# Patient Record
Sex: Female | Born: 1960 | Race: White | Hispanic: No | Marital: Married | Smoking: Former smoker
Health system: Southern US, Community
[De-identification: ages and names within clinical notes are randomized; demographics above are authoritative.]

## PROBLEM LIST (undated history)

## (undated) DIAGNOSIS — E785 Hyperlipidemia, unspecified: Secondary | ICD-10-CM

## (undated) DIAGNOSIS — D3A00 Benign carcinoid tumor of unspecified site: Secondary | ICD-10-CM

## (undated) DIAGNOSIS — Z87442 Personal history of urinary calculi: Secondary | ICD-10-CM

## (undated) DIAGNOSIS — T8859XA Other complications of anesthesia, initial encounter: Secondary | ICD-10-CM

## (undated) DIAGNOSIS — I10 Essential (primary) hypertension: Secondary | ICD-10-CM

## (undated) DIAGNOSIS — Z8719 Personal history of other diseases of the digestive system: Secondary | ICD-10-CM

## (undated) DIAGNOSIS — D509 Iron deficiency anemia, unspecified: Secondary | ICD-10-CM

## (undated) DIAGNOSIS — T4145XA Adverse effect of unspecified anesthetic, initial encounter: Secondary | ICD-10-CM

## (undated) DIAGNOSIS — K219 Gastro-esophageal reflux disease without esophagitis: Secondary | ICD-10-CM

## (undated) DIAGNOSIS — L237 Allergic contact dermatitis due to plants, except food: Secondary | ICD-10-CM

## (undated) DIAGNOSIS — K5730 Diverticulosis of large intestine without perforation or abscess without bleeding: Secondary | ICD-10-CM

## (undated) DIAGNOSIS — F419 Anxiety disorder, unspecified: Secondary | ICD-10-CM

## (undated) DIAGNOSIS — K5792 Diverticulitis of intestine, part unspecified, without perforation or abscess without bleeding: Secondary | ICD-10-CM

## (undated) HISTORY — PX: CHOLECYSTECTOMY: SHX55

## (undated) HISTORY — DX: Essential (primary) hypertension: I10

## (undated) HISTORY — PX: OTHER SURGICAL HISTORY: SHX169

## (undated) HISTORY — DX: Hyperlipidemia, unspecified: E78.5

## (undated) HISTORY — DX: Allergic contact dermatitis due to plants, except food: L23.7

## (undated) HISTORY — DX: Benign carcinoid tumor of unspecified site: D3A.00

## (undated) HISTORY — DX: Anxiety disorder, unspecified: F41.9

## (undated) HISTORY — DX: Iron deficiency anemia, unspecified: D50.9

## (undated) HISTORY — PX: ABDOMINAL HYSTERECTOMY: SHX81

## (undated) HISTORY — DX: Diverticulosis of large intestine without perforation or abscess without bleeding: K57.30

---

## 1998-05-08 ENCOUNTER — Encounter: Admission: RE | Admit: 1998-05-08 | Discharge: 1998-08-06 | Payer: Self-pay | Admitting: Gastroenterology

## 2001-08-09 ENCOUNTER — Other Ambulatory Visit: Admission: RE | Admit: 2001-08-09 | Discharge: 2001-08-09 | Payer: Self-pay | Admitting: Obstetrics and Gynecology

## 2001-08-09 ENCOUNTER — Encounter: Payer: Self-pay | Admitting: Obstetrics and Gynecology

## 2001-08-09 ENCOUNTER — Encounter: Admission: RE | Admit: 2001-08-09 | Discharge: 2001-08-09 | Payer: Self-pay | Admitting: Obstetrics and Gynecology

## 2001-11-08 ENCOUNTER — Encounter (INDEPENDENT_AMBULATORY_CARE_PROVIDER_SITE_OTHER): Payer: Self-pay

## 2001-11-08 ENCOUNTER — Observation Stay (HOSPITAL_COMMUNITY): Admission: RE | Admit: 2001-11-08 | Discharge: 2001-11-09 | Payer: Self-pay

## 2002-05-10 ENCOUNTER — Encounter: Payer: Self-pay | Admitting: Emergency Medicine

## 2002-05-10 ENCOUNTER — Emergency Department (HOSPITAL_COMMUNITY): Admission: EM | Admit: 2002-05-10 | Discharge: 2002-05-10 | Payer: Self-pay | Admitting: Emergency Medicine

## 2002-05-26 ENCOUNTER — Ambulatory Visit (HOSPITAL_COMMUNITY): Admission: RE | Admit: 2002-05-26 | Discharge: 2002-05-26 | Payer: Self-pay | Admitting: Internal Medicine

## 2002-05-26 ENCOUNTER — Encounter (INDEPENDENT_AMBULATORY_CARE_PROVIDER_SITE_OTHER): Payer: Self-pay | Admitting: Internal Medicine

## 2002-08-19 ENCOUNTER — Other Ambulatory Visit: Admission: RE | Admit: 2002-08-19 | Discharge: 2002-08-19 | Payer: Self-pay | Admitting: Obstetrics and Gynecology

## 2002-10-03 ENCOUNTER — Ambulatory Visit (HOSPITAL_COMMUNITY): Admission: RE | Admit: 2002-10-03 | Discharge: 2002-10-03 | Payer: Self-pay | Admitting: Pulmonary Disease

## 2003-01-16 ENCOUNTER — Ambulatory Visit (HOSPITAL_COMMUNITY): Admission: RE | Admit: 2003-01-16 | Discharge: 2003-01-16 | Payer: Self-pay | Admitting: Pulmonary Disease

## 2003-07-05 ENCOUNTER — Ambulatory Visit (HOSPITAL_COMMUNITY): Admission: RE | Admit: 2003-07-05 | Discharge: 2003-07-05 | Payer: Self-pay | Admitting: Pulmonary Disease

## 2003-07-05 ENCOUNTER — Encounter (INDEPENDENT_AMBULATORY_CARE_PROVIDER_SITE_OTHER): Payer: Self-pay | Admitting: Internal Medicine

## 2003-08-21 ENCOUNTER — Encounter (INDEPENDENT_AMBULATORY_CARE_PROVIDER_SITE_OTHER): Payer: Self-pay | Admitting: Internal Medicine

## 2003-09-01 ENCOUNTER — Encounter: Admission: RE | Admit: 2003-09-01 | Discharge: 2003-09-01 | Payer: Self-pay | Admitting: Cardiovascular Disease

## 2003-09-05 HISTORY — PX: CARDIAC CATHETERIZATION: SHX172

## 2003-10-30 ENCOUNTER — Encounter (INDEPENDENT_AMBULATORY_CARE_PROVIDER_SITE_OTHER): Payer: Self-pay | Admitting: Internal Medicine

## 2003-12-05 ENCOUNTER — Encounter (INDEPENDENT_AMBULATORY_CARE_PROVIDER_SITE_OTHER): Payer: Self-pay | Admitting: Internal Medicine

## 2003-12-12 ENCOUNTER — Emergency Department (HOSPITAL_COMMUNITY): Admission: EM | Admit: 2003-12-12 | Discharge: 2003-12-12 | Payer: Self-pay | Admitting: Emergency Medicine

## 2003-12-26 ENCOUNTER — Encounter (INDEPENDENT_AMBULATORY_CARE_PROVIDER_SITE_OTHER): Payer: Self-pay | Admitting: Internal Medicine

## 2004-01-24 ENCOUNTER — Encounter (INDEPENDENT_AMBULATORY_CARE_PROVIDER_SITE_OTHER): Payer: Self-pay | Admitting: Internal Medicine

## 2004-02-13 ENCOUNTER — Encounter (INDEPENDENT_AMBULATORY_CARE_PROVIDER_SITE_OTHER): Payer: Self-pay | Admitting: Internal Medicine

## 2004-02-22 ENCOUNTER — Encounter (INDEPENDENT_AMBULATORY_CARE_PROVIDER_SITE_OTHER): Payer: Self-pay | Admitting: Internal Medicine

## 2004-03-15 ENCOUNTER — Encounter (INDEPENDENT_AMBULATORY_CARE_PROVIDER_SITE_OTHER): Payer: Self-pay | Admitting: Internal Medicine

## 2004-04-15 ENCOUNTER — Ambulatory Visit (HOSPITAL_COMMUNITY): Admission: RE | Admit: 2004-04-15 | Discharge: 2004-04-15 | Payer: Self-pay | Admitting: Pulmonary Disease

## 2004-05-17 ENCOUNTER — Ambulatory Visit (HOSPITAL_COMMUNITY): Admission: RE | Admit: 2004-05-17 | Discharge: 2004-05-17 | Payer: Self-pay | Admitting: Pulmonary Disease

## 2004-05-17 ENCOUNTER — Encounter (INDEPENDENT_AMBULATORY_CARE_PROVIDER_SITE_OTHER): Payer: Self-pay | Admitting: Internal Medicine

## 2004-05-19 ENCOUNTER — Encounter (INDEPENDENT_AMBULATORY_CARE_PROVIDER_SITE_OTHER): Payer: Self-pay | Admitting: Internal Medicine

## 2004-07-02 ENCOUNTER — Encounter (INDEPENDENT_AMBULATORY_CARE_PROVIDER_SITE_OTHER): Payer: Self-pay | Admitting: Internal Medicine

## 2004-11-18 ENCOUNTER — Other Ambulatory Visit: Admission: RE | Admit: 2004-11-18 | Discharge: 2004-11-18 | Payer: Self-pay | Admitting: Obstetrics and Gynecology

## 2004-12-23 ENCOUNTER — Encounter (INDEPENDENT_AMBULATORY_CARE_PROVIDER_SITE_OTHER): Payer: Self-pay | Admitting: Internal Medicine

## 2004-12-23 ENCOUNTER — Ambulatory Visit (HOSPITAL_COMMUNITY): Admission: RE | Admit: 2004-12-23 | Discharge: 2004-12-23 | Payer: Self-pay | Admitting: Obstetrics and Gynecology

## 2005-02-11 ENCOUNTER — Ambulatory Visit (HOSPITAL_COMMUNITY): Admission: RE | Admit: 2005-02-11 | Discharge: 2005-02-11 | Payer: Self-pay | Admitting: Pulmonary Disease

## 2005-02-11 ENCOUNTER — Encounter (INDEPENDENT_AMBULATORY_CARE_PROVIDER_SITE_OTHER): Payer: Self-pay | Admitting: Internal Medicine

## 2005-02-17 ENCOUNTER — Ambulatory Visit: Payer: Self-pay | Admitting: Orthopedic Surgery

## 2005-10-09 ENCOUNTER — Ambulatory Visit: Payer: Self-pay | Admitting: Internal Medicine

## 2005-10-10 ENCOUNTER — Encounter (INDEPENDENT_AMBULATORY_CARE_PROVIDER_SITE_OTHER): Payer: Self-pay | Admitting: Internal Medicine

## 2005-10-14 ENCOUNTER — Encounter (INDEPENDENT_AMBULATORY_CARE_PROVIDER_SITE_OTHER): Payer: Self-pay | Admitting: Internal Medicine

## 2005-10-17 ENCOUNTER — Ambulatory Visit: Payer: Self-pay | Admitting: Internal Medicine

## 2005-11-21 ENCOUNTER — Ambulatory Visit: Payer: Self-pay | Admitting: Internal Medicine

## 2006-04-22 ENCOUNTER — Ambulatory Visit: Payer: Self-pay | Admitting: Internal Medicine

## 2006-04-29 ENCOUNTER — Encounter: Payer: Self-pay | Admitting: Internal Medicine

## 2006-04-29 DIAGNOSIS — K589 Irritable bowel syndrome without diarrhea: Secondary | ICD-10-CM

## 2006-04-29 DIAGNOSIS — F410 Panic disorder [episodic paroxysmal anxiety] without agoraphobia: Secondary | ICD-10-CM | POA: Insufficient documentation

## 2006-04-29 DIAGNOSIS — K219 Gastro-esophageal reflux disease without esophagitis: Secondary | ICD-10-CM | POA: Insufficient documentation

## 2006-04-29 DIAGNOSIS — E785 Hyperlipidemia, unspecified: Secondary | ICD-10-CM | POA: Insufficient documentation

## 2006-04-29 DIAGNOSIS — J309 Allergic rhinitis, unspecified: Secondary | ICD-10-CM | POA: Insufficient documentation

## 2006-04-29 DIAGNOSIS — N200 Calculus of kidney: Secondary | ICD-10-CM

## 2006-04-29 DIAGNOSIS — I10 Essential (primary) hypertension: Secondary | ICD-10-CM | POA: Insufficient documentation

## 2006-04-29 DIAGNOSIS — F411 Generalized anxiety disorder: Secondary | ICD-10-CM | POA: Insufficient documentation

## 2006-06-12 ENCOUNTER — Ambulatory Visit: Payer: Self-pay | Admitting: Internal Medicine

## 2006-07-02 ENCOUNTER — Ambulatory Visit: Payer: Self-pay | Admitting: Internal Medicine

## 2006-07-09 ENCOUNTER — Encounter (INDEPENDENT_AMBULATORY_CARE_PROVIDER_SITE_OTHER): Payer: Self-pay | Admitting: Internal Medicine

## 2007-01-07 ENCOUNTER — Telehealth (INDEPENDENT_AMBULATORY_CARE_PROVIDER_SITE_OTHER): Payer: Self-pay | Admitting: Internal Medicine

## 2007-01-07 ENCOUNTER — Telehealth (INDEPENDENT_AMBULATORY_CARE_PROVIDER_SITE_OTHER): Payer: Self-pay | Admitting: *Deleted

## 2007-01-07 ENCOUNTER — Ambulatory Visit: Payer: Self-pay | Admitting: Internal Medicine

## 2007-04-01 ENCOUNTER — Ambulatory Visit: Payer: Self-pay | Admitting: Internal Medicine

## 2007-05-19 ENCOUNTER — Ambulatory Visit: Payer: Self-pay | Admitting: Internal Medicine

## 2007-06-23 ENCOUNTER — Telehealth (INDEPENDENT_AMBULATORY_CARE_PROVIDER_SITE_OTHER): Payer: Self-pay | Admitting: *Deleted

## 2007-06-23 ENCOUNTER — Ambulatory Visit: Payer: Self-pay | Admitting: Internal Medicine

## 2007-06-23 ENCOUNTER — Ambulatory Visit (HOSPITAL_COMMUNITY): Admission: RE | Admit: 2007-06-23 | Discharge: 2007-06-23 | Payer: Self-pay | Admitting: Internal Medicine

## 2007-06-24 ENCOUNTER — Encounter (INDEPENDENT_AMBULATORY_CARE_PROVIDER_SITE_OTHER): Payer: Self-pay | Admitting: Internal Medicine

## 2007-07-16 ENCOUNTER — Telehealth (INDEPENDENT_AMBULATORY_CARE_PROVIDER_SITE_OTHER): Payer: Self-pay | Admitting: *Deleted

## 2007-07-26 ENCOUNTER — Encounter (INDEPENDENT_AMBULATORY_CARE_PROVIDER_SITE_OTHER): Payer: Self-pay | Admitting: Internal Medicine

## 2008-01-11 ENCOUNTER — Ambulatory Visit (HOSPITAL_COMMUNITY): Admission: RE | Admit: 2008-01-11 | Discharge: 2008-01-11 | Payer: Self-pay | Admitting: Internal Medicine

## 2008-01-11 ENCOUNTER — Ambulatory Visit: Payer: Self-pay | Admitting: Internal Medicine

## 2008-01-11 LAB — CONVERTED CEMR LAB
Basophils Absolute: 0 10*3/uL (ref 0.0–0.1)
Basophils Relative: 0 % (ref 0–1)
Bilirubin Urine: NEGATIVE
Chloride: 98 meq/L (ref 96–112)
Eosinophils Relative: 1 % (ref 0–5)
Glucose, Bld: 109 mg/dL — ABNORMAL HIGH (ref 70–99)
HCT: 37.9 % (ref 36.0–46.0)
Hemoglobin: 12.7 g/dL (ref 12.0–15.0)
Ketones, urine, test strip: NEGATIVE
Lymphocytes Relative: 28 % (ref 12–46)
Lymphs Abs: 2.7 10*3/uL (ref 0.7–4.0)
Monocytes Absolute: 0.9 10*3/uL (ref 0.1–1.0)
Monocytes Relative: 9 % (ref 3–12)
Neutro Abs: 5.9 10*3/uL (ref 1.7–7.7)
Neutrophils Relative %: 61 % (ref 43–77)
Potassium: 3.3 meq/L — ABNORMAL LOW (ref 3.5–5.3)
Protein, U semiquant: NEGATIVE
RDW: 14.1 % (ref 11.5–15.5)
Sodium: 137 meq/L (ref 135–145)
Total Bilirubin: 0.4 mg/dL (ref 0.3–1.2)
Total Protein: 6.9 g/dL (ref 6.0–8.3)

## 2008-01-12 ENCOUNTER — Telehealth (INDEPENDENT_AMBULATORY_CARE_PROVIDER_SITE_OTHER): Payer: Self-pay | Admitting: *Deleted

## 2008-02-08 ENCOUNTER — Telehealth (INDEPENDENT_AMBULATORY_CARE_PROVIDER_SITE_OTHER): Payer: Self-pay | Admitting: *Deleted

## 2008-02-17 ENCOUNTER — Ambulatory Visit: Payer: Self-pay | Admitting: Internal Medicine

## 2008-03-27 ENCOUNTER — Ambulatory Visit: Payer: Self-pay | Admitting: Internal Medicine

## 2008-04-20 ENCOUNTER — Ambulatory Visit: Payer: Self-pay | Admitting: Internal Medicine

## 2008-04-26 ENCOUNTER — Telehealth (INDEPENDENT_AMBULATORY_CARE_PROVIDER_SITE_OTHER): Payer: Self-pay | Admitting: *Deleted

## 2008-05-23 ENCOUNTER — Ambulatory Visit: Payer: Self-pay | Admitting: Family Medicine

## 2008-06-27 ENCOUNTER — Ambulatory Visit: Payer: Self-pay | Admitting: Family Medicine

## 2008-09-11 ENCOUNTER — Encounter (INDEPENDENT_AMBULATORY_CARE_PROVIDER_SITE_OTHER): Payer: Self-pay | Admitting: Internal Medicine

## 2008-10-10 HISTORY — PX: TRANSTHORACIC ECHOCARDIOGRAM: SHX275

## 2008-10-30 ENCOUNTER — Encounter (INDEPENDENT_AMBULATORY_CARE_PROVIDER_SITE_OTHER): Payer: Self-pay | Admitting: Internal Medicine

## 2008-12-28 ENCOUNTER — Encounter (INDEPENDENT_AMBULATORY_CARE_PROVIDER_SITE_OTHER): Payer: Self-pay | Admitting: Internal Medicine

## 2009-01-24 ENCOUNTER — Ambulatory Visit: Payer: Self-pay | Admitting: Internal Medicine

## 2009-01-24 DIAGNOSIS — M549 Dorsalgia, unspecified: Secondary | ICD-10-CM | POA: Insufficient documentation

## 2009-01-24 LAB — CONVERTED CEMR LAB
Glucose, Urine, Semiquant: NEGATIVE
Specific Gravity, Urine: 1.015
Urobilinogen, UA: 0.2
pH: 7.5

## 2009-03-30 HISTORY — PX: OTHER SURGICAL HISTORY: SHX169

## 2009-04-02 ENCOUNTER — Encounter: Payer: Self-pay | Admitting: Family Medicine

## 2009-05-08 ENCOUNTER — Ambulatory Visit: Payer: Self-pay | Admitting: Family Medicine

## 2009-05-08 DIAGNOSIS — M542 Cervicalgia: Secondary | ICD-10-CM | POA: Insufficient documentation

## 2009-05-09 ENCOUNTER — Telehealth: Payer: Self-pay | Admitting: Family Medicine

## 2009-05-15 ENCOUNTER — Ambulatory Visit: Payer: Self-pay | Admitting: Family Medicine

## 2009-05-15 ENCOUNTER — Telehealth: Payer: Self-pay | Admitting: Family Medicine

## 2009-05-15 DIAGNOSIS — N63 Unspecified lump in unspecified breast: Secondary | ICD-10-CM | POA: Insufficient documentation

## 2009-05-17 ENCOUNTER — Encounter: Payer: Self-pay | Admitting: Family Medicine

## 2009-05-17 ENCOUNTER — Telehealth: Payer: Self-pay | Admitting: Family Medicine

## 2009-05-17 ENCOUNTER — Encounter: Admission: RE | Admit: 2009-05-17 | Discharge: 2009-05-17 | Payer: Self-pay | Admitting: Family Medicine

## 2009-07-23 ENCOUNTER — Encounter: Payer: Self-pay | Admitting: Physician Assistant

## 2009-07-23 ENCOUNTER — Ambulatory Visit: Payer: Self-pay | Admitting: Family Medicine

## 2009-07-23 DIAGNOSIS — J209 Acute bronchitis, unspecified: Secondary | ICD-10-CM | POA: Insufficient documentation

## 2009-07-23 DIAGNOSIS — J0100 Acute maxillary sinusitis, unspecified: Secondary | ICD-10-CM

## 2009-09-17 ENCOUNTER — Telehealth: Payer: Self-pay | Admitting: Family Medicine

## 2009-09-27 ENCOUNTER — Ambulatory Visit: Payer: Self-pay | Admitting: Family Medicine

## 2009-09-27 ENCOUNTER — Encounter: Payer: Self-pay | Admitting: Physician Assistant

## 2009-09-27 DIAGNOSIS — K5289 Other specified noninfective gastroenteritis and colitis: Secondary | ICD-10-CM | POA: Insufficient documentation

## 2009-10-03 ENCOUNTER — Telehealth: Payer: Self-pay | Admitting: Physician Assistant

## 2009-11-27 ENCOUNTER — Encounter: Admission: RE | Admit: 2009-11-27 | Discharge: 2009-11-27 | Payer: Self-pay | Admitting: Family Medicine

## 2010-01-17 ENCOUNTER — Encounter: Payer: Self-pay | Admitting: Family Medicine

## 2010-01-17 ENCOUNTER — Telehealth: Payer: Self-pay | Admitting: Physician Assistant

## 2010-04-02 ENCOUNTER — Encounter: Payer: Self-pay | Admitting: Family Medicine

## 2010-04-08 ENCOUNTER — Telehealth: Payer: Self-pay | Admitting: Family Medicine

## 2010-04-18 ENCOUNTER — Encounter: Payer: Self-pay | Admitting: Family Medicine

## 2010-05-02 ENCOUNTER — Encounter: Payer: Self-pay | Admitting: Family Medicine

## 2010-05-02 ENCOUNTER — Ambulatory Visit: Payer: Self-pay | Admitting: Family Medicine

## 2010-05-02 DIAGNOSIS — R079 Chest pain, unspecified: Secondary | ICD-10-CM

## 2010-05-03 ENCOUNTER — Encounter: Payer: Self-pay | Admitting: Family Medicine

## 2010-05-06 ENCOUNTER — Telehealth: Payer: Self-pay | Admitting: Family Medicine

## 2010-05-06 ENCOUNTER — Encounter: Payer: Self-pay | Admitting: Family Medicine

## 2010-05-07 ENCOUNTER — Encounter: Payer: Self-pay | Admitting: Family Medicine

## 2010-05-07 LAB — CONVERTED CEMR LAB
ALT: 17 units/L (ref 0–35)
Albumin: 4.5 g/dL (ref 3.5–5.2)
Alkaline Phosphatase: 80 units/L (ref 39–117)
Basophils Absolute: 0 10*3/uL (ref 0.0–0.1)
Basophils Relative: 0 % (ref 0–1)
Cholesterol: 197 mg/dL (ref 0–200)
Eosinophils Absolute: 0.1 10*3/uL (ref 0.0–0.7)
Eosinophils Relative: 1 % (ref 0–5)
Glucose, Bld: 102 mg/dL — ABNORMAL HIGH (ref 70–99)
HCT: 38.3 % (ref 36.0–46.0)
Helicobacter Pylori Antibody-IgG: <0.4
Indirect Bilirubin: 0.4 mg/dL (ref 0.0–0.9)
MCV: 86.7 fL (ref 78.0–100.0)
Monocytes Relative: 9 % (ref 3–12)
Neutro Abs: 5 10*3/uL (ref 1.7–7.7)
Neutrophils Relative %: 53 % (ref 43–77)
Platelets: 389 10*3/uL (ref 150–400)
RDW: 14.1 % (ref 11.5–15.5)
Sodium: 140 meq/L (ref 135–145)
TSH: 2.084 microintl units/mL (ref 0.350–4.500)
Total Bilirubin: 0.5 mg/dL (ref 0.3–1.2)
Total Protein: 6.7 g/dL (ref 6.0–8.3)
VLDL: 35 mg/dL (ref 0–40)
WBC: 9.5 10*3/uL (ref 4.0–10.5)

## 2010-05-08 ENCOUNTER — Encounter: Payer: Self-pay | Admitting: Family Medicine

## 2010-05-08 LAB — CONVERTED CEMR LAB: Hgb A1c MFr Bld: 6.3 % — ABNORMAL HIGH (ref <5.7)

## 2010-05-17 ENCOUNTER — Telehealth: Payer: Self-pay | Admitting: Family Medicine

## 2010-05-19 DIAGNOSIS — K5730 Diverticulosis of large intestine without perforation or abscess without bleeding: Secondary | ICD-10-CM

## 2010-05-19 DIAGNOSIS — L237 Allergic contact dermatitis due to plants, except food: Secondary | ICD-10-CM

## 2010-05-19 HISTORY — DX: Allergic contact dermatitis due to plants, except food: L23.7

## 2010-05-19 HISTORY — DX: Diverticulosis of large intestine without perforation or abscess without bleeding: K57.30

## 2010-05-21 ENCOUNTER — Ambulatory Visit (HOSPITAL_COMMUNITY)
Admission: RE | Admit: 2010-05-21 | Discharge: 2010-05-21 | Payer: Self-pay | Source: Home / Self Care | Attending: Family Medicine | Admitting: Family Medicine

## 2010-06-09 ENCOUNTER — Encounter: Payer: Self-pay | Admitting: Family Medicine

## 2010-06-13 ENCOUNTER — Ambulatory Visit
Admission: RE | Admit: 2010-06-13 | Discharge: 2010-06-13 | Payer: Self-pay | Source: Home / Self Care | Attending: Family Medicine | Admitting: Family Medicine

## 2010-06-13 DIAGNOSIS — J40 Bronchitis, not specified as acute or chronic: Secondary | ICD-10-CM | POA: Insufficient documentation

## 2010-06-16 LAB — CONVERTED CEMR LAB: WBC, blood: 7.1 10*3/uL

## 2010-06-20 NOTE — Letter (Signed)
Summary: LAB ADD ON  LAB ADD ON   Imported By: Lind Guest 05/08/2010 08:39:55  _____________________________________________________________________  External Attachment:    Type:   Image     Comment:   External Document

## 2010-06-20 NOTE — Progress Notes (Signed)
  Phone Note Other Incoming   Caller: dr simpson Summary of Call: pls let pt know EKG shows no significant change from before, she does NOT NEED to to see the cardiologist Initial call taken by: Syliva Overman MD,  May 06, 2010 9:19 AM  Follow-up for Phone Call        left patient detailed message Follow-up by: Adella Hare LPN,  May 07, 2010 10:27 AM

## 2010-06-20 NOTE — Assessment & Plan Note (Signed)
Summary: sick- room 3   Vital Signs:  Patient profile:   50 year old female Height:      62 inches Weight:      225 pounds BMI:     41.30 O2 Sat:      96 % on Room air Pulse rate:   86 / minute Resp:     16 per minute BP sitting:   120 / 70  (left arm)  Vitals Entered By: Adella Hare LPN (Sep 27, 2009 8:46 AM) CC: body aches, headache, fever, nausea, diarrhea Is Patient Diabetic? No Pain Assessment Patient in pain? no      Comments did not bring medication to ov   Primary Provider:  Syliva Overman MD  CC:  body aches, headache, fever, nausea, and diarrhea.  History of Present Illness: Pt is here today with c/o nausea, fever, and headache.  Was a little nauseated on Mon 5/9 but went away  until Tues night then HA started and nausea returned.  Was up most of the night due to nausea.  No vomiting.  Has been running a low grade temp 99 - 100.4.  Body aches. Stools loose.  Taking Ibuprofen for HA and body aches.  Not much help with Headache on Tues night.    Allergies (verified): No Known Drug Allergies  Past History:  Past medical history reviewed for relevance to current acute and chronic problems.  Past Medical History: Reviewed history from 05/08/2009 and no changes required. Allergic rhinitis  was on immunotherapy Anxiety GERD Hyperlipidemia Hypertension nephrolithiasis depression in the pasrt Obesity Back pain with sciatica  2009  Review of Systems General:  Complains of fever; denies chills. ENT:  Denies earache, nasal congestion, and sore throat. Resp:  Denies cough and shortness of breath. GI:  Complains of diarrhea, loss of appetite, and nausea; denies abdominal pain, bloody stools, dark tarry stools, and vomiting. GU:  Denies dysuria and urinary frequency.  Physical Exam  General:  Well-developed,well-nourished,in no acute distress; alert,appropriate and cooperative throughout examination Head:  Normocephalic and atraumatic without obvious  abnormalities. No apparent alopecia or balding. Ears:  External ear exam shows no significant lesions or deformities.  Otoscopic examination reveals clear canals, tympanic membranes are intact bilaterally without bulging, retraction, inflammation or discharge. Hearing is grossly normal bilaterally. Nose:  External nasal examination shows no deformity or inflammation. Nasal mucosa are pink and moist without lesions or exudates. Mouth:  Oral mucosa and oropharynx without lesions or exudates.  Teeth in good repair. Neck:  No deformities, masses, or tenderness noted. Lungs:  Normal respiratory effort, chest expands symmetrically. Lungs are clear to auscultation, no crackles or wheezes. Heart:  Normal rate and regular rhythm. S1 and S2 normal without gallop, murmur, click, rub or other extra sounds. Abdomen:  No BS x 4 .  Abd soft, no masses, no HSM.  Pt does report TTP bilat LQ.  No guarding, rebound, or rigidity.    Impression & Recommendations:  Problem # 1:  GASTROENTERITIS, ACUTE (ICD-558.9) Assessment New  Orders: Zofran 1mg . injection (Z6109) Admin of Therapeutic Inj  intramuscular or subcutaneous (60454)  Complete Medication List: 1)  Diovan 160 Mg Tabs (Valsartan) .... Once daily 2)  Nexium 40 Mg Pack (Esomeprazole magnesium) .Marland Kitchen.. 1 by mouth every morning 3)  Aspirin 81 Mg Tbec (Aspirin) .... Once daily 4)  Lipitor 10 Mg Tabs (Atorvastatin calcium) .... Once daily 5)  Folic Acid 1 Mg Tabs (Folic acid) .... One tab by mouth qhs 6)  Ibuprofen 800 Mg  Tabs (Ibuprofen) .... Take 1 tablet by mouth three times a day 7)  Cyclobenzaprine Hcl 10 Mg Tabs (Cyclobenzaprine hcl) .... Take 1 tab by mouth at bedtime as needed 8)  Alprazolam 0.25 Mg Tabs (Alprazolam) .... Two times a day as needed 9)  Phentermine Hcl 37.5 Mg Caps (Phentermine hcl) .... Take 1 tablet by mouth once a day  (take 1/2 tab once daily ) 10)  Zithromax Z-pak 250 Mg Tabs (Azithromycin) .... As directed 11)   Promethazine-codeine 6.25-10 Mg/58ml Syrp (Promethazine-codeine) .Marland Kitchen.. 1-2 tsp q 6 hrs as needed cough 12)  Promethazine Hcl 25 Mg Tabs (Promethazine hcl) .... Take 1 every 6- 8 hrs as needed for nausea 13)  Tramadol Hcl 50 Mg Tabs (Tramadol hcl) .... Take 1 every 6 hrs as needed for headache  Patient Instructions: 1)  Please schedule a follow-up appointment as needed. 2)  Watch for increased fever, abdominal pain, or other worsening of symptoms.  If this develops please notify us right away. 3)  Increase clear fluids to avoid dehydration. 4)  You have received a shot of Zofran today for nausea. 5)  I have prescribed Phenergan to use at home as needed for nausea. 6)  I have prescribed Tramadol for headaches and body aches.  Continue Ibuprofen and use this in addition if needed. Prescriptions: TRAMADOL HCL 50 MG TABS (TRAMADOL HCL) take 1 every 6 hrs as needed for Headache  #20 x 0   Entered and Authorized by:   Esperanza Sheets PA   Signed by:   Esperanza Sheets PA on 09/27/2009   Method used:   Electronically to        [facility] [phone]* (retail)       [address]       [place], [place]  [address]       Ph: [phone] or [phone]       Fax: [phone]   RxID:   [id number] PROMETHAZINE HCL 25 MG TABS (PROMETHAZINE HCL) take 1 every 6- 8 hrs as needed for nausea  #20 x 0   Entered and Authorized by:   Esperanza Sheets PA   Signed by:   Esperanza Sheets PA on 09/27/2009   Method used:   Electronically to        [facility] [phone]* (retail)       [address]       [place], [place]  [address]       Ph: [phone] or [phone]       Fax: [phone]   RxID:   [id number]    Medication Administration  Injection # 1:    Medication: Zofran 1mg . injection    Diagnosis: GASTROENTERITIS, ACUTE (ICD-558.9)    Route: IM    Site: RUOQ gluteus    Exp Date: 10/12    Lot #: [id number]    Mfr: baxter    Comments: 4mg  given     Patient tolerated  injection without complications    Given by: Everitt Amber LPN (Sep 27, 2009 9:34 AM)  Orders Added: 1)  Zofran 1mg . injection [J2405] 2)  Admin of Therapeutic Inj  intramuscular or subcutaneous [96372] 3)  Est. Patient Level IV [31517]

## 2010-06-20 NOTE — Letter (Signed)
Summary: Out of Work  [facility]  [address]   [place], [place] [address]   Phone: [phone]  Fax: [phone]    July 23, 2009   Employee:  TIRA LAFFERTY    To Whom It May Concern:   For Medical reasons, please excuse the above named employee from work for the following dates:  Start:   07/23/09  End:   07/26/09 may return to work without restriction.  If you need additional information, please feel free to contact our office.         Sincerely,    Esperanza Sheets PA

## 2010-06-20 NOTE — Progress Notes (Signed)
Summary: MEDICINE  Phone Note Call from Patient   Summary of Call: NEEDS A RX FOR HER BP MEDICINE DIOVAN W- HCTZ 160-12.5. She takes one tab daily.  [facility] has a "half-tab" program where she can get her meds Diovan 320/25 1/2 tab daily (90 day supply) and it saves them money.  If you would be willing to write it like that just let me know and I can fax [facility] to [facility] for her. Thanks CALL [facility] BACK AT [phone]  POUND 123  Initial call taken by: Lind Guest,  January 17, 2010 8:59 AM  Follow-up for Phone Call        It is OK to make this change.  Rx #45 tabs take 1/2 tab once daily for blood pressure. No rf.  Pt needs f/u appt for BP check etc. in Sept. Follow-up by: Esperanza Sheets PA,  January 17, 2010 2:22 PM  Additional Follow-up for Phone Call Additional follow up Details #1::        Faxed in  Additional Follow-up by: Everitt Amber LPN,  January 17, 2010 2:49 PM    New/Updated Medications: DIOVAN HCT 320-25 MG TABS (VALSARTAN-HYDROCHLOROTHIAZIDE) Take one-half tab once daily Prescriptions: DIOVAN HCT 320-25 MG TABS (VALSARTAN-HYDROCHLOROTHIAZIDE) Take one-half tab once daily  #45 x 0   Entered by:   Everitt Amber LPN   Authorized by:   Esperanza Sheets PA   Signed by:   Everitt Amber LPN on 87/56/4332   Method used:   Faxed to ...       [facility] [employer] education [facility])       [address] [address]       [place], [place]  [address]       Ph: [phone]       Fax: [phone]   RxID:   [id number]

## 2010-06-20 NOTE — Letter (Signed)
Summary: MEDICAL RELEASE  MEDICAL RELEASE   Imported By: Lind Guest 01/17/2010 13:59:25  _____________________________________________________________________  External Attachment:    Type:   Image     Comment:   External Document

## 2010-06-20 NOTE — Letter (Signed)
[facility]   Imported By: Lind Guest 05/06/2010 09:19:02  _____________________________________________________________________  External Attachment:    Type:   Image     Comment:   External Document

## 2010-06-20 NOTE — Progress Notes (Signed)
  Phone Note Call from Patient   Caller: Patient Summary of Call: patient called and states she is on diovan hctz 12.5 but only plain diovan was sent in  which is what is on her med list  wants to know if we can send in the hctz Initial call taken by: Adella Hare LPN,  Oct 03, 2009 4:32 PM  Follow-up for Phone Call        Rx Called In Follow-up by: Esperanza Sheets PA,  Oct 03, 2009 5:43 PM    New/Updated Medications: HYDROCHLOROTHIAZIDE 12.5 MG CAPS (HYDROCHLOROTHIAZIDE) take 1 daily Prescriptions: HYDROCHLOROTHIAZIDE 12.5 MG CAPS (HYDROCHLOROTHIAZIDE) take 1 daily  #90 x 1   Entered and Authorized by:   Esperanza Sheets PA   Signed by:   Esperanza Sheets PA on 10/03/2009   Method used:   Electronically to        [facility] [phone]* (retail)       [address]       [place], [place]  [address]       Ph: [phone] or [phone]       Fax: [phone]   RxID:   [id number]

## 2010-06-20 NOTE — Progress Notes (Signed)
Summary: MEDICINE  Phone Note Call from Patient   Summary of Call: DR TOLD HER THAT SHE DO HER DIVAN AND NEXIUM MEDICINE SHE WANTS RX FOR THESE AND ALSO THE APPT. PILL ANOTHER 30 DAY SUPPLY THE BP AND NEXIUM 90 DAY SUPPY WANTS TO PICK UP RX SHE MAILS THEM OFF CALL BACK AT [phone] EXT. # [phone] OR CELL # [phone] AND LEAVE MESSAGE IF NO ONE ANSWERS Initial call taken by: Lind Guest,  Sep 17, 2009 10:41 AM  Follow-up for Phone Call        returned call, unavailable Follow-up by: Adella Hare LPN,  Sep 17, 1608 12:09 PM  Additional Follow-up for Phone Call Additional follow up Details #1::        available for pick up, patient aware Additional Follow-up by: Adella Hare LPN,  Sep 18, 9602 1:32 PM    Prescriptions: NEXIUM 40 MG PACK (ESOMEPRAZOLE MAGNESIUM) 1 by mouth every morning  #90 x 1   Entered by:   Adella Hare LPN   Authorized by:   Syliva Overman MD   Signed by:   Adella Hare LPN on 54/01/8118   Method used:   Printed then faxed to ...       [address] [phone]* (retail)       [address]       [place], [place]  [address]       Ph: [phone] or [phone]       Fax: [phone]   RxID:   [id number] DIOVAN 160 MG TABS (VALSARTAN) once daily  #90 x 1   Entered by:   Adella Hare LPN   Authorized by:   Syliva Overman MD   Signed by:   Adella Hare LPN on 87/56/4332   Method used:   Printed then faxed to ...       [address] [phone]* (retail)       [address]       [place], [place]  [address]       Ph: [phone] or [phone]       Fax: [phone]   RxID:   [id number]

## 2010-06-20 NOTE — Progress Notes (Signed)
Summary: medication  Phone Note Call from Patient   Summary of Call: patient request refill for Nexium 40mg  and Diovin W/hctz 320/25mg  it is a 90 day prescription pt stated that she is out please call her when it is filled call cell [phone] leave message would like to put refills on them so will not be worring [place] Initial call taken by: Eugenio Hoes,  April 08, 2010 8:51 AM  Follow-up for Phone Call        Rx Called In, left message Follow-up by: Adella Hare LPN,  April 09, 2010 2:39 PM    Prescriptions: DIOVAN HCT 320-25 MG TABS (VALSARTAN-HYDROCHLOROTHIAZIDE) Take one-half tab once daily  #45 x 0   Entered by:   Adella Hare LPN   Authorized by:   Syliva Overman MD   Signed by:   Adella Hare LPN on 81/19/1478   Method used:   Electronically to        [facility]* (mail-order)             , [place]         Ph: [phone]       Fax: [phone]   RxID:   [id number] NEXIUM 40 MG CPDR (ESOMEPRAZOLE MAGNESIUM) 1 tab q am  #90 x 0   Entered by:   Adella Hare LPN   Authorized by:   Syliva Overman MD   Signed by:   Adella Hare LPN on 66/44/0347   Method used:   Electronically to        [facility]* (mail-order)             , [place]         Ph: [phone]       Fax: [phone]   RxID:   [id number]

## 2010-06-20 NOTE — Letter (Signed)
Summary: MEDICAL RELEASE  MEDICAL RELEASE   Imported By: Lind Guest 05/03/2010 08:33:34  _____________________________________________________________________  External Attachment:    Type:   Image     Comment:   External Document

## 2010-06-20 NOTE — Assessment & Plan Note (Signed)
Summary: OV   Vital Signs:  Patient profile:   50 year old female Height:      62 inches Weight:      228.50 pounds O2 Sat:      96 % on Room air Pulse rate:   73 / minute Pulse rhythm:   regular BP sitting:   120 / 82  (right arm)  Vitals Entered By: Adella Hare LPN (May 02, 2010 1:59 PM)  O2 Flow:  Room air   Primary Care Jaylanni Eltringham:  Syliva Overman MD   History of Present Illness: Reports  that she has not been doing very well. Denies recent fever or chills. Denies sinus pressure, nasal congestion , ear pain or sore throat. Denies chest congestion, or cough productive of sputum.  Denies abdominal pain, nausea, vomitting, diarrhea or constipation. Denies change in bowel movements or bloody stool. Denies dysuria , frequency, incontinence or hesitancy. Denies  joint pain, swelling, or reduced mobility. Denies headaches, vertigo, seizures. Denies depression,  Denies  rash, lesions, or itch.     Allergies: No Known Drug Allergies  Review of Systems      See HPI General:  Complains of fatigue. Eyes:  Denies blurring and discharge. CV:  Complains of chest pain or discomfort; denies difficulty breathing while lying down, lightheadness, near fainting, shortness of breath with exertion, and swelling of feet; intermiitent left chest tightness primarily  associated with stress which is overwhelming, has had cardiology eval in recent past, less than 2 yrs ago . GI:  Complains of abdominal pain, gas, and indigestion; epiagastric pain inc in the past 2 months uncontrolled heartburn despite nexium has tried multiple otherinc aciphex, prilosec and nexium current, has had upper endo more than once. Psych:  Complains of anxiety; denies depression, suicidal thoughts/plans, thoughts of violence, and unusual visions or sounds; overwhelmed with workload, stressed. Endo:  Denies cold intolerance, excessive hunger, excessive thirst, excessive urination, and heat intolerance. Heme:   Denies abnormal bruising and bleeding. Allergy:  Denies hives or rash and itching eyes.  Physical Exam  General:  Well-developed,well-nourished,in no acute distress; alert,appropriate and cooperative throughout examination HEENT: No facial asymmetry,  EOMI, No sinus tenderness, TM's Clear, oropharynx  pink and moist.   Chest: Clear to auscultation bilaterally.  CVS: S1, S2, No murmurs, No S3.   Abd: Soft, Nontender.  MS: Adequate ROM spine, hips, shoulders and knees.  Ext: No edema.   CNS: CN 2-12 intact, power tone and sensation normal throughout.   Skin: Intact, no visible lesions or rashes.  Psych: Good eye contact, normal affect.  Memory intact, anxious not depressed appearing.    Impression & Recommendations:  Problem # 1:  CHEST PAIN UNSPECIFIED (ICD-786.50) Assessment Comment Only  Orders: EKG w/ Interpretation (93000)NSR no significasnt  ischemia , NSR, will have card to look at it  Problem # 2:  HYPERTENSION (ICD-401.9) Assessment: Unchanged  Her updated medication list for this problem includes:    Diovan Hct 320-25 Mg Tabs (Valsartan-hydrochlorothiazide) .Marland Kitchen... Take one-half tab once daily  Orders: T-Basic Metabolic Panel [phone])  BP today: 120/82 Prior BP: 120/70 (09/27/2009)  Prior 10 Yr Risk Heart Disease: Not enough information (05/23/2008)  Labs Reviewed: K+: 3.3 (01/11/2008) Creat: : 0.57 (01/11/2008)     Problem # 3:  MORBID OBESITY (ICD-278.01) Assessment: Unchanged  Ht: 62 (05/02/2010)   Wt: 228.50 (05/02/2010)   BMI: 41.30 (09/27/2009) therapeutic lifestyle change discussed and encouraged  Problem # 4:  ANXIETY (ICD-300.00) Assessment: Deteriorated  Her updated medication list for  this problem includes:    Alprazolam 0.25 Mg Tabs (Alprazolam) .Marland Kitchen..Marland Kitchen Two times a day as needed  Discussed medication use and relaxation techniques.   Complete Medication List: 1)  Aspirin 81 Mg Tbec (Aspirin) .... Once daily 2)  Lipitor 10 Mg Tabs  (Atorvastatin calcium) .... Once daily 3)  Folic Acid 1 Mg Tabs (Folic acid) .... One tab by mouth qhs 4)  Ibuprofen 800 Mg Tabs (Ibuprofen) .... Take 1 tablet by mouth three times a day 5)  Cyclobenzaprine Hcl 10 Mg Tabs (Cyclobenzaprine hcl) .... Take 1 tab by mouth at bedtime as needed 6)  Alprazolam 0.25 Mg Tabs (Alprazolam) .... Two times a day as needed 7)  Phentermine Hcl 37.5 Mg Caps (Phentermine hcl) .... Take 1 tablet by mouth once a day  (take 1/2 tab once daily ) 8)  Promethazine-codeine 6.25-10 Mg/23ml Syrp (Promethazine-codeine) .Marland Kitchen.. 1-2 tsp q 6 hrs as needed cough 9)  Promethazine Hcl 25 Mg Tabs (Promethazine hcl) .... Take 1 every 6- 8 hrs as needed for nausea 10)  Tramadol Hcl 50 Mg Tabs (Tramadol hcl) .... Take 1 every 6 hrs as needed for headache 11)  Diovan Hct 320-25 Mg Tabs (Valsartan-hydrochlorothiazide) .... Take one-half tab once daily 12)  Dexilant 60 Mg Cpdr (Dexlansoprazole) .... Take 1 capsule by mouth once a day  Other Orders: T-Lipid Profile ([address]-60454) T-Hepatic Function [phone]) T-CBC w/Diff (29562-13086) T-TSH (57846-96295) TLB-H. Pylori Abs(Helicobacter Pylori) (86677-HELICO) Radiology Referral (Radiology)  Patient Instructions: 1)  Please schedule a follow-up appointment in 4 months. 2)  It is important that you exercise regularly at least 30 minutes 5 times a week. If you develop chest pain, have severe difficulty breathing, or feel very tired , stop exercising immediately and seek medical attention. 3)  You need to lose weight. Consider a lower calorie diet and regular exercise.  4)  BMP prior to visit, ICD-9: 5)  Hepatic Panel prior to visit, ICD-9: 6)  Lipid Panel prior to visit, ICD-9:  FASTING PAST DUE 7)  TSH prior to visit, ICD-9: 8)  CBC w/ Diff prior to visit, ICD-9: 9)  H pylori 10)  mAMO  DUE WE WILL SCHED 11)  Stop nexium and try dexilant one daily for gERD Prescriptions: DEXILANT 60 MG CPDR (DEXLANSOPRAZOLE) Take 1 capsule by  mouth once a day  #30 x 0   Entered by:   Adella Hare LPN   Authorized by:   Syliva Overman MD   Signed by:   Adella Hare LPN on 28/41/3244   Method used:   Electronically to        [facility] [phone]* (retail)       [address]       [place], [place]  [address]       Ph: [phone] or [phone]       Fax: [phone]   RxID:   [id number] DEXILANT 60 MG CPDR (DEXLANSOPRAZOLE) Take 1 capsule by mouth once a day  #14 x 0   Entered and Authorized by:   Syliva Overman MD   Signed by:   Syliva Overman MD on 05/02/2010   Method used:   Print then Give to Patient   RxID:   [id number]    Orders Added: 1)  Est. Patient Level IV [[address]315] 2)  EKG w/ Interpretation [93000] 3)  T-Basic Metabolic Panel [80048-22910] 4)  T-Lipid Profile [80061-22930] 5)  T-Hepatic Function [80076-22960] 6)  T-CBC w/Diff [[id number]] 7)  T-TSH [06269-48546]  8)  TLB-H. Pylori Abs(Helicobacter Pylori) [86677-HELICO] 9)  Radiology Referral [Radiology]

## 2010-06-20 NOTE — Progress Notes (Signed)
  Phone Note Call from Patient   Caller: Patient Summary of Call: requesting refill on phentermine Initial call taken by: Adella Hare LPN,  Sep 18, 1306 1:32 PM  Follow-up for Phone Call        needs ov  Follow-up by: Syliva Overman MD,  Sep 17, 2009 3:53 PM  Additional Follow-up for Phone Call Additional follow up Details #1::        called patient, left detailed message Additional Follow-up by: Adella Hare LPN,  Sep 17, 6576 4:04 PM

## 2010-06-20 NOTE — Progress Notes (Signed)
Summary: EKG RESULTS  Phone Note Call from Patient   Summary of Call: PATIENT CALLED WANTED TO KNOW THE RESULT OF EKG AND DID YOU GET IN CONTACT WITH THE CARDIOLOGY PLEASE CALLED PT BACK AT [phone] EXT # [phone] # Initial call taken by: Eugenio Hoes,  May 06, 2010 8:49 AM  Follow-up for Phone Call        see msg already sent Follow-up by: Syliva Overman MD,  May 06, 2010 6:48 PM

## 2010-06-20 NOTE — Letter (Signed)
Summary: medical release  medical release   Imported By: Lind Guest 05/06/2010 09:19:22  _____________________________________________________________________  External Attachment:    Type:   Image     Comment:   External Document

## 2010-06-20 NOTE — Assessment & Plan Note (Signed)
Summary: sick - room 3   Vital Signs:  Patient profile:   50 year old female Height:      62 inches Weight:      226.75 pounds BMI:     41.62 O2 Sat:      98 % on Room air Pulse rate:   97 / minute Resp:     16 per minute BP sitting:   140 / 88  (left arm)  Vitals Entered By: Adella Hare LPN (July 23, 1608 9:02 AM) CC: chest congestion, body aches, fever, chills x 2 days Is Patient Diabetic? No Pain Assessment Patient in pain? no        Primary Provider:  Syliva Overman MD  CC:  chest congestion, body aches, fever, and chills x 2 days.  History of Present Illness: Pt states she developed yesterday a cough & "funny feeling" in her throat.  Today syptoms have worsened.  She is running a low grade temp, body aches, some nasal & sinus congestion & her cough is now sometimes prod with thick yellow phlegm.  She is a nonsmoker.  She has been taking OTC fever reducers, & did try some OTC cough med last night which didn't work well.    Current Medications (verified): 1)  Diovan 160 Mg Tabs (Valsartan) .... Once Daily 2)  Nexium 40 Mg Pack (Esomeprazole Magnesium) .Marland Kitchen.. 1 By Mouth Every Morning 3)  Aspirin 81 Mg Tbec (Aspirin) .... Once Daily 4)  Lipitor 10 Mg Tabs (Atorvastatin Calcium) .... Once Daily 5)  Folic Acid 1 Mg Tabs (Folic Acid) .... One Tab By Mouth Qhs 6)  Ibuprofen 800 Mg Tabs (Ibuprofen) .... Take 1 Tablet By Mouth Three Times A Day 7)  Cyclobenzaprine Hcl 10 Mg Tabs (Cyclobenzaprine Hcl) .... Take 1 Tab By Mouth At Bedtime As Needed 8)  Alprazolam 0.25 Mg Tabs (Alprazolam) .... Two Times A Day As Needed 9)  Phentermine Hcl 37.5 Mg Caps (Phentermine Hcl) .... Take 1 Tablet By Mouth Once A Day  (Take 1/2 Tab Once Daily )  Allergies (verified): No Known Drug Allergies  Past History:  Past medical history reviewed for relevance to current acute and chronic problems.  Past Medical History: Reviewed history from 05/08/2009 and no changes required. 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 ENT:  Complains of earache, nasal congestion, postnasal drainage, and sinus pressure; denies sore throat. CV:  Denies chest pain or discomfort and palpitations. Resp:  Complains of cough and sputum productive; denies shortness of breath and wheezing. GI:  Complains of loss of appetite; denies nausea and vomiting. MS:  Complains of muscle aches. Heme:  Denies enlarge lymph nodes.  Physical Exam  General:  Well-developed,well-nourished,in no acute distress; alert,appropriate and cooperative throughout examination Head:  Normocephalic and atraumatic without obvious abnormalities. No apparent alopecia or balding. Ears:  External ear exam shows no significant lesions or deformities.  Otoscopic examination reveals clear canals, tympanic membranes are intact bilaterally without bulging, retraction, inflammation or discharge. Hearing is grossly normal bilaterally. Nose:  External nasal examination shows no deformity or inflammation. Nasal mucosa are pink and moist without lesions or exudates.no sinus percussion tenderness.   Mouth:  Oral mucosa and oropharynx without lesions or exudates.  Teeth in good repair. Neck:  No deformities, masses, or tenderness noted. Lungs:  normal respiratory effort.  Coarse  BS bilat bases.  No rales or wheeze. Heart:  Normal rate and regular rhythm. S1 and S2 normal without gallop, murmur, click, rub or other extra sounds. Cervical Nodes:  No lymphadenopathy noted Psych:  Cognition and judgment appear intact. Alert and cooperative with normal attention span and concentration. No apparent delusions, illusions, hallucinations   Impression & Recommendations:  Problem # 1:  ACUTE BRONCHITIS (ICD-466.0) Assessment New  Her updated  medication list for this problem includes:    Zithromax Z-pak 250 Mg Tabs (Azithromycin) .Marland Kitchen... As directed    Promethazine-codeine 6.25-10 Mg/12ml Syrp (Promethazine-codeine) .Marland Kitchen... 1-2 tsp q 6 hrs as needed cough  Take antibiotics and other medications as directed. Encouraged to push clear liquids, get enough rest, and take acetaminophen as needed. To be seen in 5-7 days if no improvement, sooner if worse.  Problem # 2:  SINUSITIS, ACUTE (ICD-461.9) Assessment: New  Instructed on treatment. Call if symptoms persist or worsen.   Problem # 3:  HYPERTENSION (ICD-401.9) Assessment: Comment Only  Her updated medication list for this problem includes:    Diovan 160 Mg Tabs (Valsartan) ..... Once daily  Complete Medication List: 1)  Diovan 160 Mg Tabs (Valsartan) .... Once daily 2)  Nexium 40 Mg Pack (Esomeprazole magnesium) .Marland Kitchen.. 1 by mouth every morning 3)  Aspirin 81 Mg Tbec (Aspirin) .... Once daily 4)  Lipitor 10 Mg Tabs (Atorvastatin calcium) .... Once daily 5)  Folic Acid 1 Mg Tabs (Folic acid) .... One tab by mouth qhs 6)  Ibuprofen 800 Mg Tabs (Ibuprofen) .... Take 1 tablet by mouth three times a day 7)  Cyclobenzaprine Hcl 10 Mg Tabs (Cyclobenzaprine hcl) .... Take 1 tab by mouth at bedtime as needed 8)  Alprazolam 0.25 Mg Tabs (Alprazolam) .... Two times a day as needed 9)  Phentermine Hcl 37.5 Mg Caps (Phentermine hcl) .... Take 1 tablet by mouth once a day  (take 1/2 tab once daily ) 10)  Zithromax Z-pak 250 Mg Tabs (Azithromycin) .... As directed 11)  Promethazine-codeine 6.25-10 Mg/41ml Syrp (Promethazine-codeine) .Marland Kitchen.. 1-2 tsp q 6 hrs as needed cough  Patient Instructions: 1)  Please schedule a follow-up appointment as needed. 2)  You need to lose weight. Consider a lower calorie diet and regular exercise.  3)  It is not healthy  for men to drink more than 2-3 drinks per day or for women to drink more than 1-2 drinks per day. 4)  Take over the counter cough and cold medications  only as directed on the package insert. DO NOT take more than recommended . 5)  I have prescribed an antibiotica & cough medicine for you. Prescriptions: PROMETHAZINE-CODEINE 6.25-10 MG/5ML SYRP (PROMETHAZINE-CODEINE) 1-2 tsp q 6 hrs as needed cough  #6 oz x 0   Entered and Authorized by:   Esperanza Sheets PA   Signed by:   Adella Hare LPN on 62/95/2841   Method used:   Printed then faxed to ...       [address] [phone]* (retail)       [address]       [place], [place]  [address]       Ph: [phone] or [phone]       Fax: [phone]   RxID:   [id number] ZITHROMAX Z-PAK 250 MG TABS (AZITHROMYCIN) as directed  #1 pack x 0   Entered and Authorized by:   Esperanza Sheets PA   Signed by:   Adella Hare LPN on  07/23/2009   Method used:   Electronically to        [facility] [phone]* (retail)       [address]       [place], [place]  [address]       Ph: [phone] or [phone]       Fax: [phone]   RxID:   [id number]

## 2010-06-20 NOTE — Progress Notes (Signed)
Summary: sinus  Phone Note Call from Patient   Summary of Call: patient states she has had sinus issues since Monday and wanted to know if you could call her something in for it she asked for an appt. but I told her you weren't here this afternoon.  She states she also had the stomach virus and now the sinus.  Please call her at [phone]  Initial call taken by: Curtis Sites,  May 17, 2010 11:57 AM  Follow-up for Phone Call        stomaac virus advise bRAT and fluids , hand washing, may use otc immodium. for sinuses, iof otc claritin /zyrtec not working may addsudafed one daily for 3 to 5 days to reduce drainage, if fever , chills pressure and colored drainage ..urgent care Follow-up by: Syliva Overman MD,  May 17, 2010 12:17 PM  Additional Follow-up for Phone Call Additional follow up Details #1::        patient aware Additional Follow-up by: Adella Hare LPN,  May 17, 2010 1:42 PM

## 2010-06-20 NOTE — Letter (Signed)
Summary: Letter TO DOCTOR  Letter TO DOCTOR   Imported By: Lind Guest 05/03/2010 08:33:58  _____________________________________________________________________  External Attachment:    Type:   Image     Comment:   External Document

## 2010-06-20 NOTE — Letter (Signed)
Summary: Letter  Letter   Imported By: Lind Guest 05/06/2010 13:15:24  _____________________________________________________________________  External Attachment:    Type:   Image     Comment:   External Document

## 2010-06-20 NOTE — Letter (Signed)
Summary: Work Excuse  [facility]  [address]   [place], [place] [address]   Phone: [phone]  Fax: [phone]    Today's Date: Sep 27, 2009  Name of Patient: Caitlin Sampson  The above named patient had a medical visit today at:  8:45 am.  Please take this into consideration when reviewing the time away from work/school.    Special Instructions:  [  ] None  [  ] To be off the remainder of today, returning to the normal work / school schedule tomorrow.  [  ] To be off until the next scheduled appointment on ______________________.  [ x ] Other:   Off work until symptoms resolve.   Sincerely yours,   Esperanza Sheets PA

## 2010-06-20 NOTE — Miscellaneous (Signed)
Summary: med  Clinical Lists Changes  Medications: Changed medication from NEXIUM 40 MG PACK (ESOMEPRAZOLE MAGNESIUM) 1 by mouth every morning to NEXIUM 40 MG CPDR (ESOMEPRAZOLE MAGNESIUM) 1 tab q am - Signed Rx of NEXIUM 40 MG CPDR (ESOMEPRAZOLE MAGNESIUM) 1 tab q am;  #90 x 1;  Signed;  Entered by: Everitt Amber LPN;  Authorized by: Syliva Overman MD;  Method used: Historical    Prescriptions: NEXIUM 40 MG CPDR (ESOMEPRAZOLE MAGNESIUM) 1 tab q am  #90 x 1   Entered by:   Everitt Amber LPN   Authorized by:   Syliva Overman MD   Signed by:   Everitt Amber LPN on 16/02/9603   Method used:   Historical   RxID:   [id number]

## 2010-06-21 NOTE — Miscellaneous (Signed)
Summary: Immunizations / FLU  Immunizations / FLU   Imported By: Lind Guest 04/18/2010 08:52:22  _____________________________________________________________________  External Attachment:    Type:   Image     Comment:   External Document

## 2010-07-04 NOTE — Assessment & Plan Note (Signed)
Summary: OV   Vital Signs:  Patient profile:   50 year old female Height:      62 inches Weight:      231.13 pounds BMI:     42.43 O2 Sat:      96 % on Room air Pulse rate:   94 / minute Pulse rhythm:   regular Resp:     16 per minute BP sitting:   140 / 90  (left arm)  Vitals Entered By: Adella Hare LPN (June 13, 2010 2:55 PM)  Nutrition Counseling: Patient's BMI is greater than 25 and therefore counseled on weight management options.  O2 Flow:  Room air CC: head congestion, fatigues, sinus pressure Is Patient Diabetic? No   Primary Care Provider:  Syliva Overman MD  CC:  head congestion, fatigues, and sinus pressure.  History of Present Illness: Pt states that the first week of this yr she took levaquin for 5 days becuse resp illess in the chest and fever. Since yesterday bilat ear pressure, sore throat, dry scratchy throat periorbital pressure bad tasting drainagecough yellow green  Denies chest pain, palpitations, PND, orthopnea or leg swelling. Denies abdominal pain, nausea, vomitting, diarrhea or constipation. Denies change in bowel movements or bloody stool. Denies dysuria , frequency, incontinence or hesitancy. Denies  joint pain, swelling, or reduced mobility. . Denies depression, anxiety or insomnia. Denies  rash, lesions, or itch.     Current Medications (verified): 1)  Aspirin 81 Mg Tbec (Aspirin) .... Once Daily 2)  Lipitor 10 Mg Tabs (Atorvastatin Calcium) .... Once Daily 3)  Folic Acid 1 Mg Tabs (Folic Acid) .... One Tab By Mouth Qhs 4)  Alprazolam 0.25 Mg Tabs (Alprazolam) .... Two Times A Day As Needed 5)  Diovan Hct 320-25 Mg Tabs (Valsartan-Hydrochlorothiazide) .... Take One-Half Tab Once Daily 6)  Nexium 40 Mg Cpdr (Esomeprazole Magnesium) .... One Cap By Mouth Once Daily  Allergies (verified): No Known Drug Allergies  Review of Systems      See HPI General:  Complains of chills, fatigue, loss of appetite, and malaise; denies  fever. Eyes:  Denies discharge and red eye. ENT:  Complains of nasal congestion, postnasal drainage, and sinus pressure. CV:  Denies chest pain or discomfort, palpitations, and swelling of hands. Resp:  Complains of cough, shortness of breath, sputum productive, and wheezing. Neuro:  Complains of headaches; frontal headache with sinus pressure. Psych:  Complains of anxiety; denies depression; mild chronic amxiety. Endo:  Denies cold intolerance, excessive hunger, excessive thirst, excessive urination, and heat intolerance. Heme:  Denies abnormal bruising and bleeding. Allergy:  Denies hives or rash and itching eyes.  Physical Exam  General:  Pleasant female,in no acute distress; alert,appropriate and cooperative throughout examination. Ill appearing HEENT: No facial asymmetry,  EOMI,frontal and maxillary sinus tenderness, TM's erythematous, oropharynx  pink and moist.   Chest: decreased air entry bilateral crackles and few wheezes CVS: S1, S2, No murmurs, No S3.   Abd: Soft, Nontender.  MS: Adequate ROM spine, hips, shoulders and knees.  Ext: No edema.   CNS: CN 2-12 intact, power tone and sensation normal throughout.   Skin: Intact, no visible lesions or rashes.  Psych: Good eye contact, normal affect.  Memory intact, not anxious or depressed appearing.    Impression & Recommendations:  Problem # 1:  BRONCHITIS (ICD-490) Assessment Comment Only  The following medications were removed from the medication list:    Promethazine-codeine 6.25-10 Mg/71ml Syrp (Promethazine-codeine) .Marland Kitchen... 1-2 tsp q 6 hrs as needed cough Her  updated medication list for this problem includes:    Penicillin V Potassium 500 Mg Tabs (Penicillin v potassium) .Marland Kitchen... Take 1 tablet by mouth three times a day    Tessalon Perles 100 Mg Caps (Benzonatate) .Marland Kitchen... Take 1 capsule by mouth three times a day  Orders: Rocephin  250mg  (G2952) Admin of Therapeutic Inj  intramuscular or subcutaneous (84132)  Take  antibiotics and other medications as directed. Encouraged to push clear liquids, get enough rest, and take acetaminophen as needed. To be seen in 5-7 days if no improvement, sooner if worse.  Problem # 2:  SINUSITIS, ACUTE (ICD-461.9) Assessment: Comment Only  The following medications were removed from the medication list:    Promethazine-codeine 6.25-10 Mg/1ml Syrp (Promethazine-codeine) .Marland Kitchen... 1-2 tsp q 6 hrs as needed cough Her updated medication list for this problem includes:    Penicillin V Potassium 500 Mg Tabs (Penicillin v potassium) .Marland Kitchen... Take 1 tablet by mouth three times a day    Tessalon Perles 100 Mg Caps (Benzonatate) .Marland Kitchen... Take 1 capsule by mouth three times a day  Instructed on treatment. Call if symptoms persist or worsen.   Problem # 3:  MORBID OBESITY (ICD-278.01) Assessment: Comment Only  Ht: 62 (06/13/2010)   Wt: 231.13 (06/13/2010)   BMI: 42.43 (06/13/2010) therapeutic lifestyle change discussed and encouraged  Problem # 4:  HYPERTENSION (ICD-401.9) Assessment: Deteriorated  Her updated medication list for this problem includes:    Diovan Hct 320-25 Mg Tabs (Valsartan-hydrochlorothiazide) .Marland Kitchen... Take one-half tab once daily Patient advised to follow low sodium diet rich in fruit and vegetables, and to commit to at least 30 minutes 5 days per week of regular exercise , to improve blood presure control.   BP today: 140/90 Prior BP: 120/82 (05/02/2010)  Prior 10 Yr Risk Heart Disease: Not enough information (05/23/2008)  Labs Reviewed: K+: 3.6 (05/03/2010) Creat: : 0.58 (05/03/2010)   Chol: 197 (05/03/2010)   HDL: 63 (05/03/2010)   LDL: 99 (05/03/2010)   TG: 176 (05/03/2010)  Problem # 5:  HYPERLIPIDEMIA (ICD-272.4) Assessment: Comment Only  Her updated medication list for this problem includes:    Lipitor 10 Mg Tabs (Atorvastatin calcium) ..... Once daily Low fat dietdiscussed and encouraged  Labs Reviewed: SGOT: 16 (05/03/2010)   SGPT: 17  (05/03/2010)  Prior 10 Yr Risk Heart Disease: Not enough information (05/23/2008)   HDL:63 (05/03/2010)  LDL:99 (05/03/2010)  Chol:197 (05/03/2010)  Trig:176 (05/03/2010)  Complete Medication List: 1)  Aspirin 81 Mg Tbec (Aspirin) .... Once daily 2)  Lipitor 10 Mg Tabs (Atorvastatin calcium) .... Once daily 3)  Folic Acid 1 Mg Tabs (Folic acid) .... One tab by mouth qhs 4)  Alprazolam 0.25 Mg Tabs (Alprazolam) .... Two times a day as needed 5)  Diovan Hct 320-25 Mg Tabs (Valsartan-hydrochlorothiazide) .... Take one-half tab once daily 6)  Nexium 40 Mg Cpdr (Esomeprazole magnesium) .... One cap by mouth once daily 7)  Penicillin V Potassium 500 Mg Tabs (Penicillin v potassium) .... Take 1 tablet by mouth three times a day 8)  Tessalon Perles 100 Mg Caps (Benzonatate) .... Take 1 capsule by mouth three times a day  Other Orders: T- Hemoglobin A1C ([id number])  Patient Instructions: 1)  F/U as before 2)  It is important that you exercise regularly at least 30 minutes 5 times a week. If you develop chest pain, have severe difficulty breathing, or feel very tired , stop exercising immediately and seek medical attention. 3)  You need to lose weight. Consider a lower calorie  diet and regular exercise.  4)  You are being treated for bronchitisand sinusitis, you wiil get Rocephin 500mg  iM in the office and penicillin is also sent in 5)  We will provide 1 500 cal diet sheet and the [facility] carb diet 6)  HbgA1C prior to visit, ICD-9: Prescriptions: TESSALON PERLES 100 MG CAPS (BENZONATATE) Take 1 capsule by mouth three times a day  #30 x 0   Entered and Authorized by:   Syliva Overman MD   Signed by:   Syliva Overman MD on 06/13/2010   Method used:   Electronically to        [facility] [phone]* (retail)       [address]       [place], [place]  [address]       Ph: [phone] or [phone]       Fax: [phone]   RxID:   [id number] PENICILLIN V POTASSIUM 500  MG TABS (PENICILLIN V POTASSIUM) Take 1 tablet by mouth three times a day  #30 x 0   Entered and Authorized by:   Syliva Overman MD   Signed by:   Syliva Overman MD on 06/13/2010   Method used:   Electronically to        [facility] [phone]* (retail)       [address]       [place], [place]  [address]       Ph: [phone] or [phone]       Fax: [phone]   RxID:   [id number]    Medication Administration  Injection # 1:    Medication: Rocephin  250mg     Diagnosis: BRONCHITIS (ICD-490)    Route: IM    Site: RUOQ gluteus    Exp Date: 05/14    Lot #: [id number]    Mfr: [email or web address]    Comments: rocephin 500mg  given    Patient tolerated injection without complications    Given by: Adella Hare LPN (June 13, 2010 3:43 PM)  Orders Added: 1)  Est. Patient Level IV [71062] 2)  T- Hemoglobin A1C [83036-23375] 3)  Rocephin  250mg  [J0696] 4)  Admin of Therapeutic Inj  intramuscular or subcutaneous [96372]     Medication Administration  Injection # 1:    Medication: Rocephin  250mg     Diagnosis: BRONCHITIS (ICD-490)    Route: IM    Site: RUOQ gluteus    Exp Date: 05/14    Lot #: [id number]    Mfr: [email or web address]    Comments: rocephin 500mg  given    Patient tolerated injection without complications    Given by: Adella Hare LPN (June 13, 2010 3:43 PM)  Orders Added: 1)  Est. Patient Level IV [62703] 2)  T- Hemoglobin A1C [83036-23375] 3)  Rocephin  250mg  [J0696] 4)  Admin of Therapeutic Inj  intramuscular or subcutaneous [50093]

## 2010-08-21 ENCOUNTER — Ambulatory Visit: Payer: Self-pay

## 2010-08-22 ENCOUNTER — Ambulatory Visit (INDEPENDENT_AMBULATORY_CARE_PROVIDER_SITE_OTHER): Payer: BC Managed Care – PPO | Admitting: Family Medicine

## 2010-08-22 VITALS — BP 120/88 | Wt 222.1 lb

## 2010-08-22 DIAGNOSIS — L255 Unspecified contact dermatitis due to plants, except food: Secondary | ICD-10-CM

## 2010-08-22 DIAGNOSIS — L237 Allergic contact dermatitis due to plants, except food: Secondary | ICD-10-CM

## 2010-08-22 MED ORDER — METHYLPREDNISOLONE ACETATE 80 MG/ML IJ SUSP
80.0000 mg | Freq: Once | INTRAMUSCULAR | Status: AC
  Administered 2010-08-22: 80 mg via INTRAMUSCULAR

## 2010-08-22 MED ORDER — PREDNISONE (PAK) 5 MG PO TABS: ORAL_TABLET | ORAL | Status: DC

## 2010-08-22 NOTE — Progress Notes (Signed)
Depo medrol 80 mg given per Dr. Lodema Hong and prednisone dose pack sent

## 2010-08-28 ENCOUNTER — Telehealth: Payer: Self-pay | Admitting: Family Medicine

## 2010-08-28 ENCOUNTER — Telehealth: Payer: Self-pay

## 2010-08-28 DIAGNOSIS — L309 Dermatitis, unspecified: Secondary | ICD-10-CM

## 2010-08-28 NOTE — Telephone Encounter (Signed)
pls sched appt with dermatology today if at all possible , and call her with appt, I am entering in referral box also

## 2010-08-30 NOTE — Telephone Encounter (Signed)
Appointment made with Dr. Margo Aye

## 2010-09-27 ENCOUNTER — Ambulatory Visit (INDEPENDENT_AMBULATORY_CARE_PROVIDER_SITE_OTHER): Payer: BC Managed Care – PPO | Admitting: Urology

## 2010-09-27 ENCOUNTER — Other Ambulatory Visit: Payer: Self-pay | Admitting: Urology

## 2010-09-27 ENCOUNTER — Ambulatory Visit (HOSPITAL_COMMUNITY)
Admission: RE | Admit: 2010-09-27 | Discharge: 2010-09-27 | Disposition: A | Discharge status: Home / Self Care | Payer: BC Managed Care – PPO | Source: Ambulatory Visit | Attending: Urology | Admitting: Urology

## 2010-09-27 DIAGNOSIS — K7689 Other specified diseases of liver: Secondary | ICD-10-CM | POA: Insufficient documentation

## 2010-09-27 DIAGNOSIS — R1032 Left lower quadrant pain: Secondary | ICD-10-CM | POA: Insufficient documentation

## 2010-09-27 DIAGNOSIS — Z87442 Personal history of urinary calculi: Secondary | ICD-10-CM

## 2010-09-27 DIAGNOSIS — K5732 Diverticulitis of large intestine without perforation or abscess without bleeding: Secondary | ICD-10-CM | POA: Insufficient documentation

## 2010-09-30 ENCOUNTER — Ambulatory Visit (HOSPITAL_COMMUNITY): Payer: BC Managed Care – PPO

## 2010-09-30 ENCOUNTER — Other Ambulatory Visit (HOSPITAL_COMMUNITY): Payer: BC Managed Care – PPO

## 2010-10-04 NOTE — Op Note (Signed)
[facility]  Patient:    Caitlin Sampson, Caitlin Sampson Visit Number: [id number] MRN: [id number]          Service Type: DSU Location: [id number] Attending Physician:  Soledad Gerlach Dictated by:   Guy Sandifer Arleta Creek, M.D. Proc. Date: 11/08/01 Admit Date:  11/08/2001                             Operative Report  PREOPERATIVE DIAGNOSIS:       1. Menometrorrhagia.                               2. Endometriosis.  POSTOPERATIVE DIAGNOSIS:      Menometrorrhagia.  OPERATION:                    Laparoscopically assisted vaginal hysterectomy.  SURGEON:                      Guy Sandifer. Arleta Creek, M.D.  ASSISTANT:                    Trevor Iha, M.D.  ANESTHESIA:                   General endotracheal intubation.  ESTIMATED BLOOD LOSS:         150 cc.  INDICATIONS AND CONSENT:      This patient is a 50 year old married white female, G1, P1, status post tubal ligation with a history of endometriosis and increasingly heavy and irregular bleeding. Details have been dictated in the history and physical. Laparoscopic-assisted vaginal hysterectomy with removal of no more than one ovary, only if abnormal, is discussed. The potential risks and complications of the procedure have been discussed including, but not limited too, infection, bowel, bladder, ureteral damage, bleeding requiring transfusion of blood products with the possible transfusion reaction, HIV, and hepatitis acquisition, DVT, PE, pneumonia, fistula formation, laparotomy postoperative dyspareunia, and recurrent pain. All questions have been answered and consent is signed on the chart.  FINDINGS:                     Upper abdomen is grossly normal. In the pelvis, the uterus is minimally enlarged to approximately six weeks size. The left ovary contains a less than 1 cm smooth translucent-type cyst. The right ovary contains a 2-3 cm smooth translucent cyst which easily ruptures  upon manipulation of the ovary and drains a straw colored serous fluid. The anterior and posterior cul-de-sac and the pelvic side walls are normal. The tubes are status post ligation bilaterally.  PROCEDURE:                    The patient is taken to the operating room, placed in the dorsal supine position where general anesthesia is induced via endotracheal intubation. She was then placed in the dorsal lithotomy position where she was prepped with Hibiclens solution. Bladder was straight catheterized. A Hulka tenaculum was placed in the uterus as a manipulator and she was draped in a sterile fashion. A small infraumbilical incision was made and a 1011 disposable trocar sleeve was placed on the first attempt without difficulty. Placement was verified with a laparoscope and no damage to the surrounding structures is noted. A small suprapubic and later a left lower quadrant incisions were made after careful transillumination  and 5 mm nondisposable trocar sleeves were placed under direct visualization without difficulty. The above findings are noted. A small amount of bipolar cautery was used on the right ovary to obtain hemostasis at the point of rupture of the right ovarian cyst. The proximal ligaments were then taken down with the Gyros cautery cutting instrument. This was done first on the right side and then on the left, down to the point of the vesicouterine peritoneum bilaterally. The proximal portion to the fallopian tube was taken down with the specimen. Good hemostasis was maintained. A small midline incision was made in the vesicouterine peritoneum which was then hydrodissected and the vesicouterine peritoneum was then taken down cephalolaterally sharply. At this point, instruments were removed and attention was turned to the vagina. The posterior cul-de-sac was entered sharply without difficulty. The cervix was circumscribed with a scalpel and the mucosa was advanced bluntly and  sharply. Then again, using the Gyros cautery device, the uterosacral ligaments are taken bilaterally. This is followed by the bladder pillars and the cardinal ligaments and then the uterine vessels bilaterally. At least one bite above the uterine vessels was taken bilaterally. The specimen was then delivered posteriorly. The uterosacral ligaments were then plicated to the vaginal cuff bilaterally with 0 Monocryl suture. A suture was also placed at the 3 and 9 oclock positions to aid in hemostasis of the vaginal cuff. The cuff was then reapproximated with figure-of-eight sutures. This achieved good hemostasis. The Foley catheter was placed in the bladder and clear urine was noted. Attention was then returned to the abdomen. Copious irrigation was carried out. Minor bipolar cautery at some peritoneal edges was undertaken to obtain complete hemostasis. Suprapubic and left lower quadrant trocar sleeves were then removed. Pneumoperitoneum was reduced and no bleeding was noted from any site. The pneumoperitoneum was then completely removed. The skin incisions were injected with 0.5% plain Marcaine for a total of 10 cc. DURABOND was then used to close the skin incisions. All counts were correct. The patient was then awakened and taken to the recovery room in stable condition. Dictated by:   Guy Sandifer Arleta Creek, M.D. Attending Physician:  Soledad Gerlach DD:  11/08/01 TD:  11/08/01 Job: [id number]

## 2010-10-04 NOTE — Op Note (Signed)
NAME:  Caitlin Sampson, Caitlin Sampson                           ACCOUNT NO.:  [id number]   MEDICAL RECORD NO.:  [id number]                   PATIENT TYPE:  EMS   LOCATION:  ED                                   FACILITY:  [facility]   PHYSICIAN:  R. Roetta Sessions, M.D.              DATE OF BIRTH:  [birth year]   DATE OF PROCEDURE:  05/10/2002  DATE OF DISCHARGE:                                 OPERATIVE REPORT   PROCEDURE:  Emergency esophagogastroduodenoscopy.   ENDOSCOPIST:  Gerrit Friends. Rourk, M.D.   INDICATIONS FOR PROCEDURE:  This is a 50 year old, obese female with a long  history of gastroesophageal reflux disease who has been off acid suppression  therapy more or less the past year.  She has had worsening episodes of  reflux regurgitation over the past 1 week.  She was seen by Dr. Juanetta Gosling and  started on Protonix.  She has not had the same response that she did  previously with Aciphex.   Yesterday and today developed acutely worsening symptoms and developed some  rawness in her throat,  some difficulty with swallowing, but really has  not had had any dysphagia.  She has been able to swallow liquids. She made  contact with Dr. Juanetta Gosling who called our office and we suggested that she  come to the emergency room.  She was indeed seen by Dr. Rosalia Hammers in the emergency  room.  A chest x-ray and plain films of the neck were reportedly negative.  I was asked to see her and felt that EGD was indicated.  This lady tells me  that she had an EGD for similar symptoms by Dr. Jarold Motto down in  [place], some 4 years ago.  She was told that she had a hiatal hernia.  At that time she was placed on Aciphex which controlled her symptoms very  well until 1 year ago when she stopped taking the medication.   We saw this lady back in the mid and early 1990's for gas/bloating felt to  be functional in origin.  She underwent EGD by Dr. _______ in 1994 for an  episode of recurrent chest pain in the setting of stress and  apparent panic  attacks.  EGD was performed and revealed a normal upper GI tract.  She was  started on Prilosec at that time.   She has not been seen by our group since 1996.  EGD is now being done for  the reasons outlined above.  This approach has been discussed with the  patient at length.  The potential risks, benefits, and alternatives have  been reviewed; and questions answered.  She is agreeable.  ASA 1.   DESCRIPTION OF PROCEDURE:  O2 saturation, blood pressure, pulse and  respirations were monitored throughout the entire procedure.   CONSCIOUS SEDATION:  Versed 5 mg IV, Demerol 125 mg IV in divided doses,  Cetacaine spray for topical oropharyngeal  anesthesia.   INSTRUMENT:  Olympus video chip diagnostic gastroscope.   FINDINGS:  Examination of the hypopharynx revealed no abnormalities.  The  esophagus was easily intubated.  Examination of the tubular esophagus  revealed normal mucosa.  The EG junction was easily traversed.   STOMACH:  The gastric cavity was empty.  It insufflated well with air.  A  thorough examination of the gastric mucosa including a retroflex view of the  proximal stomach and esophagogastric junction demonstrated only a small  (approximately 2 cm) hiatal hernia.  The remainder of the gastric mucosa  appeared normal.  The pylorus was patent and easily traversed.   DUODENUM:  The bulb and the second portion appeared normal.   THERAPY/DIAGNOSTIC MANEUVERS:  None.   The patient tolerated the procedure well and was reacted in endoscopy.   IMPRESSION:  1. Normal appearing esophagus.  2. Small hiatal hernia otherwise normal stomach and duodenum through the     second portion.   DISCUSSION:  I suspect that the patient has been experiencing unbridled  gastroesophageal reflux disease recently.   RECOMMENDATIONS:  1. A multipronged treatment approach for gastroesophageal reflux.  I have     encouraged her to get back on Aciphex 20 mg orally b.i.d. for the  next 2     month and drop back to once daily thereafter.  I have asked her to try to     plan on loosing 25 pounds in the next 1 year.  2. Literature on antireflux measures given to the patient.  3. Appointment to see [place] in the office in 3 months.                                               Jonathon Bellows, M.D.    RMR/MEDQ  D:  05/10/2002  T:  05/10/2002  Job:  [id number]   cc:   Ramon Dredge L. Juanetta Gosling, M.D.  [address]  [place]  [place] [address]  Fax: [phone]   Hilario Quarry, M.D.  [address]  [place]  [place] [address]  Fax: [phone]

## 2010-10-04 NOTE — H&P (Signed)
[facility]  Patient:    Caitlin Sampson, Caitlin Sampson Visit Number: [id number] MRN: [id number]          Service Type: Attending:  Guy Sandifer. Arleta Creek, M.D. Dictated by:   Guy Sandifer Arleta Creek, M.D. Adm. Date:  11/08/01                           History and Physical  CHIEF COMPLAINT:              Menometrorrhagia and endometriosis with pelvic pain.  HISTORY OF PRESENT ILLNESS:   This patient is a 50 year old, married, white female, G1, P1, status post tubal ligation with known endometriosis.  She has a long history of right lower quadrant pain which has been increasing.  She also has significant premenstrual pain and dysmenorrhea.  Menses are irregular and very heavy.  An ultrasound in my office on Sep 16, 2001, revealed a uterus measuring 7.7 x 5.1 x 4.5 cm with at least one uterine leiomyomata measuring 1.2 cm noted.  The right ovary could not be visualized and the left ovary contained a 1.7 cm complex cyst possibly consistent with an endometrioma. After discussion of the options, the patient is being admitted for laparoscopically-assisted vaginal hysterectomy with possible removal of one ovary if abnormal.  PAST MEDICAL HISTORY:         1. Chronic hypertension.                               2. Endometriosis.                               3. Kidney stones.                               4. Frequent urinary tract infections.                               5. Depression.  PAST SURGICAL HISTORY:        1. Hysteroscopy, D&C, and laparoscopy with                                  bilateral tubal cautery in 1997.                               2. Urethra stretch in 1986.  OBSTETRICAL HISTORY:          Vaginal delivery x 1.  MEDICATIONS:                  1. Covera HS 180 mg daily.                               2. Trimethoprim 100 mg daily.  ALLERGIES:                    X-RAY DYES leading to itching.  FAMILY HISTORY:               Diabetes in maternal  grandmother.  SOCIAL HISTORY:  The patient denies tobacco, alcohol, or drug abuse.  REVIEW OF SYSTEMS:            The patient complains of increased forgetfulness and trouble with recall recently.  Recent evaluation with Santina Evans A. Orlin Hilding, M.D., of neurology was felt to be apparently consistent with stress and no treatment was offered at this time preoperatively.  PHYSICAL EXAMINATION:         Height 5 feet 2 inches.  Weight 220-1/2 pounds.  VITAL SIGNS:                  Blood pressure 108/72.  HEENT:                        Without thyromegaly.  LUNGS:                        Clear to auscultation.  HEART:                        Regular rate and rhythm.  BACK:                         Without CVA tenderness.  BREASTS:                      Without mass, retraction, or discharge.  ABDOMEN:                      Soft and nontender without masses.  PELVIC:                       Vulva, vagina, and cervix without lesions. Uterus normal size and nontender.  Adnexa nontender without masses.  EXTREMITIES:                  Grossly within normal limits.  NEUROLOGIC:                   Grossly within normal limits.  ASSESSMENT:                   Endometriosis with pelvic pain and menometrorrhagia.  PLAN:                         Laparoscopically-assisted vaginal hysterectomy with possible unilateral salpingo-oophorectomy.Dictated by:   Guy Sandifer Arleta Creek, M.D. Attending:  Guy Sandifer Arleta Creek, M.D. DD:  10/28/01 TD:  10/28/01 Job: [id number]

## 2010-10-04 NOTE — Discharge Summary (Signed)
[facility]  Patient:    Caitlin Sampson, Caitlin Sampson Visit Number: [id number] MRN: [id number]          Service Type: DSU Location: [id number] Attending Physician:  Soledad Gerlach Dictated by:   Guy Sandifer Arleta Creek, M.D. Admit Date:  11/08/2001 Discharge Date: 11/09/2001                             Discharge Summary  ADMISSION DIAGNOSES:          1. Menometrorrhagia.                               2. Endometriosis.  DISCHARGE DIAGNOSIS:          Menometrorrhagia.  PROCEDURE:                    On November 08, 2001, laparoscopically-assisted vaginal hysterectomy.  REASON FOR ADMISSION:         This patient is a 50 year old married white female, gravida 1, para 1, status post tubal ligation with increasing pain and menometrorrhagia.  Details are dictated in history and physical.  HOSPITAL COURSE:              The patient is admitted to the hospital and undergoes the above procedure.  Estimated blood loss is 150 cc.  On the evening of surgery, she has good pain relief and is ambulatory.  She is afebrile and urine output is clear.  Lungs are clear.  Abdomen is soft.  Bowel sounds are positive.  On the day of discharge, she is passing flatus and tolerating a regular diet.  She remains afebrile.  Hemoglobin is 10.4, white count 9.9.  Pathology is pending.  CONDITION ON DISCHARGE:       Good.  DIET:                         Regular as tolerated.  ACTIVITY:                     No lifting, no operation of automobiles, no vaginal entry.  FOLLOW-UP:                    She is to call the office for problems including, but not limited to, temperature of 101 or greater, heavy vaginal bleeding, increasing pain, or persistent nausea and vomiting.  DISCHARGE MEDICATIONS:        1. Percocet 5/325 mg #30 one to two p.o. q.6h.                                  p.r.n.                               2. Ibuprofen 600 mg p.o. q.6h. p.r.n.  3. Multivitamin daily.  FOLLOW-UP:                    In the office in two weeks. Dictated by:   Guy Sandifer Arleta Creek, M.D. Attending Physician:  Soledad Gerlach DD:  11/09/01 TD:  11/09/01 Job: [id number]

## 2010-10-16 ENCOUNTER — Telehealth: Payer: Self-pay | Admitting: Family Medicine

## 2010-10-16 DIAGNOSIS — W57XXXA Bitten or stung by nonvenomous insect and other nonvenomous arthropods, initial encounter: Secondary | ICD-10-CM

## 2010-10-16 NOTE — Telephone Encounter (Signed)
Please advise 

## 2010-10-18 NOTE — Telephone Encounter (Signed)
Add IGG and IGM antibody titer for lyme disease B.burgdorferi

## 2010-10-18 NOTE — Telephone Encounter (Signed)
Also do RMSF titer ( rocky mountain spotted fever titer ) pls

## 2010-10-18 NOTE — Telephone Encounter (Signed)
Order sent and left msg with patient

## 2010-10-21 ENCOUNTER — Other Ambulatory Visit: Payer: Self-pay | Admitting: Family Medicine

## 2010-10-22 LAB — B. BURGDORFI ANTIBODIES: B burgdorferi Ab IgG+IgM: 0.27 {ISR}

## 2010-10-23 ENCOUNTER — Encounter: Payer: Self-pay | Admitting: Family Medicine

## 2010-10-24 ENCOUNTER — Ambulatory Visit (INDEPENDENT_AMBULATORY_CARE_PROVIDER_SITE_OTHER): Payer: BC Managed Care – PPO | Admitting: Family Medicine

## 2010-10-24 ENCOUNTER — Encounter: Payer: Self-pay | Admitting: Family Medicine

## 2010-10-24 VITALS — BP 130/90 | HR 71 | Resp 16 | Ht 62.0 in | Wt 219.4 lb

## 2010-10-24 DIAGNOSIS — R7303 Prediabetes: Secondary | ICD-10-CM

## 2010-10-24 DIAGNOSIS — E785 Hyperlipidemia, unspecified: Secondary | ICD-10-CM

## 2010-10-24 DIAGNOSIS — I10 Essential (primary) hypertension: Secondary | ICD-10-CM

## 2010-10-24 DIAGNOSIS — F411 Generalized anxiety disorder: Secondary | ICD-10-CM

## 2010-10-24 DIAGNOSIS — R7309 Other abnormal glucose: Secondary | ICD-10-CM

## 2010-10-24 DIAGNOSIS — Z139 Encounter for screening, unspecified: Secondary | ICD-10-CM

## 2010-10-24 MED ORDER — ESOMEPRAZOLE MAGNESIUM 40 MG PO CPDR: 40.0000 mg | DELAYED_RELEASE_CAPSULE | Freq: Every day | ORAL | Status: DC

## 2010-10-24 MED ORDER — ATORVASTATIN CALCIUM 10 MG PO TABS: 10.0000 mg | ORAL_TABLET | Freq: Every day | ORAL | Status: DC

## 2010-10-24 NOTE — Patient Instructions (Signed)
F/u in  4 months.   Congrats on weight  Loss.  It is important that you exercise regularly at least 30 minutes 7 times a week. If you develop chest pain, have severe difficulty breathing, or feel very tired, stop exercising immediately and seek medical attention  Fasting chem 7 lipid, hepatic , hBA1c and vit D  Goal is to continue the changes fort better  Health  tdap today if none in 10 years, we will verify with your previous record

## 2010-11-06 ENCOUNTER — Encounter: Payer: Self-pay | Admitting: Family Medicine

## 2010-11-06 DIAGNOSIS — R7303 Prediabetes: Secondary | ICD-10-CM | POA: Insufficient documentation

## 2010-11-06 NOTE — Assessment & Plan Note (Signed)
Improved. Pt applauded on succesful weight loss through lifestyle change, and encouraged to continue same. Weight loss goal set for the next several months.  

## 2010-11-06 NOTE — Assessment & Plan Note (Signed)
Low fat diet discused and encouraged, rept labs due

## 2010-11-06 NOTE — Progress Notes (Signed)
  Subjective:    Patient ID: Caitlin Sampson, female    DOB: [birth year], 50 y.o.   MRN: [id number]  HPI The PT is here for follow up and re-evaluation of chronic medical conditions, medication management and review of recent lab and radiology data.  Preventive health is updated, specifically  Cancer screening, Osteoporosis screening and Immunization.   Questions or concerns regarding consultations or procedures which the PT has had in the interim are  addressed. The PT denies any adverse reactions to current medications since the last visit.  Since her last visit pt had sever poison oak which necessitated eval by dermatology. She also reports acute abd pain episode which was due to acute diverticulitis, she had thought it was renal colic, was seen by urology ho set up an abd scan emergently which clinched the dx She has worked succesfully on ;lifestyle change, and is encouraged by her weight loss   Review of Systems Denies recent fever or chills. Denies sinus pressure, nasal congestion, ear pain or sore throat. Denies chest congestion, productive cough or wheezing. Denies chest pains, palpitations, paroxysmal nocturnal dyspnea, orthopnea and leg swelling Denies abdominal pain, nausea, vomiting,diarrhea or constipation.  Denies rectal bleeding or change in bowel movement. Denies dysuria, frequency, hesitancy or incontinence. Denies joint pain, swelling and limitation in mobility. Denies headaches, seizure, numbness, or tingling. Denies depression, anxiety or insomnia. Denies skin break down or rash.        Objective:   Physical Exam    Patient alert and oriented and in no Cardiopulmonary distress.  HEENT: No facial asymmetry, EOMI, no sinus tenderness, TM's clear, Oropharynx pink and moist.  Neck supple no adenopathy.  Chest: Clear to auscultation bilaterally.  CVS: S1, S2 no murmurs, no S3.  ABD: Soft non tender. Bowel sounds normal.  Ext: No edema  MS: Adequate ROM spine,  shoulders, hips and knees.  Skin: Intact, no ulcerations or rash noted.  Psych: Good eye contact, normal affect. Memory intact not anxious or depressed appearing.  CNS: CN 2-12 intact, power, tone and sensation normal throughout.     Assessment & Plan:

## 2010-11-06 NOTE — Assessment & Plan Note (Signed)
Pt educated re impt of continued weight loss and lifestyle change to prevent dev of diabetes

## 2010-11-06 NOTE — Assessment & Plan Note (Signed)
Controlled, no change in medication  

## 2010-12-11 ENCOUNTER — Encounter: Payer: Self-pay | Admitting: Family Medicine

## 2010-12-11 ENCOUNTER — Ambulatory Visit (INDEPENDENT_AMBULATORY_CARE_PROVIDER_SITE_OTHER): Payer: BC Managed Care – PPO | Admitting: Family Medicine

## 2010-12-11 VITALS — BP 120/80 | HR 94 | Resp 16 | Ht 62.0 in | Wt 219.8 lb

## 2010-12-11 DIAGNOSIS — R7309 Other abnormal glucose: Secondary | ICD-10-CM

## 2010-12-11 DIAGNOSIS — K219 Gastro-esophageal reflux disease without esophagitis: Secondary | ICD-10-CM

## 2010-12-11 DIAGNOSIS — R7303 Prediabetes: Secondary | ICD-10-CM

## 2010-12-11 DIAGNOSIS — N3000 Acute cystitis without hematuria: Secondary | ICD-10-CM | POA: Insufficient documentation

## 2010-12-11 DIAGNOSIS — E785 Hyperlipidemia, unspecified: Secondary | ICD-10-CM

## 2010-12-11 DIAGNOSIS — I10 Essential (primary) hypertension: Secondary | ICD-10-CM

## 2010-12-11 DIAGNOSIS — N390 Urinary tract infection, site not specified: Secondary | ICD-10-CM

## 2010-12-11 DIAGNOSIS — E669 Obesity, unspecified: Secondary | ICD-10-CM

## 2010-12-11 LAB — POCT URINALYSIS DIPSTICK
Nitrite, UA: POSITIVE
pH, UA: 5.5

## 2010-12-11 MED ORDER — CIPROFLOXACIN HCL 500 MG PO TABS: 500.0000 mg | ORAL_TABLET | Freq: Two times a day (BID) | ORAL | Status: AC

## 2010-12-11 MED ORDER — FLUCONAZOLE 150 MG PO TABS: 150.0000 mg | ORAL_TABLET | Freq: Once | ORAL | Status: AC

## 2010-12-11 MED ORDER — CEFTRIAXONE SODIUM 500 MG IJ SOLR
500.0000 mg | Freq: Once | INTRAMUSCULAR | Status: AC
  Administered 2010-12-11: 500 mg via INTRAMUSCULAR

## 2010-12-11 NOTE — Patient Instructions (Addendum)
F/u in  October as before. You are being treated for a urinary tract infection, and will get rocephin in the office and cipro is sent to the pharmacy  You will be referred for individual counselling which is free for obesity

## 2010-12-11 NOTE — Progress Notes (Signed)
  Subjective:    Patient ID: Caitlin Sampson, female    DOB: 22-May-1960, 49 y.o.   MRN: [id number]  HPI 2 day h/o urinary frequebncy with pain, no flank pain fever or chills. Chronic medical conditions are also reviewed at this visit , also lab data   Review of Systems Denies recent fever or chills. Denies sinus pressure, nasal congestion, ear pain or sore throat. Denies chest congestion, productive cough or wheezing. Denies chest pains, palpitations and leg swelling Denies abdominal pain, nausea, vomiting,diarrhea or constipation.   . Denies joint pain, swelling and limitation in mobility. Denies headaches, seizures, numbness, or tingling. Denies depression, anxiety or insomnia. Denies skin break down or rash.        Objective:   Physical Exam Patient alert and oriented and in no cardiopulmonary distress.Uncomfortable, in pain  HEENT: No facial asymmetry, EOMI, no sinus tenderness,  oropharynx pink and moist.  Neck supple no adenopathy.  Chest: Clear to auscultation bilaterally.  CVS: S1, S2 no murmurs, no S3.  ABD: Soft, mild suprapubic tenderness.No renal angle tenderness. Bowel sounds normal.  Ext: No edema  MS: Adequate ROM spine, shoulders, hips and knees.  Skin: Intact, no ulcerations or rash noted.  Psych: Good eye contact, normal affect. Memory intact not anxious or depressed appearing.  CNS: CN 2-12 intact, power, tone and sensation normal throughout.        Assessment & Plan:

## 2010-12-15 LAB — URINE CULTURE

## 2010-12-19 NOTE — Assessment & Plan Note (Signed)
Controlled, no change in medication  

## 2010-12-19 NOTE — Assessment & Plan Note (Signed)
rept HBA1c in October, the importance of lifestyle change and weight loss to prevent or delay the onset of diabetes is discussed

## 2010-12-19 NOTE — Assessment & Plan Note (Signed)
unchanged Patient re-educated about  the importance of commitment to a  minimum of 150 minutes of exercise per week. The importance of healthy food choices with portion control discussed. Encouraged to start a food diary, count calories and to consider  joining a support group. Sample diet sheets offered. Goals set by the patient for the next several months.    

## 2010-12-19 NOTE — Assessment & Plan Note (Signed)
Symptomatic , and abn ccua, will treat presumptively and f/u on culture

## 2011-02-03 ENCOUNTER — Ambulatory Visit (INDEPENDENT_AMBULATORY_CARE_PROVIDER_SITE_OTHER): Payer: BC Managed Care – PPO | Admitting: Family Medicine

## 2011-02-03 ENCOUNTER — Encounter: Payer: Self-pay | Admitting: Family Medicine

## 2011-02-03 VITALS — BP 120/80 | HR 83 | Temp 98.4°F | Resp 16 | Ht 62.0 in | Wt 223.8 lb

## 2011-02-03 DIAGNOSIS — I10 Essential (primary) hypertension: Secondary | ICD-10-CM

## 2011-02-03 DIAGNOSIS — J019 Acute sinusitis, unspecified: Secondary | ICD-10-CM

## 2011-02-03 DIAGNOSIS — J0100 Acute maxillary sinusitis, unspecified: Secondary | ICD-10-CM

## 2011-02-03 DIAGNOSIS — R51 Headache: Secondary | ICD-10-CM

## 2011-02-03 MED ORDER — CEFTRIAXONE SODIUM 500 MG IJ SOLR
500.0000 mg | Freq: Once | INTRAMUSCULAR | Status: AC
  Administered 2011-02-03: 500 mg via INTRAMUSCULAR

## 2011-02-03 MED ORDER — KETOROLAC TROMETHAMINE 30 MG/ML IJ SOLN
60.0000 mg | Freq: Once | INTRAMUSCULAR | Status: AC
  Administered 2011-02-03: 60 mg via INTRAMUSCULAR

## 2011-02-03 MED ORDER — PENICILLIN V POTASSIUM 500 MG PO TABS: 500.0000 mg | ORAL_TABLET | Freq: Three times a day (TID) | ORAL | Status: AC

## 2011-02-03 NOTE — Progress Notes (Signed)
  Subjective:    Patient ID: Caitlin Sampson, female    DOB: September 16, 1960, 50 y.o.   MRN: [id number]  HPI 2 day h/o right maxillary pressure, drainage and sore neck glands, also pain and pressure in right ear.    Review of Systems See HPI Denies recent fever but has had chills.  Denies chest congestion, productive cough or wheezing. Denies chest pains, palpitations and leg swelling Denies abdominal pain, nausea, vomiting,diarrhea or constipation.   Reports  headaches,dnies seizures, numbness, or tingling. Denies depression, anxiety or insomnia. Denies skin break down or rash.        Objective:   Physical Exam Patient alert and oriented and in no cardiopulmonary distress.  HEENT: No facial asymmetry, EOMI,right maxillary sinus tenderness,  oropharynx pink and moist.  Neck supple no adenopathy.right TM dull.  Chest: Clear to auscultation bilaterally.  CVS: S1, S2 no murmurs, no S3.  ABD: Soft non tender. Bowel sounds normal.  Ext: No edema  MS: Adequate ROM spine, shoulders, hips and knees.       Assessment & Plan:

## 2011-02-03 NOTE — Patient Instructions (Addendum)
F/u as before.  You have acute maxillary sinusitis, right.  It is important you take entire antibiotic course prescribed. Work excuse from 09/17 to 02/05/2011  Rocephin will be administered in the office for infection, and toradol for your headache  OK to use tylenol and or ibuprofen for pain and chills

## 2011-02-08 NOTE — Assessment & Plan Note (Signed)
Controlled, no change in medication  

## 2011-02-08 NOTE — Assessment & Plan Note (Addendum)
Antibiotic prescribed and administered 

## 2011-02-20 ENCOUNTER — Encounter: Payer: Self-pay | Admitting: Family Medicine

## 2011-02-20 ENCOUNTER — Other Ambulatory Visit: Payer: Self-pay

## 2011-02-20 MED ORDER — VALSARTAN-HYDROCHLOROTHIAZIDE 320-25 MG PO TABS: 0.5000 | ORAL_TABLET | Freq: Every day | ORAL | Status: DC

## 2011-03-27 ENCOUNTER — Encounter (HOSPITAL_COMMUNITY): Payer: Self-pay | Admitting: Dietician

## 2011-03-27 NOTE — Progress Notes (Signed)
Follow-up Note for Nutrition Counseling for Obesity Ht: 62' Wt: 223# BMI: 40.75 Wt change: +5# (2.2%) x 2 months Pt presents for follow up appointment. Pt expressed frustration due to weight gain. Admits that "I haven't done anything, but there are no excuses". Pt states she knows what to do, but she doesn't know what it will take to get her to do it. Due to her busy lifestyle, she admits to engaging in a lot of stress eating. She also struggles with meal planning, as she usually eats dinner herself and doesn't want to prepare large meals when she gets home for work. Discussed using a crock pot and freezing portions for another time. Obesity continues. Pt has good intentions to lose weight, as she knows what could happen if weight gain persists. She is concerned about her upcoming bloodwork and doctor's appointment, as she does not want to be diagnosed with diabetes. Pt reports that she knows that there are no "quick fix" diet methods, but questioned about [employer] and [employer] Fat Loss system. Counseled on long-term lifestyle changes and reviewed plate method and high fiber foods. Pt has not incorporated physical activity yet, but wants to start exercising with a group of friends at work or her best friend. She is thinking about joining a gym to take water aerobics or taking Zumba classes. Goals: 1) Maintain wt during holiday season, but try to achieve 1-2# weight loss per week after New Year's; 2) 2.5 hours of physical activity weekly (call gym for information about water aerobics or try Zumba class); 3) Keep food diary; 4) Prepare meals ahead of time. Expect fair compliance. F/U in 2 months.  Melody Haver, RD, LDN Date: 03/27/11 Time: 1300

## 2011-04-10 LAB — BASIC METABOLIC PANEL
BUN: 9 mg/dL (ref 6–23)
CO2: 29 mEq/L (ref 19–32)
Chloride: 101 mEq/L (ref 96–112)
Creat: 0.6 mg/dL (ref 0.50–1.10)
Glucose, Bld: 99 mg/dL (ref 70–99)

## 2011-04-10 LAB — HEPATIC FUNCTION PANEL
Albumin: 4.3 g/dL (ref 3.5–5.2)
Alkaline Phosphatase: 74 U/L (ref 39–117)
Indirect Bilirubin: 0.4 mg/dL (ref 0.0–0.9)
Total Bilirubin: 0.5 mg/dL (ref 0.3–1.2)

## 2011-04-10 LAB — LIPID PANEL: LDL Cholesterol: 104 mg/dL — ABNORMAL HIGH (ref 0–99)

## 2011-04-10 LAB — VITAMIN D 25 HYDROXY (VIT D DEFICIENCY, FRACTURES): Vit D, 25-Hydroxy: 46 ng/mL (ref 30–89)

## 2011-04-22 ENCOUNTER — Encounter: Payer: Self-pay | Admitting: Family Medicine

## 2011-04-23 ENCOUNTER — Encounter: Payer: Self-pay | Admitting: Family Medicine

## 2011-04-23 ENCOUNTER — Ambulatory Visit (INDEPENDENT_AMBULATORY_CARE_PROVIDER_SITE_OTHER): Payer: BC Managed Care – PPO | Admitting: Family Medicine

## 2011-04-23 VITALS — BP 128/76 | HR 78 | Resp 18 | Ht 62.0 in | Wt 225.0 lb

## 2011-04-23 DIAGNOSIS — R7303 Prediabetes: Secondary | ICD-10-CM

## 2011-04-23 DIAGNOSIS — R7309 Other abnormal glucose: Secondary | ICD-10-CM

## 2011-04-23 DIAGNOSIS — R079 Chest pain, unspecified: Secondary | ICD-10-CM

## 2011-04-23 DIAGNOSIS — Z23 Encounter for immunization: Secondary | ICD-10-CM

## 2011-04-23 DIAGNOSIS — E785 Hyperlipidemia, unspecified: Secondary | ICD-10-CM

## 2011-04-23 DIAGNOSIS — I10 Essential (primary) hypertension: Secondary | ICD-10-CM

## 2011-04-23 MED ORDER — PHENTERMINE HCL 15 MG PO TBDP: 1.0000 | ORAL_TABLET | Freq: Every day | ORAL | Status: DC

## 2011-04-23 MED ORDER — PHENTERMINE HCL 37.5 MG PO TABS: 37.5000 mg | ORAL_TABLET | Freq: Every day | ORAL | Status: DC

## 2011-04-23 NOTE — Progress Notes (Signed)
  Subjective:    Patient ID: Caitlin Sampson, female    DOB: 1961-04-16, 50 y.o.   MRN: [id number]  HPI This past weekend on 2 occasions pt had episodes of chest pressure and a feeling of fullness, no specific aggravating or relieving factors, no accompanying symptoms. She has changed her diet , which is reflected positively in improved labs , however she is frustrated by her lack of weight loss and is requesting an appetite suppressant   Review of Systems See HPI Denies recent fever or chills. Denies sinus pressure, nasal congestion, ear pain or sore throat. Denies chest congestion, productive cough or wheezing. Denies  palpitations and leg swelling Denies abdominal pain, nausea, vomiting,diarrhea or constipation.   Denies dysuria, frequency, hesitancy or incontinence. Denies joint pain, swelling and limitation in mobility. Denies headaches, seizures, numbness, or tingling. Denies depression, anxiety or insomnia. Denies skin break down or rash.        Objective:   Physical Exam Patient alert and oriented and in no cardiopulmonary distress.  HEENT: No facial asymmetry, EOMI, no sinus tenderness,  oropharynx pink and moist.  Neck supple no adenopathy.  Chest: Clear to auscultation bilaterally.  CVS: S1, S2 no murmurs, no S3.  ABD: Soft non tender. Bowel sounds normal.  Ext: No edema  MS: Adequate ROM spine, shoulders, hips and knees.  Skin: Intact, no ulcerations or rash noted.  Psych: Good eye contact, normal affect. Memory intact not anxious or depressed appearing.  CNS: CN 2-12 intact, power, tone and sensation normal throughout.        Assessment & Plan:  N

## 2011-04-23 NOTE — Assessment & Plan Note (Signed)
Controlled, no change in medication  

## 2011-04-23 NOTE — Patient Instructions (Signed)
F/u in 8 weeks.  Start half phentermine once daily before breakfast, the bottle says one , but take HALF.  Remember the bad side effects discussed, stop med if these develop and let me know.  It is important that you exercise regularly at least 30 minutes 6 times a week. If you develop chest pain, have severe difficulty breathing, or feel very tired, stop exercising immediately and seek medical attention   A healthy diet is rich in fruit, vegetables and whole grains. Poultry fish, nuts and beans are a healthy choice for protein rather then red meat. A low sodium diet and drinking 64 ounces of water daily is generally recommended. Oils and sweet should be limited. Carbohydrates especially for those who are diabetic or overweight, should be limited to 30-45 gram per meal. It is important to eat on a regular schedule, at least 3 times daily. Snacks should be primarily fruits, vegetables or nuts.  Weight loss goal of at least 3 pounds per month  My fitness pal is free app that helps with calorie counting, pls use this  Flu vaccine and EKG today  Congrats on improved labs

## 2011-04-23 NOTE — Assessment & Plan Note (Signed)
Unchangedd. Patient re-educated about  the importance of commitment to a  minimum of 150 minutes of exercise per week. The importance of healthy food choices with portion control discussed. Encouraged to start a food diary, count calories and to consider  joining a support group. Sample diet sheets offered. Goals set by the patient for the next several months.  Will start phentermine

## 2011-04-27 NOTE — Assessment & Plan Note (Signed)
C/o recent left chest pain, non radiating , no accompanying nausea, diaphoresis, or light headedness, occurred at rest, office eKG negative for ischemia

## 2011-04-27 NOTE — Assessment & Plan Note (Signed)
Improved with dietary change, pt applauded on this and encouraged to continue same

## 2011-04-27 NOTE — Assessment & Plan Note (Signed)
Improved with dietary change, pt applauded on this and encouraged to continue same 

## 2011-04-28 ENCOUNTER — Telehealth: Payer: Self-pay

## 2011-04-28 MED ORDER — MECLIZINE HCL 25 MG PO TABS: 25.0000 mg | ORAL_TABLET | Freq: Three times a day (TID) | ORAL | Status: AC | PRN

## 2011-04-28 NOTE — Telephone Encounter (Signed)
Since she was here the last time she has had a touch of vertigo. She has had it severe in the past. She was out of work for 2 1/2 months in 2005. Its not that bad but she feels a lot of pressure in top of her head and behind her head. No sinus drainage. Just off balance and headache off and on and the right side of her head was sore to the touch yesterday. It comes and goes and has had some nausea with it. If you think it sounds like a sinus problem and she needs a scan she needs it done before Jan because she loses her insurance.

## 2011-04-28 NOTE — Telephone Encounter (Signed)
Sent med. Tried to call patient. No answer

## 2011-04-28 NOTE — Telephone Encounter (Signed)
No need for scan at this time pls send in meclizine 25mg  one 3 times daily as needed #30 refill zero for vertigo if she needs meds

## 2011-04-29 ENCOUNTER — Telehealth: Payer: Self-pay | Admitting: Family Medicine

## 2011-04-29 MED ORDER — AMOXICILLIN 500 MG PO CAPS: 500.0000 mg | ORAL_CAPSULE | Freq: Two times a day (BID) | ORAL | Status: AC

## 2011-04-29 NOTE — Telephone Encounter (Signed)
See previous message

## 2011-04-29 NOTE — Telephone Encounter (Signed)
Called patient, left message with her that med was sent in

## 2011-04-29 NOTE — Telephone Encounter (Signed)
Since she reports ear pain and fever, let her know and send in amoxicillin 500mg  twice daily for 10 days, 20 tabs no refills pls

## 2011-04-29 NOTE — Telephone Encounter (Signed)
Got Meclizine but now both ears are hurting and lastnight when she got home she had a 99.1 fever. Thinks its an ear infection causing the problem. What does she need to do. Front states no appts  Cell [id number] private line [phone]

## 2011-05-05 ENCOUNTER — Encounter: Payer: Self-pay | Admitting: Family Medicine

## 2011-06-05 ENCOUNTER — Encounter (HOSPITAL_COMMUNITY): Payer: Self-pay | Admitting: Dietician

## 2011-06-05 NOTE — Progress Notes (Signed)
Pt was a no-show for appointment scheduled 06/05/11 at 1 PM. Sent letter pt home notifying pt of no-show and requesting rescheduling appointment.

## 2011-06-26 ENCOUNTER — Encounter: Payer: Self-pay | Admitting: Family Medicine

## 2011-06-26 ENCOUNTER — Ambulatory Visit (INDEPENDENT_AMBULATORY_CARE_PROVIDER_SITE_OTHER): Payer: BC Managed Care – PPO | Admitting: Family Medicine

## 2011-06-26 ENCOUNTER — Telehealth: Payer: Self-pay | Admitting: Family Medicine

## 2011-06-26 ENCOUNTER — Other Ambulatory Visit: Payer: Self-pay | Admitting: Family Medicine

## 2011-06-26 VITALS — BP 122/80 | HR 84 | Temp 98.7°F | Resp 18 | Ht 62.0 in | Wt 224.0 lb

## 2011-06-26 DIAGNOSIS — R7303 Prediabetes: Secondary | ICD-10-CM

## 2011-06-26 DIAGNOSIS — J019 Acute sinusitis, unspecified: Secondary | ICD-10-CM

## 2011-06-26 DIAGNOSIS — R7309 Other abnormal glucose: Secondary | ICD-10-CM

## 2011-06-26 DIAGNOSIS — E785 Hyperlipidemia, unspecified: Secondary | ICD-10-CM

## 2011-06-26 DIAGNOSIS — J0100 Acute maxillary sinusitis, unspecified: Secondary | ICD-10-CM

## 2011-06-26 DIAGNOSIS — Z139 Encounter for screening, unspecified: Secondary | ICD-10-CM

## 2011-06-26 DIAGNOSIS — I10 Essential (primary) hypertension: Secondary | ICD-10-CM

## 2011-06-26 MED ORDER — FLUCONAZOLE 150 MG PO TABS: ORAL_TABLET | ORAL | Status: AC

## 2011-06-26 MED ORDER — BENZONATATE 100 MG PO CAPS: 100.0000 mg | ORAL_CAPSULE | Freq: Four times a day (QID) | ORAL | Status: DC | PRN

## 2011-06-26 MED ORDER — AZITHROMYCIN 250 MG PO TABS: ORAL_TABLET | ORAL | Status: AC

## 2011-06-26 MED ORDER — CEFTRIAXONE SODIUM 1 G IJ SOLR
500.0000 mg | Freq: Once | INTRAMUSCULAR | Status: AC
  Administered 2011-06-26: 500 mg via INTRAMUSCULAR

## 2011-06-26 NOTE — Assessment & Plan Note (Signed)
unchanged Patient re-educated about  the importance of commitment to a  minimum of 150 minutes of exercise per week. The importance of healthy food choices with portion control discussed. Encouraged to start a food diary, count calories and to consider  joining a support group. Sample diet sheets offered. Goals set by the patient for the next several months.    

## 2011-06-26 NOTE — Telephone Encounter (Signed)
Patient is aware 

## 2011-06-26 NOTE — Patient Instructions (Signed)
F/u in June.  Fasting CbC, cmp, lipid and HBa1C and TSH in June before visit.  You are treated for acute sinusitis, rocephin is administered in office and med is sent to the pharmacy   It is important that you exercise regularly at least 30 minutes 5 times a week. If you develop chest pain, have severe difficulty breathing, or feel very tired, stop exercising immediately and seek medical attention   A healthy diet is rich in fruit, vegetables and whole grains. Poultry fish, nuts and beans are a healthy choice for protein rather then red meat. A low sodium diet and drinking 64 ounces of water daily is generally recommended. Oils and sweet should be limited. Carbohydrates especially for those who are diabetic or overweight, should be limited to 34-45 gram per meal. It is important to eat on a regular schedule, at least 3 times daily. Snacks should be primarily fruits, vegetables or nuts.  Mammogram is past due, you need to schedule and get it done

## 2011-06-26 NOTE — Assessment & Plan Note (Signed)
Hyperlipidemia:Low fat diet discussed and encouraged.  Labs in June

## 2011-06-26 NOTE — Assessment & Plan Note (Signed)
Controlled, no change in medication  

## 2011-06-26 NOTE — Assessment & Plan Note (Signed)
Rept HBa1C in June before visit

## 2011-07-01 ENCOUNTER — Ambulatory Visit (HOSPITAL_COMMUNITY)
Admission: RE | Admit: 2011-07-01 | Discharge: 2011-07-01 | Disposition: A | Discharge status: Home / Self Care | Payer: BC Managed Care – PPO | Source: Ambulatory Visit | Attending: Family Medicine | Admitting: Family Medicine

## 2011-07-01 ENCOUNTER — Ambulatory Visit (HOSPITAL_COMMUNITY): Payer: BC Managed Care – PPO

## 2011-07-01 DIAGNOSIS — Z1231 Encounter for screening mammogram for malignant neoplasm of breast: Secondary | ICD-10-CM | POA: Insufficient documentation

## 2011-07-01 DIAGNOSIS — Z139 Encounter for screening, unspecified: Secondary | ICD-10-CM

## 2011-07-01 NOTE — Progress Notes (Signed)
  Subjective:    Patient ID: Caitlin Sampson, female    DOB: 1960-10-01, 51 y.o.   MRN: [id number]  HPI The PT is here for follow up and re-evaluation of chronic medical conditions, medication management and review of any available recent lab and radiology data.  Preventive health is updated, specifically  Cancer screening and Immunization.   Questions or concerns regarding consultations or procedures which the PT has had in the interim are  addressed. The PT denies any adverse reactions to current medications since the last visit.  There are no new concerns.  2 day h/o sinus pressure with yellow drainage and chills also tender neck glands    Review of Systems See HPI Denies chest congestion, productive cough or wheezing. Denies chest pains, palpitations and leg swelling Denies abdominal pain, nausea, vomiting,diarrhea or constipation.   Denies dysuria, frequency, hesitancy or incontinence. Denies joint pain, swelling and limitation in mobility. Denies headaches, seizures, numbness, or tingling. Denies depression, anxiety or insomnia. Denies skin break down or rash.        Objective:   Physical Exam Patient alert and oriented and in no cardiopulmonary distress.  HEENT: No facial asymmetry, EOMI, frontal and maxillary sinus tenderness,  oropharynx  Erythematous and  moist.  Neck supple anterior cervical  adenopathy.  Chest: Clear to auscultation bilaterally.  CVS: S1, S2 no murmurs, no S3.  ABD: Soft non tender. Bowel sounds normal.  Ext: No edema  MS: Adequate ROM spine, shoulders, hips and knees.  Skin: Intact, no ulcerations or rash noted.  Psych: Good eye contact, normal affect. Memory intact not anxious or depressed appearing.  CNS: CN 2-12 intact, power, tone and sensation normal throughout.       Assessment & Plan:

## 2011-07-01 NOTE — Assessment & Plan Note (Signed)
Antibiotic prescribed and administered 

## 2011-08-13 ENCOUNTER — Telehealth: Payer: Self-pay | Admitting: Family Medicine

## 2011-08-13 MED ORDER — ESOMEPRAZOLE MAGNESIUM 40 MG PO CPDR: 40.0000 mg | DELAYED_RELEASE_CAPSULE | Freq: Every day | ORAL | Status: DC

## 2011-08-13 NOTE — Telephone Encounter (Signed)
Printed for pickup.

## 2011-10-06 ENCOUNTER — Encounter: Payer: Self-pay | Admitting: Family Medicine

## 2011-10-06 ENCOUNTER — Ambulatory Visit (INDEPENDENT_AMBULATORY_CARE_PROVIDER_SITE_OTHER): Payer: BC Managed Care – PPO | Admitting: Family Medicine

## 2011-10-06 VITALS — BP 128/84 | HR 82 | Resp 15 | Ht 62.0 in | Wt 229.8 lb

## 2011-10-06 DIAGNOSIS — H539 Unspecified visual disturbance: Secondary | ICD-10-CM

## 2011-10-06 DIAGNOSIS — I10 Essential (primary) hypertension: Secondary | ICD-10-CM

## 2011-10-06 DIAGNOSIS — R7309 Other abnormal glucose: Secondary | ICD-10-CM

## 2011-10-06 DIAGNOSIS — R002 Palpitations: Secondary | ICD-10-CM

## 2011-10-06 DIAGNOSIS — R42 Dizziness and giddiness: Secondary | ICD-10-CM

## 2011-10-06 DIAGNOSIS — R7303 Prediabetes: Secondary | ICD-10-CM

## 2011-10-06 LAB — POCT URINALYSIS DIPSTICK
Bilirubin, UA: NEGATIVE
Nitrite, UA: NEGATIVE
Spec Grav, UA: 1.025
Urobilinogen, UA: 0.2
pH, UA: 6

## 2011-10-06 LAB — GLUCOSE, POCT (MANUAL RESULT ENTRY): POC Glucose: 101 mg/dl — AB (ref 70–99)

## 2011-10-06 NOTE — Patient Instructions (Signed)
Get the blood work drawn Try to eat 5 small meals to day to keep your blood sugar stable Exercise or walk as much as possible We will call with lab results F/U with Dr. Lodema Hong as scheduled

## 2011-10-06 NOTE — Progress Notes (Signed)
  Subjective:    Patient ID: Caitlin Sampson, female    DOB: [birth year], 51 y.o.   MRN: [id number]  HPI  Pt presents with episode of blurred, vision, this AM she felt shakey at work, she had eaten a small meal, but around 10am she felt queasy and shaky her friend to her CBG and it was around 88. She ate a small snack afterwards, but did not feel better until a little later where she ate more. She has a previous episode a few weeks before but her heart was racing at that time, after the episodes she felt very drained and fatigued. Denies current CP, SOB, fever, recent illness, LOC, HA   Review of Systems   GEN- denies fatigue, fever, weight loss,weakness, recent illness HEENT- denies eye drainage, change in vision, nasal discharge, CVS- denies chest pain, + palpitations RESP- denies SOB, cough, wheeze ABD- denies N/V, change in stools, abd pain GU- denies dysuria, hematuria, dribbling, incontinence MSK- denies joint pain, muscle aches, injury Neuro- denies headache, dizziness, syncope, seizure activity      Objective:   Physical Exam GEN- NAD, alert and oriented x3 HEENT- PERRL, EOMI, non injected sclera, pink conjunctiva, MMM, oropharynx clear, fundoscopic exam benign, no nystagmus Neck- Supple, no thryomegaly CVS- RRR, no murmur RESP-CTAB EXT- No edema Pulses- Radial, DP- 2+ NEURO- CN II-XII, in tact, normal gait, no focal deficits EKG- NSR, no arrythemia, read as digoxin effect in lead I and II     Assessment & Plan:

## 2011-10-07 LAB — CBC
HCT: 39.2 % (ref 36.0–46.0)
MCH: 28.5 pg (ref 26.0–34.0)
MCHC: 33.4 g/dL (ref 30.0–36.0)
MCV: 85.2 fL (ref 78.0–100.0)
RDW: 14.6 % (ref 11.5–15.5)

## 2011-10-07 LAB — HEMOGLOBIN A1C: Hgb A1c MFr Bld: 6.1 % — ABNORMAL HIGH (ref <5.7)

## 2011-10-08 LAB — LIPID PANEL
HDL: 65 mg/dL (ref >39)
LDL Cholesterol: 90 mg/dL (ref 0–99)
Total CHOL/HDL Ratio: 2.9 Ratio
VLDL: 34 mg/dL (ref 0–40)

## 2011-10-08 LAB — COMPREHENSIVE METABOLIC PANEL
AST: 20 U/L (ref 0–37)
Alkaline Phosphatase: 74 U/L (ref 39–117)
BUN: 13 mg/dL (ref 6–23)
Creat: 0.65 mg/dL (ref 0.50–1.10)
Potassium: 3.6 mEq/L (ref 3.5–5.3)
Total Bilirubin: 0.4 mg/dL (ref 0.3–1.2)

## 2011-10-09 ENCOUNTER — Telehealth: Payer: Self-pay | Admitting: Family Medicine

## 2011-10-09 DIAGNOSIS — H539 Unspecified visual disturbance: Secondary | ICD-10-CM | POA: Insufficient documentation

## 2011-10-09 DIAGNOSIS — R002 Palpitations: Secondary | ICD-10-CM | POA: Insufficient documentation

## 2011-10-09 DIAGNOSIS — R42 Dizziness and giddiness: Secondary | ICD-10-CM | POA: Insufficient documentation

## 2011-10-09 NOTE — Assessment & Plan Note (Signed)
Unclear cause she exam normal, she will have labs drawn I see no change in neurological exam

## 2011-10-09 NOTE — Assessment & Plan Note (Signed)
EKG reassuring, her palpitations have resolved, this was last week, she is not on digoxin to explain the ? Digitalis effect in the isolated lead

## 2011-10-09 NOTE — Telephone Encounter (Signed)
Pt aware.

## 2011-10-09 NOTE — Telephone Encounter (Signed)
Please let pt know her labs look good. Her electrolytes are normal Her CBC is normal she is not anemic Her cholesterol looks good with exception of fatty acids being a little high at 170, she can decrease this by changing her diet- low fat, no fried foods, she can also try fish oil 1000mg  daily Thyroid was normal She is still pre-diabetic her A1C is 6.1% last time 6%  Nothing in her labs that I see caused her episode the other day.

## 2011-10-09 NOTE — Assessment & Plan Note (Signed)
Self limited, I wonder if this was secondary to hypoglycemia, labs to be done, vision normal at this time

## 2011-10-09 NOTE — Telephone Encounter (Signed)
Called and left voicemail for pt to return call. 

## 2011-10-16 ENCOUNTER — Encounter: Payer: Self-pay | Admitting: Family Medicine

## 2011-10-30 ENCOUNTER — Encounter: Payer: Self-pay | Admitting: Family Medicine

## 2011-10-30 ENCOUNTER — Ambulatory Visit (INDEPENDENT_AMBULATORY_CARE_PROVIDER_SITE_OTHER): Payer: BC Managed Care – PPO | Admitting: Family Medicine

## 2011-10-30 VITALS — BP 134/82 | HR 87 | Resp 18 | Ht 62.0 in | Wt 227.1 lb

## 2011-10-30 DIAGNOSIS — F411 Generalized anxiety disorder: Secondary | ICD-10-CM | POA: Insufficient documentation

## 2011-10-30 DIAGNOSIS — R7309 Other abnormal glucose: Secondary | ICD-10-CM

## 2011-10-30 DIAGNOSIS — R7303 Prediabetes: Secondary | ICD-10-CM

## 2011-10-30 DIAGNOSIS — I10 Essential (primary) hypertension: Secondary | ICD-10-CM

## 2011-10-30 DIAGNOSIS — R51 Headache: Secondary | ICD-10-CM

## 2011-10-30 DIAGNOSIS — E785 Hyperlipidemia, unspecified: Secondary | ICD-10-CM

## 2011-10-30 DIAGNOSIS — R519 Headache, unspecified: Secondary | ICD-10-CM | POA: Insufficient documentation

## 2011-10-30 MED ORDER — FLUOXETINE HCL 10 MG PO CAPS: 10.0000 mg | ORAL_CAPSULE | Freq: Every day | ORAL | Status: DC

## 2011-10-30 NOTE — Progress Notes (Signed)
  Subjective:    Patient ID: Caitlin Sampson, female    DOB: 1961/04/13, 51 y.o.   MRN: [id number]  HPI The PT is here for follow up and re-evaluation of chronic medical conditions, medication management and review of any available recent lab and radiology data. Recently presented to the office from work with acute blurry vision, and lightheadedness. She also reports increased headache frequency in the past 4 to 6 months, and intermittent numbness in different parts of her body. There is no known family h/o neurologic disease. Preventive health is updated, specifically  Cancer screening and Immunization.   Questions or concerns regarding consultations or procedures which the PT has had in the interim are  addressed. The PT denies any adverse reactions to current medications since the last visit.  She does report increased anxiety, and states in the past she has been on prozac with good results. She denies depression, suicidal or homicidal thoughts    Review of Systems See HPI Denies recent fever or chills. Denies sinus pressure, nasal congestion, ear pain or sore throat. Denies chest congestion, productive cough or wheezing. Denies chest pains, palpitations and leg swelling Denies abdominal pain, nausea, vomiting,diarrhea or constipation.   Denies dysuria, frequency, hesitancy or incontinence. Denies joint pain, swelling and limitation in mobility.  Denies skin break down or rash.        Objective:   Physical Exam Patient alert and oriented and in no cardiopulmonary distress.  HEENT: No facial asymmetry, EOMI, no sinus tenderness,  oropharynx pink and moist.  Neck supple no adenopathy.  Chest: Clear to auscultation bilaterally.  CVS: S1, S2 no murmurs, no S3.  ABD: Soft non tender. Bowel sounds normal.  Ext: No edema  MS: Adequate ROM spine, shoulders, hips and knees.  Skin: Intact, no ulcerations or rash noted.  Psych: Good eye contact, normal affect. Memory intact  anxious not depressed appearing.  CNS: CN 2-12 intact, power, tone and sensation normal throughout.        Assessment & Plan:

## 2011-10-30 NOTE — Patient Instructions (Addendum)
F/u in 3.5 month  HBA1C in 3.5 month  Please commit to exercise 5 days per week, and also the 1500 calorie per day eating plan  Weight loss goal of 3 pounds per month   You are referred for a brain scan.  New medication for anxiety , this will help a lot  Labs are reviewed today, triglycerides are slightly up, please cut back on eggs, also blood sugar average is higher, pls work on weight loss.

## 2011-11-03 NOTE — Assessment & Plan Note (Signed)
Deterioration, HBA1C has increased, weight loss through lifestyle change stressed

## 2011-11-03 NOTE — Assessment & Plan Note (Signed)
Increased frequency and severity of headaches in the past 4 to 6 months, needs brain scan

## 2011-11-03 NOTE — Assessment & Plan Note (Signed)
Hyperlipidemia:Low fat diet discussed and encouraged.  Triglycerides are elevated, labs are otherwise excellent, no med change, dietary change stressed

## 2011-11-03 NOTE — Assessment & Plan Note (Signed)
Uncontrolled, multiple somatic complaints, and increased anxiety, has responded well to prozac in the past, will prescribe

## 2011-11-03 NOTE — Assessment & Plan Note (Signed)
Controlled, no change in medication  

## 2011-11-04 ENCOUNTER — Telehealth: Payer: Self-pay | Admitting: Family Medicine

## 2011-11-05 ENCOUNTER — Other Ambulatory Visit: Payer: Self-pay | Admitting: Family Medicine

## 2011-11-05 ENCOUNTER — Ambulatory Visit (HOSPITAL_COMMUNITY)
Admission: RE | Admit: 2011-11-05 | Discharge: 2011-11-05 | Disposition: A | Discharge status: Home / Self Care | Payer: BC Managed Care – PPO | Source: Ambulatory Visit | Attending: Family Medicine | Admitting: Family Medicine

## 2011-11-05 DIAGNOSIS — H538 Other visual disturbances: Secondary | ICD-10-CM | POA: Insufficient documentation

## 2011-11-05 DIAGNOSIS — R51 Headache: Secondary | ICD-10-CM | POA: Insufficient documentation

## 2011-11-07 NOTE — Telephone Encounter (Signed)
Patient is aware 

## 2011-11-25 ENCOUNTER — Telehealth: Payer: Self-pay | Admitting: Family Medicine

## 2011-11-27 ENCOUNTER — Other Ambulatory Visit: Payer: Self-pay

## 2011-11-27 MED ORDER — ESOMEPRAZOLE MAGNESIUM 40 MG PO CPDR: 40.0000 mg | DELAYED_RELEASE_CAPSULE | Freq: Every day | ORAL | Status: DC

## 2011-11-28 NOTE — Telephone Encounter (Signed)
Patient aware it was good and sent in refill

## 2011-12-08 ENCOUNTER — Telehealth: Payer: Self-pay | Admitting: Family Medicine

## 2011-12-09 ENCOUNTER — Telehealth: Payer: Self-pay | Admitting: Family Medicine

## 2011-12-09 NOTE — Telephone Encounter (Signed)
Patient aware. Refills sent in

## 2011-12-09 NOTE — Telephone Encounter (Signed)
Pt meds sent in

## 2011-12-16 ENCOUNTER — Other Ambulatory Visit: Payer: Self-pay

## 2011-12-16 ENCOUNTER — Telehealth: Payer: Self-pay | Admitting: Family Medicine

## 2011-12-16 MED ORDER — VALSARTAN-HYDROCHLOROTHIAZIDE 320-25 MG PO TABS: 0.5000 | ORAL_TABLET | Freq: Every day | ORAL | Status: DC

## 2011-12-17 NOTE — Telephone Encounter (Signed)
Will call [facility]

## 2011-12-17 NOTE — Telephone Encounter (Signed)
Called pt and she needs for someone to call [facility] and notify them that she is able to take the diovan even though  she has a listed allergy to sulfa.

## 2011-12-18 NOTE — Telephone Encounter (Signed)
Response faxed back to pharmacy.

## 2011-12-24 ENCOUNTER — Telehealth: Payer: Self-pay | Admitting: Family Medicine

## 2011-12-24 NOTE — Telephone Encounter (Signed)
Needs appt tomorrow

## 2011-12-24 NOTE — Telephone Encounter (Signed)
Needs ov, work in [employer] account [employer]

## 2011-12-24 NOTE — Telephone Encounter (Signed)
Is there something that you generally give her for this or does she need OV?

## 2011-12-25 ENCOUNTER — Ambulatory Visit (HOSPITAL_COMMUNITY)
Admission: RE | Admit: 2011-12-25 | Discharge: 2011-12-25 | Disposition: A | Discharge status: Home / Self Care | Payer: BC Managed Care – PPO | Source: Ambulatory Visit | Attending: Family Medicine | Admitting: Family Medicine

## 2011-12-25 ENCOUNTER — Ambulatory Visit (INDEPENDENT_AMBULATORY_CARE_PROVIDER_SITE_OTHER): Payer: BC Managed Care – PPO | Admitting: Family Medicine

## 2011-12-25 ENCOUNTER — Encounter: Payer: Self-pay | Admitting: Family Medicine

## 2011-12-25 VITALS — BP 132/86 | HR 90 | Resp 18 | Ht 62.0 in | Wt 225.0 lb

## 2011-12-25 DIAGNOSIS — R1031 Right lower quadrant pain: Secondary | ICD-10-CM

## 2011-12-25 DIAGNOSIS — Z1211 Encounter for screening for malignant neoplasm of colon: Secondary | ICD-10-CM

## 2011-12-25 DIAGNOSIS — R109 Unspecified abdominal pain: Secondary | ICD-10-CM | POA: Insufficient documentation

## 2011-12-25 DIAGNOSIS — E876 Hypokalemia: Secondary | ICD-10-CM

## 2011-12-25 DIAGNOSIS — I10 Essential (primary) hypertension: Secondary | ICD-10-CM

## 2011-12-25 DIAGNOSIS — E785 Hyperlipidemia, unspecified: Secondary | ICD-10-CM

## 2011-12-25 DIAGNOSIS — R52 Pain, unspecified: Secondary | ICD-10-CM

## 2011-12-25 DIAGNOSIS — K5732 Diverticulitis of large intestine without perforation or abscess without bleeding: Secondary | ICD-10-CM

## 2011-12-25 DIAGNOSIS — R110 Nausea: Secondary | ICD-10-CM | POA: Insufficient documentation

## 2011-12-25 DIAGNOSIS — K5792 Diverticulitis of intestine, part unspecified, without perforation or abscess without bleeding: Secondary | ICD-10-CM | POA: Insufficient documentation

## 2011-12-25 DIAGNOSIS — N3000 Acute cystitis without hematuria: Secondary | ICD-10-CM

## 2011-12-25 DIAGNOSIS — F411 Generalized anxiety disorder: Secondary | ICD-10-CM

## 2011-12-25 DIAGNOSIS — K5730 Diverticulosis of large intestine without perforation or abscess without bleeding: Secondary | ICD-10-CM | POA: Insufficient documentation

## 2011-12-25 DIAGNOSIS — R1032 Left lower quadrant pain: Secondary | ICD-10-CM | POA: Insufficient documentation

## 2011-12-25 LAB — CBC WITH DIFFERENTIAL/PLATELET
Basophils Relative: 0 % (ref 0–1)
HCT: 38.5 % (ref 36.0–46.0)
Hemoglobin: 12.9 g/dL (ref 12.0–15.0)
Lymphocytes Relative: 32 % (ref 12–46)
Lymphs Abs: 2.3 10*3/uL (ref 0.7–4.0)
Monocytes Absolute: 0.6 10*3/uL (ref 0.1–1.0)
Monocytes Relative: 9 % (ref 3–12)
Neutro Abs: 4.3 10*3/uL (ref 1.7–7.7)
Neutrophils Relative %: 57 % (ref 43–77)
RBC: 4.47 MIL/uL (ref 3.87–5.11)
WBC: 7.4 10*3/uL (ref 4.0–10.5)

## 2011-12-25 LAB — BASIC METABOLIC PANEL
CO2: 29 mEq/L (ref 19–32)
Chloride: 99 mEq/L (ref 96–112)
Glucose, Bld: 106 mg/dL — ABNORMAL HIGH (ref 70–99)
Potassium: 3.3 mEq/L — ABNORMAL LOW (ref 3.5–5.3)
Sodium: 137 mEq/L (ref 135–145)

## 2011-12-25 LAB — POCT URINALYSIS DIPSTICK
Bilirubin, UA: NEGATIVE
Glucose, UA: NEGATIVE
Ketones, UA: NEGATIVE
Leukocytes, UA: NEGATIVE
Protein, UA: NEGATIVE

## 2011-12-25 LAB — POC HEMOCCULT BLD/STL (OFFICE/1-CARD/DIAGNOSTIC): Fecal Occult Blood, POC: NEGATIVE

## 2011-12-25 MED ORDER — FLUCONAZOLE 150 MG PO TABS: ORAL_TABLET | ORAL | Status: AC

## 2011-12-25 MED ORDER — PROMETHAZINE HCL 25 MG PO TABS: 25.0000 mg | ORAL_TABLET | Freq: Three times a day (TID) | ORAL | Status: DC | PRN

## 2011-12-25 MED ORDER — IOHEXOL 300 MG/ML  SOLN
100.0000 mL | Freq: Once | INTRAMUSCULAR | Status: AC | PRN
  Administered 2011-12-25: 100 mL via INTRAVENOUS

## 2011-12-25 MED ORDER — FLUOXETINE HCL 20 MG PO CAPS: 20.0000 mg | ORAL_CAPSULE | Freq: Every day | ORAL | Status: DC

## 2011-12-25 MED ORDER — CIPROFLOXACIN HCL 500 MG PO TABS: 500.0000 mg | ORAL_TABLET | Freq: Two times a day (BID) | ORAL | Status: AC

## 2011-12-25 NOTE — Progress Notes (Signed)
  Subjective:    Patient ID: Caitlin Sampson, female    DOB: 16-Jan-1961, 51 y.o.   MRN: [id number]  HPI Pt  States she was nauseated last week Thursday, has not been eating much since last Thursday. No emesis. Increased stool frequency, 3 loose stools, instead of formed. Suprapubic pain and tenderness since yesterday, then went to left lower quadrant, has also noted frequency in urine, also, small quantities at times. No fever documented, few chills   Review of Systems See HPI  Denies sinus pressure, nasal congestion, ear pain or sore throat. Denies chest congestion, productive cough or wheezing. Denies chest pains, palpitations and leg swelling  Denies joint pain, swelling and limitation in mobility. Denies headaches, seizures, numbness, or tingling. Denies  Depression,has chronic anxiety , denies  Insomnia.states she found old prozac bottle and dose was 40mg  Denies skin break down or rash.        Objective:   Physical Exam Patient alert and oriented and in no cardiopulmonary distress.Anxious HEENT: No facial asymmetry, EOMI, no sinus tenderness,  oropharynx pink and moist.  Neck supple no adenopathy.  Chest: Clear to auscultation bilaterally.  CVS: S1, S2 no murmurs, no S3.  ABD: Soft bilateral lower quadrant tenderness with guarding, no rebound. No organomegaly or masses. Bowel sounds normal.Suprapubic tenderness, no renal angle tenderness  Rectal: tender on exam, guaiac negative stool Ext: No edema  MS: Adequate ROM spine, shoulders, hips and knees.  Skin: Intact, no ulcerations or rash noted.  Psych: Good eye contact, normal affect. Memory intact not anxious or depressed appearing.  CNS: CN 2-12 intact, power, tone and sensation normal throughout.        Assessment & Plan:

## 2011-12-25 NOTE — Patient Instructions (Addendum)
F/u as before  You need abdominal and pelvic scan, I believe you are having flare of diverticulitis, andwill refer you for a scan today.  Ciprofloxacin is prescribed for 7 day, sent to [facility]. You will be handed scripts for phenergan for nausea, fluconazole for vaginal itch, both are to be filled if needed.  Dose increase in fluoxetine to 20mg  daily, as discussed, OK to take TWO 10mg  tablets till done  Labs today as stat, for test, chem 7, and cbc and diff  Drink clear liquids and rest your bowel as much as possible ( scant amount of solid foods for the next 3 to 5 days till you feel better

## 2011-12-26 ENCOUNTER — Other Ambulatory Visit: Payer: Self-pay

## 2011-12-26 DIAGNOSIS — E876 Hypokalemia: Secondary | ICD-10-CM

## 2011-12-26 MED ORDER — POTASSIUM CHLORIDE ER 10 MEQ PO TBCR: 10.0000 meq | EXTENDED_RELEASE_TABLET | Freq: Two times a day (BID) | ORAL | Status: DC

## 2011-12-26 NOTE — Assessment & Plan Note (Signed)
Hyperlipidemia:Low fat diet discussed and encouraged.  Triglycerides elevated, no change in meds

## 2011-12-26 NOTE — Assessment & Plan Note (Signed)
Controlled, no change in medication DASH diet and commitment to daily physical activity for a minimum of 30 minutes discussed and encouraged, as a part of hypertension management. The importance of attaining a healthy weight is also discussed.  

## 2011-12-26 NOTE — Assessment & Plan Note (Signed)
Known diverticulosis, need to furhter eval for acute appendicitis or acute diverticulitis

## 2011-12-26 NOTE — Assessment & Plan Note (Signed)
Minimal improvement, per pt reporting, increase prozac to 20mg 

## 2011-12-26 NOTE — Assessment & Plan Note (Signed)
Symptomatic for infection,CCUA checked and  Is normal

## 2011-12-29 LAB — URINE CULTURE

## 2012-01-14 ENCOUNTER — Telehealth: Payer: Self-pay | Admitting: Family Medicine

## 2012-01-14 DIAGNOSIS — I2510 Atherosclerotic heart disease of native coronary artery without angina pectoris: Secondary | ICD-10-CM

## 2012-01-14 NOTE — Telephone Encounter (Signed)
Pls contact pt , let her know the scan which she recently had showed more thickening in her coronary arteries, than would be expected for her age.  She needs to focus on low cholesterol  Diet rich in fruit and vegetable, commit to 30 minutes daily of exercise and lose weight . I am referring her to cardiology for eval of this , the grp she has seen in the past, and suggest she start low dose aspirin 81 mg one daily till seen by them and we will move forward with any specific recommendations about the need or otherwise to continue aspirin.  PLEASE let referral staff know which card grp she wants to see  Any questions, pls let me know  Referral staff, pLs refer to card requested by pt

## 2012-01-14 NOTE — Telephone Encounter (Signed)
Patient wants to see Dr Tresa Endo at [facility]

## 2012-01-19 NOTE — Telephone Encounter (Signed)
pls refer pt to Dr Tresa Endo at [facility] cardiology re assesment for cardiovascular disease, had abn scan recently pls fax report. THIS S IMPORTANT

## 2012-01-20 NOTE — Telephone Encounter (Signed)
Pt has appt with dr. Tresa Endo for 01/30/2012 2:30. Pt is aware

## 2012-04-06 ENCOUNTER — Other Ambulatory Visit (HOSPITAL_COMMUNITY): Payer: Self-pay | Admitting: Cardiovascular Disease

## 2012-04-06 DIAGNOSIS — R079 Chest pain, unspecified: Secondary | ICD-10-CM

## 2012-04-27 ENCOUNTER — Telehealth: Payer: Self-pay | Admitting: Family Medicine

## 2012-04-27 ENCOUNTER — Other Ambulatory Visit: Payer: Self-pay

## 2012-04-27 MED ORDER — ESOMEPRAZOLE MAGNESIUM 40 MG PO CPDR: 40.0000 mg | DELAYED_RELEASE_CAPSULE | Freq: Every day | ORAL | Status: DC

## 2012-04-27 NOTE — Telephone Encounter (Signed)
Med refilled.

## 2012-04-28 ENCOUNTER — Encounter (HOSPITAL_COMMUNITY): Payer: BC Managed Care – PPO

## 2012-04-28 ENCOUNTER — Ambulatory Visit (HOSPITAL_COMMUNITY)
Admission: RE | Admit: 2012-04-28 | Discharge: 2012-04-28 | Disposition: A | Discharge status: Home / Self Care | Payer: BC Managed Care – PPO | Source: Ambulatory Visit | Attending: Cardiovascular Disease | Admitting: Cardiovascular Disease

## 2012-04-28 DIAGNOSIS — R079 Chest pain, unspecified: Secondary | ICD-10-CM | POA: Insufficient documentation

## 2012-04-28 DIAGNOSIS — E785 Hyperlipidemia, unspecified: Secondary | ICD-10-CM | POA: Insufficient documentation

## 2012-04-28 DIAGNOSIS — I10 Essential (primary) hypertension: Secondary | ICD-10-CM | POA: Insufficient documentation

## 2012-04-28 DIAGNOSIS — R002 Palpitations: Secondary | ICD-10-CM | POA: Insufficient documentation

## 2012-04-28 DIAGNOSIS — R0609 Other forms of dyspnea: Secondary | ICD-10-CM | POA: Insufficient documentation

## 2012-04-28 DIAGNOSIS — Z87891 Personal history of nicotine dependence: Secondary | ICD-10-CM | POA: Insufficient documentation

## 2012-04-28 DIAGNOSIS — E669 Obesity, unspecified: Secondary | ICD-10-CM | POA: Insufficient documentation

## 2012-04-28 DIAGNOSIS — R0989 Other specified symptoms and signs involving the circulatory and respiratory systems: Secondary | ICD-10-CM | POA: Insufficient documentation

## 2012-04-28 MED ORDER — TECHNETIUM TC 99M SESTAMIBI GENERIC - CARDIOLITE
30.4000 | Freq: Once | INTRAVENOUS | Status: AC | PRN
  Administered 2012-04-28: 30 via INTRAVENOUS

## 2012-04-28 MED ORDER — TECHNETIUM TC 99M SESTAMIBI GENERIC - CARDIOLITE
11.0000 | Freq: Once | INTRAVENOUS | Status: AC | PRN
  Administered 2012-04-28: 11 via INTRAVENOUS

## 2012-04-28 NOTE — Procedures (Addendum)
[facility] [address] [place] [address] [place] [address] [phone]  Cardiology Nuclear Med Study  JRUE YAMBAO is a 51 y.o. female     MRN : [id number]     DOB: 05/06/1961  Procedure Date: 04/28/2012  Nuclear Med Background Indication for Stress Test:  Evaluation for Ischemia History:  No prior cardiac history Cardiac Risk Factors: History of Smoking, Hypertension, Lipids and Obesity  Symptoms:  Chest Pain, DOE and Palpitations   Nuclear Pre-Procedure Caffeine/Decaff Intake:  10:00pm NPO After: 7:30am   IV Site: R Antecubital  IV 0.9% NS with Angio Cath:  22g  Chest Size (in):  n/a IV Started by: Koren Shiver, CNMT  Height: 5\' 2"  (1.575 m)  Cup Size: DD  BMI:  Body mass index is 41.15 kg/(m^2). Weight:  225 lb (102.059 kg)   Tech Comments:  n/a    Nuclear Med Study 1 or 2 day study: 1 day  Stress Test Type:  Stress  Order Authorizing Provider:  Nicki Guadalajara, MD   Resting Radionuclide: Technetium 31m Sestamibi  Resting Radionuclide Dose: 11 mCi   Stress Radionuclide:  Technetium 75m Sestamibi  Stress Radionuclide Dose: 30.4 mCi           Stress Protocol Rest HR: 74 Stress HR: 171  Rest BP: 140/94 Stress BP: 182/90  Exercise Time (min): 07:35 METS: 9.40          Dose of Adenosine (mg):  n/a Dose of Lexiscan: n/a mg  Dose of Atropine (mg): n/a Dose of Dobutamine: n/a mcg/kg/min (at max HR)  Stress Test Technologist: Ernestene Mention, CCT Nuclear Technologist: Koren Shiver, CNMT   Rest Procedure:  Myocardial perfusion imaging was performed at rest 45 minutes following the intravenous administration of Technetium 62m Sestamibi. Stress Procedure:  The patient performed treadmill exercise using a Bruce  Protocol for 7 minutes and 35 seconds. The patient stopped due to shortness of breath and fatigue but no chest pain.  There were no significant ST-T wave changes.  Technetium 25m Sestamibi was injected at peak exercise and  myocardial perfusion imaging was performed after a brief delay.  Transient Ischemic Dilatation (Normal <1.22):  0.78 Lung/Heart Ratio (Normal <0.45):  0.27 QGS EDV:  55 ml QGS ESV:  11 ml LV Ejection Fraction: 79%  Signed by     Rest ECG: NSR with non-specific ST-T wave changes  Stress ECG: No significant ST segment change suggestive of ischemia. There are scattered PVCs.  QPS Raw Data Images:  Normal; no motion artifact; normal heart/lung ratio. Stress Images:  Normal homogeneous uptake in all areas of the myocardium. Rest Images:  Normal homogeneous uptake in all areas of the myocardium. Subtraction (SDS):  Normal  Impression Exercise Capacity:  Good exercise capacity. BP Response:  Normal blood pressure response. Clinical Symptoms:  Mild shortness of breath without chest pain. ECG Impression:  No significant ST segment change suggestive of ischemia. Comparison with Prior Nuclear Study: No significant change from previous study  Overall Impression:  Normal stress nuclear study. Low risk stress nuclear study.  LV Wall Motion:  NL LV Function, EF 79%; NL Wall Motion   KELLY,THOMAS A, MD  04/28/2012 3:55 PM

## 2012-05-26 ENCOUNTER — Telehealth: Payer: Self-pay | Admitting: Family Medicine

## 2012-05-26 NOTE — Telephone Encounter (Signed)
Schedule tomorrow is full, some on the schedule may no show may be able to work her in , or just sched for Friday is fine with me , whichever she prefers

## 2012-05-26 NOTE — Telephone Encounter (Signed)
Patient c/o a tickle in her throat on the right side for a few weeks- wakes her up at night coughing. Now she thinks she has pulled something in the right side of her back, hurts when she breaths deep or coughs. Wanted to be seen but no appts tomorrow. Want to work in somewhere or see Friday?

## 2012-05-26 NOTE — Telephone Encounter (Signed)
Called to find out more info on her symptoms

## 2012-05-28 ENCOUNTER — Ambulatory Visit: Payer: BC Managed Care – PPO | Admitting: Family Medicine

## 2012-06-10 ENCOUNTER — Ambulatory Visit (INDEPENDENT_AMBULATORY_CARE_PROVIDER_SITE_OTHER): Payer: BC Managed Care – PPO | Admitting: Family Medicine

## 2012-06-10 ENCOUNTER — Encounter: Payer: Self-pay | Admitting: Family Medicine

## 2012-06-10 VITALS — BP 140/88 | HR 80 | Temp 99.2°F | Resp 16 | Ht 62.0 in | Wt 231.0 lb

## 2012-06-10 DIAGNOSIS — J0100 Acute maxillary sinusitis, unspecified: Secondary | ICD-10-CM

## 2012-06-10 DIAGNOSIS — R7309 Other abnormal glucose: Secondary | ICD-10-CM

## 2012-06-10 DIAGNOSIS — E785 Hyperlipidemia, unspecified: Secondary | ICD-10-CM

## 2012-06-10 DIAGNOSIS — F411 Generalized anxiety disorder: Secondary | ICD-10-CM

## 2012-06-10 DIAGNOSIS — R7303 Prediabetes: Secondary | ICD-10-CM

## 2012-06-10 DIAGNOSIS — I10 Essential (primary) hypertension: Secondary | ICD-10-CM

## 2012-06-10 DIAGNOSIS — K219 Gastro-esophageal reflux disease without esophagitis: Secondary | ICD-10-CM

## 2012-06-10 DIAGNOSIS — J040 Acute laryngitis: Secondary | ICD-10-CM

## 2012-06-10 DIAGNOSIS — J40 Bronchitis, not specified as acute or chronic: Secondary | ICD-10-CM

## 2012-06-10 LAB — HEMOGLOBIN A1C: Hgb A1c MFr Bld: 6.5 % — ABNORMAL HIGH (ref <5.7)

## 2012-06-10 MED ORDER — CHLORPHENIRAMINE-HYDROCODONE 8-10 MG/5ML PO LQCR: 5.0000 mL | Freq: Two times a day (BID) | ORAL | Status: DC | PRN

## 2012-06-10 MED ORDER — PREDNISONE (PAK) 5 MG PO TABS: 5.0000 mg | ORAL_TABLET | ORAL | Status: DC

## 2012-06-10 MED ORDER — AZITHROMYCIN 250 MG PO TABS: ORAL_TABLET | ORAL | Status: AC

## 2012-06-10 MED ORDER — METHYLPREDNISOLONE ACETATE 80 MG/ML IJ SUSP
80.0000 mg | Freq: Once | INTRAMUSCULAR | Status: AC
Start: 2012-06-10 — End: 2012-06-10
  Administered 2012-06-10: 80 mg via INTRAMUSCULAR

## 2012-06-10 NOTE — Patient Instructions (Addendum)
F/u in 4 month  You are treated for allergic cough, sinusitis and laryngitis.  Depo medrol in the office, prednisone dose pack, z pack and cough suppressant also prescribed.  Please take potassium as prescribed.  hBA1C today.  Fasting lipid, cmp and EGFR hBA1C , tSh in 4 month  You will get a 1500 cal diet, also please go back to diabetic class so you are reminded re food choice  and portion size.  Weight loss goal of 3 to 4 pounds per month  Please commit to daily physical ACTIVITY OF 30 MINUTES.  Work excuse from 01/23 to return 06/14/2012

## 2012-06-10 NOTE — Progress Notes (Signed)
  Subjective:    Patient ID: Caitlin Sampson, female    DOB: 1961/01/03, 52 y.o.   MRN: [id number]  HPI 3 day h/o sinus pressure, green nasal drainage, voice loss x 2 days, dry cough x 2 days Has had chills , but no documented fever Pt reports less stress, her husband's job has improved, and her only son will soon graduate from [employer] school She has not been consistent in weight loss and exercise effort, but intends to change this   Review of Systems See HPI  Denies chest pains, palpitations and leg swelling Denies abdominal pain, nausea, vomiting,diarrhea or constipation.   Denies dysuria, frequency, hesitancy or incontinence. Denies joint pain, swelling and limitation in mobility. Denies headaches, seizures, numbness, or tingling. Denies depression, anxiety or insomnia. Denies skin break down or rash.        Objective:   Physical Exam Patient alert and oriented and in no cardiopulmonary distress.Ill appearing and hoarse  HEENT: No facial asymmetry, EOMI, maxillary  sinus tenderness,  oropharynx pink and moist.  Neck supple no adenopathy.Nasal mucosa erythematous and edematous  Chest: Clear to auscultation bilaterally.  CVS: S1, S2 no murmurs, no S3.  ABD: Soft non tender. Bowel sounds normal.  Ext: No edema  MS: Adequate ROM spine, shoulders, hips and knees.  Skin: Intact, no ulcerations or rash noted.  Psych: Good eye contact, normal affect. Memory intact not anxious or depressed appearing.  CNS: CN 2-12 intact, power, tone and sensation normal throughout.        Assessment & Plan:

## 2012-06-10 NOTE — Assessment & Plan Note (Addendum)
Depo medrol 80mg  Im in office today, followed by prednisone dose pack Voice rest and work excuse also

## 2012-06-17 NOTE — Assessment & Plan Note (Signed)
Please advise the patient that their blood sugars are higher than normal.   A normal HbA1C is less than 5.7, please give them their value.  They need to cut back on carb's and sweets, start regular exercise, target at least 30 minutes 5 days a weekly and aim for weight loss.  This will reduce the risk of becoming diabetic or delayed development. Updated lab neded

## 2012-06-17 NOTE — Assessment & Plan Note (Signed)
Improved ,  No med change

## 2012-06-17 NOTE — Assessment & Plan Note (Signed)
Sub optimal control, no med chane DASH diet and commitment to daily physical activity for a minimum of 30 minutes discussed and encouraged, as a part of hypertension management. The importance of attaining a healthy weight is also discussed.  

## 2012-06-17 NOTE — Assessment & Plan Note (Signed)
Acute infection, z pack prescribed

## 2012-06-17 NOTE — Assessment & Plan Note (Signed)
Deteriorated. Patient re-educated about  the importance of commitment to a  minimum of 150 minutes of exercise per week. The importance of healthy food choices with portion control discussed. Encouraged to start a food diary, count calories and to consider  joining a support group. Sample diet sheets offered. Goals set by the patient for the next several months.    

## 2012-06-17 NOTE — Assessment & Plan Note (Signed)
Controlled, no change in medication  

## 2012-08-16 ENCOUNTER — Encounter: Payer: Self-pay | Admitting: Family Medicine

## 2012-08-16 ENCOUNTER — Telehealth: Payer: Self-pay | Admitting: Family Medicine

## 2012-08-16 ENCOUNTER — Ambulatory Visit (INDEPENDENT_AMBULATORY_CARE_PROVIDER_SITE_OTHER): Payer: BC Managed Care – PPO | Admitting: Family Medicine

## 2012-08-16 VITALS — BP 126/74 | HR 90 | Resp 18 | Ht 62.0 in | Wt 233.0 lb

## 2012-08-16 DIAGNOSIS — H9201 Otalgia, right ear: Secondary | ICD-10-CM

## 2012-08-16 DIAGNOSIS — I10 Essential (primary) hypertension: Secondary | ICD-10-CM

## 2012-08-16 DIAGNOSIS — R7302 Impaired glucose tolerance (oral): Secondary | ICD-10-CM

## 2012-08-16 DIAGNOSIS — F411 Generalized anxiety disorder: Secondary | ICD-10-CM

## 2012-08-16 DIAGNOSIS — J019 Acute sinusitis, unspecified: Secondary | ICD-10-CM

## 2012-08-16 DIAGNOSIS — J0100 Acute maxillary sinusitis, unspecified: Secondary | ICD-10-CM

## 2012-08-16 DIAGNOSIS — E785 Hyperlipidemia, unspecified: Secondary | ICD-10-CM

## 2012-08-16 DIAGNOSIS — H9209 Otalgia, unspecified ear: Secondary | ICD-10-CM

## 2012-08-16 DIAGNOSIS — K219 Gastro-esophageal reflux disease without esophagitis: Secondary | ICD-10-CM

## 2012-08-16 DIAGNOSIS — R7303 Prediabetes: Secondary | ICD-10-CM

## 2012-08-16 DIAGNOSIS — R7309 Other abnormal glucose: Secondary | ICD-10-CM

## 2012-08-16 MED ORDER — FLUOXETINE HCL 20 MG PO CAPS: 20.0000 mg | ORAL_CAPSULE | Freq: Every day | ORAL | Status: DC

## 2012-08-16 MED ORDER — METFORMIN HCL ER 500 MG PO TB24: 500.0000 mg | ORAL_TABLET | Freq: Every day | ORAL | Status: DC

## 2012-08-16 MED ORDER — DOXYCYCLINE HYCLATE 100 MG PO TABS: 100.0000 mg | ORAL_TABLET | Freq: Two times a day (BID) | ORAL | Status: AC

## 2012-08-16 NOTE — Telephone Encounter (Signed)
Patient states that she is having sinus pain, r ear pain, sinus drainage.  Her sputum is yellow/green in color.

## 2012-08-16 NOTE — Telephone Encounter (Signed)
Lat here 2 months ago, -pls document her symptoms will need re eval based on long time between visits. From documentation I may refer her to a specialist, just want to hear what's going on

## 2012-08-16 NOTE — Telephone Encounter (Signed)
Advised workin today at [employer]

## 2012-08-16 NOTE — Patient Instructions (Addendum)
F/u in 3 month, cancel sooner New for blood sugar is metformin 1 daily, sent to mail order.  Increased dose of fluoxetine is being sent to mail order.  Local antibiotic script for current sinus infection for 10 days.  Referral to Dr Suszanne Conners to eval chronic right sinus pressure , ear pain and drainage  Fasting lipid, cmp and EGFr and hBa1C  And TSH in 3 month

## 2012-08-19 ENCOUNTER — Ambulatory Visit (INDEPENDENT_AMBULATORY_CARE_PROVIDER_SITE_OTHER): Payer: BC Managed Care – PPO | Admitting: Otolaryngology

## 2012-08-19 DIAGNOSIS — J342 Deviated nasal septum: Secondary | ICD-10-CM

## 2012-08-19 DIAGNOSIS — J310 Chronic rhinitis: Secondary | ICD-10-CM

## 2012-08-22 DIAGNOSIS — R7302 Impaired glucose tolerance (oral): Secondary | ICD-10-CM | POA: Insufficient documentation

## 2012-08-22 NOTE — Assessment & Plan Note (Signed)
Antibiotic course prescribed, and pt referred to ENT for chronic complaints of sinus pressure, ear pain and sore throat

## 2012-08-22 NOTE — Assessment & Plan Note (Signed)
Sub optimal control, increase dose of fluoxetine

## 2012-08-22 NOTE — Assessment & Plan Note (Signed)
Controlled, no change in medication DASH diet and commitment to daily physical activity for a minimum of 30 minutes discussed and encouraged, as a part of hypertension management. The importance of attaining a healthy weight is also discussed.  

## 2012-08-22 NOTE — Assessment & Plan Note (Signed)
Controlled, no change in medication  

## 2012-08-22 NOTE — Assessment & Plan Note (Signed)
Deteriorated, pt to start metformin Patient educated about the importance of limiting  Carbohydrate intake , the need to commit to daily physical activity for a minimum of 30 minutes , and to commit weight loss. The fact that changes in all these areas will reduce or eliminate all together the development of diabetes is stressed.

## 2012-08-22 NOTE — Assessment & Plan Note (Signed)
Deteriorated. Patient re-educated about  the importance of commitment to a  minimum of 150 minutes of exercise per week. The importance of healthy food choices with portion control discussed. Encouraged to start a food diary, count calories and to consider  joining a support group. Sample diet sheets offered. Goals set by the patient for the next several months.    

## 2012-08-22 NOTE — Assessment & Plan Note (Signed)
Chronic complaint with normal exam , refer to ENT

## 2012-08-22 NOTE — Assessment & Plan Note (Signed)
Hyperlipidemia:Low fat diet discussed and encouraged.  Updated lab next visit 

## 2012-08-22 NOTE — Progress Notes (Signed)
  Subjective:    Patient ID: Caitlin Sampson, female    DOB: 1961/03/01, 52 y.o.   MRN: [id number]  HPI 3 day h/o increased facial pressure with yellow green nasal drainage and chills. Denies sore throat , but reports chronic right ear pain andright facial pressure, was treate forsimilar complaint in January No weight loss, frustrating to pt who has adopte healthier eating pattern,a nd also attempting to exercise weather permitting January labs again reviewedm she is now willing to take metfomin Also c/increased anxiety, will increase prozac dose   Review of Systems See HPI Denies recent fever has had  chills.  Denies chest congestion, productive cough or wheezing. Denies chest pains, palpitations and leg swelling Denies abdominal pain, nausea, vomiting,diarrhea or constipation.   Denies dysuria, frequency, hesitancy or incontinence. Denies joint pain, swelling and limitation in mobility. Denies headaches, seizures, numbness, or tingling. Denies depression,  Denies skin break down or rash.        Objective:   Physical Exam  Patient alert and oriented and in no cardiopulmonary distress.  HEENT: No facial asymmetry, EOMI, right maxillary  sinus tenderness,  oropharynx pink and moist.  Neck supple no adenopathy.TM clear bilaterally  Chest: Clear to auscultation bilaterally.  CVS: S1, S2 no murmurs, no S3.  ABD: Soft non tender. Bowel sounds normal.  Ext: No edema  MS: Adequate ROM spine, shoulders, hips and knees.  Skin: Intact, no ulcerations or rash noted.  Psych: Good eye contact, normal affect. Memory intact mildly  anxious not  depressed appearing.  CNS: CN 2-12 intact, power, tone and sensation normal throughout.       Assessment & Plan:

## 2012-09-13 ENCOUNTER — Encounter: Payer: Self-pay | Admitting: Cardiovascular Disease

## 2012-10-27 ENCOUNTER — Telehealth: Payer: Self-pay | Admitting: Family Medicine

## 2012-10-27 NOTE — Telephone Encounter (Signed)
Patient will come in @ 8

## 2012-10-27 NOTE — Telephone Encounter (Signed)
Needs appt work in am

## 2012-10-27 NOTE — Telephone Encounter (Signed)
Has been hurting some inside her lower abdomen and back. Feels sore and bloated. No diarrhea. Wants to know if it could be the diverticulosis again or if she needs an appt.   Would have to be worked in

## 2012-10-28 ENCOUNTER — Ambulatory Visit (HOSPITAL_COMMUNITY)
Admission: RE | Admit: 2012-10-28 | Discharge: 2012-10-28 | Disposition: A | Discharge status: Home / Self Care | Payer: BC Managed Care – PPO | Source: Ambulatory Visit | Attending: Family Medicine | Admitting: Family Medicine

## 2012-10-28 ENCOUNTER — Encounter: Payer: Self-pay | Admitting: Family Medicine

## 2012-10-28 ENCOUNTER — Ambulatory Visit (INDEPENDENT_AMBULATORY_CARE_PROVIDER_SITE_OTHER): Payer: BC Managed Care – PPO | Admitting: Family Medicine

## 2012-10-28 VITALS — BP 112/80 | HR 88 | Resp 16 | Ht 62.0 in | Wt 230.8 lb

## 2012-10-28 DIAGNOSIS — R5383 Other fatigue: Secondary | ICD-10-CM

## 2012-10-28 DIAGNOSIS — R109 Unspecified abdominal pain: Secondary | ICD-10-CM | POA: Insufficient documentation

## 2012-10-28 DIAGNOSIS — E785 Hyperlipidemia, unspecified: Secondary | ICD-10-CM

## 2012-10-28 DIAGNOSIS — K5792 Diverticulitis of intestine, part unspecified, without perforation or abscess without bleeding: Secondary | ICD-10-CM

## 2012-10-28 DIAGNOSIS — R7309 Other abnormal glucose: Secondary | ICD-10-CM

## 2012-10-28 DIAGNOSIS — N390 Urinary tract infection, site not specified: Secondary | ICD-10-CM

## 2012-10-28 DIAGNOSIS — K5732 Diverticulitis of large intestine without perforation or abscess without bleeding: Secondary | ICD-10-CM

## 2012-10-28 DIAGNOSIS — R7303 Prediabetes: Secondary | ICD-10-CM

## 2012-10-28 DIAGNOSIS — I10 Essential (primary) hypertension: Secondary | ICD-10-CM

## 2012-10-28 LAB — POCT URINALYSIS DIPSTICK
Bilirubin, UA: NEGATIVE
Ketones, UA: NEGATIVE
Leukocytes, UA: NEGATIVE
Spec Grav, UA: >=1.03

## 2012-10-28 LAB — BASIC METABOLIC PANEL
Calcium: 9.6 mg/dL (ref 8.4–10.5)
Chloride: 97 mEq/L (ref 96–112)
Creat: 0.61 mg/dL (ref 0.50–1.10)

## 2012-10-28 LAB — CBC WITH DIFFERENTIAL/PLATELET
Basophils Absolute: 0 10*3/uL (ref 0.0–0.1)
Eosinophils Absolute: 0.1 10*3/uL (ref 0.0–0.7)
Eosinophils Relative: 1 % (ref 0–5)
Lymphocytes Relative: 24 % (ref 12–46)
MCV: 87.4 fL (ref 78.0–100.0)
Neutrophils Relative %: 63 % (ref 43–77)
Platelets: 351 10*3/uL (ref 150–400)
RDW: 13.6 % (ref 11.5–15.5)
WBC: 8.6 10*3/uL (ref 4.0–10.5)

## 2012-10-28 LAB — HEPATIC FUNCTION PANEL
Bilirubin, Direct: 0.1 mg/dL (ref 0.0–0.3)
Indirect Bilirubin: 0.3 mg/dL (ref 0.0–0.9)

## 2012-10-28 LAB — LIPID PANEL
HDL: 77 mg/dL (ref >39)
LDL Cholesterol: 132 mg/dL — ABNORMAL HIGH (ref 0–99)
Triglycerides: 104 mg/dL (ref <150)
VLDL: 21 mg/dL (ref 0–40)

## 2012-10-28 MED ORDER — HYDROCODONE-ACETAMINOPHEN 5-325 MG PO TABS: ORAL_TABLET | ORAL | Status: DC

## 2012-10-28 MED ORDER — CIPROFLOXACIN HCL 500 MG PO TABS: 500.0000 mg | ORAL_TABLET | Freq: Two times a day (BID) | ORAL | Status: AC

## 2012-10-28 MED ORDER — METRONIDAZOLE 500 MG PO TABS: 500.0000 mg | ORAL_TABLET | Freq: Two times a day (BID) | ORAL | Status: AC

## 2012-10-28 NOTE — Patient Instructions (Addendum)
F/u as before, pls call if you need me beforeDiverticulitis Small pockets or "bubbles" can develop in the wall of the intestine. Diverticulitis is when those pockets become infected and inflamed. This causes stomach pain (usually on the left side). HOME CARE  Take all medicine as told by your doctor.  Try a clear liquid diet (broth, tea, or water) for as long as told by your doctor.  Keep all follow-up visits with your doctor.  You may be put on a low-fiber diet once you start feeling better. Here are foods that have low-fiber:  White breads, cereals, rice, and pasta.  Cooked fruits and vegetables or soft fresh fruits and vegetables without the skin.  Ground or well-cooked tender beef, ham, veal, lamb, pork, or poultry.  Eggs and seafood.  After you are doing well on the low-fiber diet, you may be put on a high-fiber diet. Here are ways to increase your fiber:  Choose whole-grain breads, cereals, pasta, and brown rice.  Choose fruits and vegetables with skin on. Do not overcook the vegetables.  Choose nuts, seeds, legumes, dried peas, beans, and lentils.  Look for food products that have more than 3 grams of fiber per serving on the food label. GET HELP RIGHT AWAY IF:  Your pain does not get better or gets worse.  You have trouble eating food.  You are not pooping (having bowel movements) like normal.  You have a temperature by mouth above 102 F (38.9 C), not controlled by medicine.  You keep throwing up (vomiting).  You have bloody or black, tarry poop (stools).  You are getting worse and not better. MAKE SURE YOU:   Understand these instructions.  Will watch your condition.  Will get help right away if you are not doing well or get worse. Document Released: 10/22/2007 Document Revised: 07/28/2011 Document Reviewed: 03/26/2009 [facility] Patient Information 2014 [place], [place].  You are being treated for acute diverticulitis.  Ciprofloxacin and flagyll are  prescribed for 10 days. Limit intake to clear liquids for the next 1 io 2 days, keep fluid intake up, and advance to solid foods slowly as able  You may use sparingly, pain med prescribed if needed Xray and labs today, you will be contacted with reslts if bnormal  Please call or go to ED if you develop rectal bleeding, fever, chills, or increased abdominal pain and are uunable to keep down food  labs today: CBC and dioff stat, chem 7 stat Other labs today are lipids, hepatic panel HBA1C and TSH

## 2012-10-28 NOTE — ED Provider Notes (Signed)
Order(s) created erroneously. Erroneous order ID: [id number] Order moved by: GRAVES, Shakenya Stoneberg M Order move date/time: 10/28/2012  9:04 AM Source Patient:    [id number] Source Contact: 10/28/2012 Destination Patient:    [id number] Destination Contact: 10/28/2012

## 2012-10-28 NOTE — Progress Notes (Signed)
  Subjective:    Patient ID: Caitlin Sampson, female    DOB: 1960-12-19, 52 y.o.   MRN: [id number]  HPI The PT is here for f with a 5 day h/o generalized abdominal pain , mainly in lower abdomen, similar to diverticulitis pain. Bloated, no bloody stool, intermittent nausea, no vomiting or diarreah, has noted urinary frequency, with some relief in abdominal pain after voiding  Possible low grade fever no chills or flank pain  Has used hydrocodone for pain relief  Preventive health is updated, specifically  Cancer screening and Immunization.       Review of Systems See HPI Denies recent fever or chills. Denies sinus pressure, nasal congestion, ear pain or sore throat. Denies chest congestion, productive cough or wheezing. Denies chest pains, palpitations and leg swelling C/o mild dysuria and urinary frequency x 3 days, no flank pain or fever   Denies dysuria, frequency, hesitancy or incontinence. Denies joint pain, swelling and limitation in mobility. Denies headaches, seizures, numbness, or tingling. Denies depression, anxiety or insomnia. Denies skin break down or rash.        Objective:   Physical Exam  Patient alert and oriented and in no cardiopulmonary distress.  HEENT: No facial asymmetry, EOMI, no sinus tenderness,  oropharynx pink and moist.  Neck supple no adenopathy.  Chest: Clear to auscultation bilaterally.  CVS: S1, S2 no murmurs, no S3.  ABD: Soft lower abdominal tenderness, most marked in supra pubic and left lower quadrant area. N guarding or rebound normal bowel sounds Rectal pt refused  Ext: No edema  MS: Adequate ROM spine, shoulders, hips and knees.  Skin: Intact, no ulcerations or rash noted.  Psych: Good eye contact, normal affect. Memory intact mildly  anxious or depressed appearing.  CNS: CN 2-12 intact, power, tone and sensation normal throughout.       Assessment & Plan:

## 2012-10-31 ENCOUNTER — Other Ambulatory Visit: Payer: Self-pay | Admitting: Family Medicine

## 2012-11-01 ENCOUNTER — Other Ambulatory Visit: Payer: Self-pay

## 2012-11-01 MED ORDER — AMOXICILLIN-POT CLAVULANATE 875-125 MG PO TABS: 1.0000 | ORAL_TABLET | Freq: Two times a day (BID) | ORAL | Status: DC

## 2012-11-07 DIAGNOSIS — N390 Urinary tract infection, site not specified: Secondary | ICD-10-CM | POA: Insufficient documentation

## 2012-11-07 NOTE — Assessment & Plan Note (Signed)
Controlled, no change in medication  

## 2012-11-07 NOTE — Assessment & Plan Note (Signed)
abn ua and symptomatic will lw ait on culture o specifically treat, pt encouraged to push fluids

## 2012-11-07 NOTE — Assessment & Plan Note (Signed)
Deterirorated, pt need sto follow low fat diet and comply with meds, will discuss further at f/u

## 2012-11-07 NOTE — Assessment & Plan Note (Signed)
Symptoms and signs most suggestive for acute flare, stata abdominal xray and blood work inconsitent with severe disease. Will cover with double antibiotics, pending UA results

## 2012-11-10 ENCOUNTER — Other Ambulatory Visit: Payer: Self-pay | Admitting: Family Medicine

## 2012-11-10 DIAGNOSIS — Z139 Encounter for screening, unspecified: Secondary | ICD-10-CM

## 2012-11-11 ENCOUNTER — Ambulatory Visit (HOSPITAL_COMMUNITY)
Admission: RE | Admit: 2012-11-11 | Discharge: 2012-11-11 | Disposition: A | Discharge status: Home / Self Care | Payer: BC Managed Care – PPO | Source: Ambulatory Visit | Attending: Family Medicine | Admitting: Family Medicine

## 2012-11-11 DIAGNOSIS — Z1231 Encounter for screening mammogram for malignant neoplasm of breast: Secondary | ICD-10-CM | POA: Insufficient documentation

## 2012-11-11 DIAGNOSIS — Z139 Encounter for screening, unspecified: Secondary | ICD-10-CM

## 2012-11-16 ENCOUNTER — Ambulatory Visit: Payer: BC Managed Care – PPO | Admitting: Family Medicine

## 2012-11-24 ENCOUNTER — Encounter: Payer: Self-pay | Admitting: Family Medicine

## 2012-11-24 ENCOUNTER — Ambulatory Visit (INDEPENDENT_AMBULATORY_CARE_PROVIDER_SITE_OTHER): Payer: BC Managed Care – PPO | Admitting: Family Medicine

## 2012-11-24 VITALS — BP 130/82 | HR 81 | Resp 16 | Ht 62.0 in | Wt 230.0 lb

## 2012-11-24 DIAGNOSIS — E8881 Metabolic syndrome: Secondary | ICD-10-CM

## 2012-11-24 DIAGNOSIS — R7309 Other abnormal glucose: Secondary | ICD-10-CM

## 2012-11-24 DIAGNOSIS — Z23 Encounter for immunization: Secondary | ICD-10-CM

## 2012-11-24 DIAGNOSIS — R7303 Prediabetes: Secondary | ICD-10-CM

## 2012-11-24 DIAGNOSIS — I10 Essential (primary) hypertension: Secondary | ICD-10-CM

## 2012-11-24 DIAGNOSIS — E785 Hyperlipidemia, unspecified: Secondary | ICD-10-CM

## 2012-11-24 DIAGNOSIS — Z1211 Encounter for screening for malignant neoplasm of colon: Secondary | ICD-10-CM

## 2012-11-24 MED ORDER — PHENTERMINE HCL 37.5 MG PO TABS: 37.5000 mg | ORAL_TABLET | Freq: Every day | ORAL | Status: DC

## 2012-11-24 NOTE — Patient Instructions (Addendum)
F/u in 4 month  Blood sugar is much better, continue metformin as before, one daily  Please work on eating mainly vegetables and fruit without additives, reduce red meat, cheese and egg yolks.  64 ounces water daily is healthy  Physical activity for at least 30 minutes TOTAL daily, 3 ten minute sessions is as good as one 30 minute session  Try to eat over a 12 hour period at the same time every day, eg. 7am , 12:30 and 7 pm, allowable snacks in between are fresh fruit and vegetables, uncooked, no additives  Start Half phentermine every morning before breakfast. If you ex[perience any bad side effects STOP the drug and let me knwo.  To make it "worth" taking the medication, and to achieve improved health, the weight loss goal is 4 pounds per month  Fasting lipid and HBA1C and chem 7 in 4 month  TdAP today

## 2012-11-28 DIAGNOSIS — E8881 Metabolic syndrome: Secondary | ICD-10-CM | POA: Insufficient documentation

## 2012-11-28 NOTE — Assessment & Plan Note (Signed)
Controlled, no change in medication DASH diet and commitment to daily physical activity for a minimum of 30 minutes discussed and encouraged, as a part of hypertension management. The importance of attaining a healthy weight is also discussed.  

## 2012-11-28 NOTE — Assessment & Plan Note (Signed)
Improved HBA1C with metformin, pt will continue this and will add phentermine in the help that this will assist with weight loss

## 2012-11-28 NOTE — Assessment & Plan Note (Signed)
Uncontrolled Hyperlipidemia:Low fat diet discussed and encouraged.  Needs to get help from medication

## 2012-11-28 NOTE — Assessment & Plan Note (Signed)
Pt aware of increased CV risk and will work on llife style change to address this

## 2012-11-28 NOTE — Assessment & Plan Note (Signed)
Unchanged. Patient re-educated about  the importance of commitment to a  minimum of 150 minutes of exercise per week. The importance of healthy food choices with portion control discussed. Encouraged to start a food diary, count calories and to consider  joining a support group. Sample diet sheets offered. Goals set by the patient for the next several months.    

## 2012-11-28 NOTE — Progress Notes (Signed)
  Subjective:    Patient ID: Caitlin Sampson, female    DOB: 1961-01-16, 52 y.o.   MRN: [id number]  HPI The PT is here for follow up and re-evaluation of chronic medical conditions, medication management and review of any available recent lab and radiology data.  Preventive health is updated, specifically  Cancer screening and Immunization.   Questions or concerns regarding consultations or procedures which the PT has had in the interim are  addressed. The PT denies any adverse reactions to current medications since the last visit.  There are no new concerns.  There are no specific complaints       Review of Systems See HPI Denies recent fever or chills. Denies sinus pressure, nasal congestion, ear pain or sore throat. Denies chest congestion, productive cough or wheezing. Denies chest pains, palpitations and leg swelling Denies abdominal pain, nausea, vomiting,diarrhea or constipation.   Denies dysuria, frequency, hesitancy or incontinence. Denies joint pain, swelling and limitation in mobility. Denies headaches, seizures, numbness, or tingling. Denies depression, anxiety or insomnia. Denies skin break down or rash.         Objective:   Physical Exam  Patient alert and oriented and in no cardiopulmonary distress.  HEENT: No facial asymmetry, EOMI, no sinus tenderness,  oropharynx pink and moist.  Neck supple no adenopathy.  Chest: Clear to auscultation bilaterally.  CVS: S1, S2 no murmurs, no S3.  ABD: Soft non tender. Bowel sounds normal.  Ext: No edema  MS: Adequate ROM spine, shoulders, hips and knees.  Skin: Intact, no ulcerations or rash noted.  Psych: Good eye contact, normal affect. Memory intact not anxious or depressed appearing.  CNS: CN 2-12 intact, power, tone and sensation normal throughout.       Assessment & Plan:

## 2013-02-07 ENCOUNTER — Other Ambulatory Visit: Payer: Self-pay | Admitting: Family Medicine

## 2013-03-24 ENCOUNTER — Other Ambulatory Visit: Payer: Self-pay

## 2013-03-30 ENCOUNTER — Ambulatory Visit: Payer: BC Managed Care – PPO | Admitting: Family Medicine

## 2013-05-25 ENCOUNTER — Telehealth: Payer: Self-pay | Admitting: *Deleted

## 2013-05-31 ENCOUNTER — Ambulatory Visit: Payer: BC Managed Care – PPO | Admitting: Cardiovascular Disease

## 2013-06-15 ENCOUNTER — Other Ambulatory Visit: Payer: Self-pay | Admitting: Cardiovascular Disease

## 2013-06-15 ENCOUNTER — Telehealth: Payer: Self-pay | Admitting: Cardiovascular Disease

## 2013-06-15 LAB — COMPREHENSIVE METABOLIC PANEL
ALK PHOS: 63 U/L (ref 39–117)
ALT: 25 U/L (ref 0–35)
AST: 20 U/L (ref 0–37)
Albumin: 4.2 g/dL (ref 3.5–5.2)
BILIRUBIN TOTAL: 0.4 mg/dL (ref 0.2–1.2)
BUN: 14 mg/dL (ref 6–23)
CO2: 30 mEq/L (ref 19–32)
CREATININE: 0.64 mg/dL (ref 0.50–1.10)
Calcium: 9.7 mg/dL (ref 8.4–10.5)
Chloride: 100 mEq/L (ref 96–112)
Glucose, Bld: 107 mg/dL — ABNORMAL HIGH (ref 70–99)
Potassium: 3.7 mEq/L (ref 3.5–5.3)
SODIUM: 138 meq/L (ref 135–145)
Total Protein: 6.5 g/dL (ref 6.0–8.3)

## 2013-06-15 LAB — NMR LIPOPROFILE WITH LIPIDS
CHOLESTEROL, TOTAL: 236 mg/dL — AB (ref <200)
HDL PARTICLE NUMBER: 45.3 umol/L (ref >=30.5)
HDL Size: 9.1 nm — ABNORMAL LOW (ref >=9.2)
HDL-C: 64 mg/dL (ref >=40)
LARGE HDL: 8.3 umol/L (ref >=4.8)
LARGE VLDL-P: 5.4 nmol/L — AB (ref <=2.7)
LDL (calc): 146 mg/dL — ABNORMAL HIGH (ref <100)
LDL PARTICLE NUMBER: 1995 nmol/L — AB (ref <1000)
LDL Size: 21.3 nm (ref >20.5)
LP-IR Score: 53 — ABNORMAL HIGH (ref <=45)
Small LDL Particle Number: 705 nmol/L — ABNORMAL HIGH (ref <=527)
TRIGLYCERIDES: 132 mg/dL (ref <150)
VLDL SIZE: 57.2 nm — AB (ref <=46.6)

## 2013-06-16 NOTE — Telephone Encounter (Signed)
LATE ENTRY [facility] WANTED THE OKAY TO CHANGE ACCT # FROM THE [facility] [place] OFFICE TO THE [facility] #. VERBAL AUTHORIZATION TO CHANGE ACCT #

## 2013-06-22 ENCOUNTER — Encounter: Payer: Self-pay | Admitting: Cardiovascular Disease

## 2013-06-22 ENCOUNTER — Ambulatory Visit (INDEPENDENT_AMBULATORY_CARE_PROVIDER_SITE_OTHER): Payer: BC Managed Care – PPO | Admitting: Cardiovascular Disease

## 2013-06-22 VITALS — BP 142/92 | HR 80 | Ht 62.0 in | Wt 233.5 lb

## 2013-06-22 DIAGNOSIS — E119 Type 2 diabetes mellitus without complications: Secondary | ICD-10-CM

## 2013-06-22 DIAGNOSIS — R7303 Prediabetes: Secondary | ICD-10-CM

## 2013-06-22 DIAGNOSIS — R5381 Other malaise: Secondary | ICD-10-CM

## 2013-06-22 DIAGNOSIS — K219 Gastro-esophageal reflux disease without esophagitis: Secondary | ICD-10-CM

## 2013-06-22 DIAGNOSIS — R7309 Other abnormal glucose: Secondary | ICD-10-CM

## 2013-06-22 DIAGNOSIS — E785 Hyperlipidemia, unspecified: Secondary | ICD-10-CM

## 2013-06-22 DIAGNOSIS — F411 Generalized anxiety disorder: Secondary | ICD-10-CM

## 2013-06-22 DIAGNOSIS — I10 Essential (primary) hypertension: Secondary | ICD-10-CM

## 2013-06-22 DIAGNOSIS — R5383 Other fatigue: Secondary | ICD-10-CM

## 2013-06-22 MED ORDER — ROSUVASTATIN CALCIUM 10 MG PO TABS: ORAL_TABLET | ORAL | Status: DC

## 2013-06-22 NOTE — Patient Instructions (Signed)
Your physician has recommended you make the following change in your medication: increase the CoQ10 to 300 mg. Stop the red yeast rice.  start the Crestor 10 mg every other day.  Your physician recommends that you return for lab work in: 3 months.  Your physician recommends that you schedule a follow-up appointment in: 6 months with Dr. Claiborne Billings.

## 2013-06-22 NOTE — Progress Notes (Signed)
Patient ID: Caitlin Sampson, female   DOB: 07-20-1960, 53 y.o.   MRN: [id number]     HPI: Caitlin Sampson is a 53 y.o. female as to the office today for ten-month cardiology evaluation.  Caitlin Sampson has a history of hypertension, hyperlipidemia, obesity, and metabolic syndrome/borderline diabetes. There is a strong family history for coronary artery disease with a father suffering an initial myocardial infarction at age 15. 101 years ago, in 2005 a cardiac catheterization was performed for chest pain. She was found to have mid LAD intramyocardial segment but did not have obstructive disease. Last fall, she developed episodes of chest tightness with mild shortness of breath. A nuclear perfusion study in December 2003 revealed normal perfusion and function. Should normal wall motion.  She also has a history of GERD. She's not been very successful with weight loss. Progress she has been on valsartan 320/25 but has been cutting this in half, and also is on Glucophage XRT 500 mg. In the past she had taken Lipitor but stopped this secondary to development of some mild myalgias.  She recently had followup laboratory and she has been taking only bread yeast rice. Her LDL particle number was now significantly increased at 1995 particles with an LDL calculated at 146. Total cholesterol 236. Small LDL particle number was increased at 705. Insulin resistance who was increased at 53. Had increased VLDL size at 50  Caitlin Sampson denies recent chest pain. She does have GERD for which he takes Nexium. She denies PND orthopnea. She denies palpitations. She states she exercises 3 days per week.  Past Medical History  Diagnosis Date  . Diverticulosis of colon 2012  . Hypertension   . Hyperlipidemia   . Anxiety   . Poison oak dermatitis 2012    Past Surgical History  Procedure Laterality Date  . Cholecystectomy    . Abdominal hysterectomy      Allergies  Allergen Reactions  . Vytorin [Ezetimibe-Simvastatin] Other  (See Comments)    Joint pain  . Zetia [Ezetimibe] Other (See Comments)    Joint pain    Current Outpatient Prescriptions  Medication Sig Dispense Refill  . ALPRAZolam (XANAX) 0.25 MG tablet Take 0.25 mg by mouth 2 (two) times daily as needed.        . Ascorbic Acid (VITAMIN C) 1000 MG tablet Take 1,000 mg by mouth daily.      Marland Kitchen aspirin 81 MG tablet Take 81 mg by mouth daily.        . B Complex Vitamins (VITAMIN B COMPLEX PO) Take 1 tablet by mouth daily.      . Coenzyme Q10 300 MG CAPS Take 1 capsule by mouth daily.      Marland Kitchen esomeprazole (NEXIUM) 40 MG capsule Take 1 capsule (40 mg total) by mouth daily before breakfast.  90 capsule  1  . FLUoxetine (PROZAC) 20 MG capsule Take 1 capsule (20 mg total) by mouth daily.  90 capsule  3  . loratadine (CLARITIN) 10 MG tablet Take 10 mg by mouth daily.      . metFORMIN (GLUCOPHAGE XR) 500 MG 24 hr tablet Take 1 tablet (500 mg total) by mouth daily with breakfast.  90 tablet  3  . potassium chloride (K-DUR,KLOR-CON) 10 MEQ tablet TAKE 1 TABLET (10 MEQ TOTAL)  BY MOUTH  DAILY.      . valsartan-hydrochlorothiazide (DIOVAN-HCT) 320-25 MG per tablet Take 0.5 tablets by mouth daily.  45 tablet  1  . rosuvastatin (CRESTOR) 10 MG tablet  Take 1 tablet every other day  14 tablet  0  . [DISCONTINUED] potassium chloride (K-DUR) 10 MEQ tablet Take 1 tablet (10 mEq total) by mouth 2 (two) times daily.  60 tablet  5   No current facility-administered medications for this visit.    History   Social History  . Marital Status: Married    Spouse Name: N/A    Number of Children: N/A  . Years of Education: N/A   Occupational History  . Not on file.   Social History Main Topics  . Smoking status: Former Research scientist (life sciences)  . Smokeless tobacco: Not on file  . Alcohol Use: Not on file  . Drug Use: Not on file  . Sexual Activity: Not on file   Other Topics Concern  . Not on file   Social History Narrative  . No narrative on file   Socially she is married and has  one child. She quit smoking greater than 17 years ago. She does exercise.  Family History  Problem Relation Age of Onset  . Heart disease Mother     a fib  . Kidney disease Mother   . Heart disease Father     MI  . Hyperlipidemia Father   . Hypertension Father   . Kidney disease Father   . Kidney disease Brother     ROS is negative for fevers, chills or night sweats.   He denies change in vision or hearing. She denies lymphadenopathy. She denies wheezing. There is no PND or orthopnea. She is unaware of tach and palpitations. There is no presyncope. She denies chest pressure. She denies change in bowel or bladder habits. She denies nausea vomiting. There is no blood in stool or urine. Her myalgias improve with discontinuance of her Lipitor. She denies tremor. She does have history of anxiety for which she takes Prozac. She also has allergies for which he takes Claritin on an as-needed basis. Other comprehensive 14 point system review is negative.  PE BP 142/92  Pulse 80  Ht 5\' 2"  (1.575 m)  Wt 233 lb 8 oz (105.915 kg)  BMI 42.70 kg/m2  Repeat blood pressure by me 124/80 General: Alert, oriented, no distress.  Skin: normal turgor, no rashes HEENT: Normocephalic, atraumatic. Pupils round and reactive; sclera anicteric;no lid lag. Extraocular muscles intact. No xanthelasma Nose without nasal septal hypertrophy Mouth/Parynx benign; Mallinpatti scale 3 Neck: No JVD, no carotid bruits; normal carotid upstroke Lungs: clear to ausculatation and percussion; no wheezing or rales Chest wall: no tenderness to palpitation Heart: RRR, s1 s2 normal 1/6 systolic murmur. No S3 gallop. No S4. No thrills or heaves. Abdomen: soft, nontender; no hepatosplenomehaly, BS+; abdominal aorta nontender and not dilated by palpation. Back: no CVA tenderness Pulses 2+ Extremities: no clubbing cyanosis or edema, Homan's sign negative  Neurologic: grossly nonfocal; cranial nerves grossly normal. Psychologic:  normal affect and mood.  ECG (independently read by me): Sinus rhythm at 80 beats per minute. No significant ST-T changes., Nonspecific  LABS:  BMET    Component Value Date/Time   NA 138 06/15/2013 0849   K 3.7 06/15/2013 0849   CL 100 06/15/2013 0849   CO2 30 06/15/2013 0849   GLUCOSE 107* 06/15/2013 0849   BUN 14 06/15/2013 0849   CREATININE 0.64 06/15/2013 0849   CREATININE 0.58 05/03/2010 1936   CALCIUM 9.7 06/15/2013 0849     Hepatic Function Panel     Component Value Date/Time   PROT 6.5 06/15/2013 0849   ALBUMIN 4.2 06/15/2013  0849   AST 20 06/15/2013 0849   ALT 25 06/15/2013 0849   ALKPHOS 63 06/15/2013 0849   BILITOT 0.4 06/15/2013 0849   BILIDIR 0.1 10/28/2012 0840   IBILI 0.3 10/28/2012 0840     CBC    Component Value Date/Time   WBC 8.6 10/28/2012 0830   RBC 4.22 10/28/2012 0830   HGB 12.3 10/28/2012 0830   HCT 36.9 10/28/2012 0830   PLT 351 10/28/2012 0830   MCV 87.4 10/28/2012 0830   MCH 29.1 10/28/2012 0830   MCHC 33.3 10/28/2012 0830   RDW 13.6 10/28/2012 0830   LYMPHSABS 2.1 10/28/2012 0830   MONOABS 0.9 10/28/2012 0830   EOSABS 0.1 10/28/2012 0830   BASOSABS 0.0 10/28/2012 0830     BNP No results found for this basename: probnp    Lipid Panel     Component Value Date/Time   CHOL 230* 10/28/2012 0840   TRIG 132 06/15/2013 0849   TRIG 104 10/28/2012 0840   HDL 77 10/28/2012 0840   CHOLHDL 3.0 10/28/2012 0840   VLDL 21 10/28/2012 0840   LDLCALC 146* 06/15/2013 0849   LDLCALC 132* 10/28/2012 0840     RADIOLOGY: No results found.    ASSESSMENT AND PLAN: Caitlin Sampson is a 53 year old female who has morbid obesity with a body mass index of 42.7. She states her weight did increase to 241 pounds over the past month she has lost 7 pounds. Her blood pressure today was improved when rechecked by me 124/80 on her valsartan HCT. I did review her most recent laboratory. Her NMR profile is significantly abnormal off Lipitor. I'm recommending she rechallenge with a statin  and we'll Sampson her on Crestor initially at 10 mg every other day. If myalgias develop on this regimen she will then change this to every fourth day. I've recommended she take coenzyme Q10 300 mg daily. We did discuss significant weight loss and increasing exercise to 5 days per week. Her most recent chemistry profile was normal although her blood sugar was elevated at 107. Her GERD is controlled with Nexium. Repeat blood work will be obtained in 3 months. I will see her in 6 months for cardiology reevaluation.     Troy Sine, MD, [facility]  06/22/2013 5:21 PM

## 2013-06-30 ENCOUNTER — Other Ambulatory Visit: Payer: Self-pay | Admitting: *Deleted

## 2013-06-30 MED ORDER — EZETIMIBE 10 MG PO TABS: 10.0000 mg | ORAL_TABLET | Freq: Every day | ORAL | Status: DC

## 2013-07-11 ENCOUNTER — Telehealth: Payer: Self-pay | Admitting: Cardiovascular Disease

## 2013-07-11 ENCOUNTER — Other Ambulatory Visit: Payer: Self-pay | Admitting: *Deleted

## 2013-07-11 MED ORDER — VALSARTAN-HYDROCHLOROTHIAZIDE 160-12.5 MG PO TABS: 1.0000 | ORAL_TABLET | Freq: Every day | ORAL | Status: DC

## 2013-07-11 MED ORDER — POTASSIUM CHLORIDE CRYS ER 10 MEQ PO TBCR: EXTENDED_RELEASE_TABLET | ORAL | Status: DC

## 2013-07-11 MED ORDER — EZETIMIBE 10 MG PO TABS: 10.0000 mg | ORAL_TABLET | Freq: Every day | ORAL | Status: DC

## 2013-07-11 MED ORDER — ROSUVASTATIN CALCIUM 10 MG PO TABS: ORAL_TABLET | ORAL | Status: DC

## 2013-07-11 NOTE — Telephone Encounter (Signed)
Caitlin Sampson needed to know where to send her medicine. Please call it to. [phone]. Please do the 390 day supply.

## 2013-07-11 NOTE — Telephone Encounter (Signed)
Message forwarded to Wanda, CMA.  

## 2013-07-18 ENCOUNTER — Other Ambulatory Visit: Payer: Self-pay

## 2013-07-18 NOTE — Telephone Encounter (Signed)
Rx was sent to pharmacy electronically. 

## 2013-07-20 ENCOUNTER — Other Ambulatory Visit: Payer: Self-pay

## 2013-07-20 MED ORDER — ROSUVASTATIN CALCIUM 10 MG PO TABS: ORAL_TABLET | ORAL | Status: DC

## 2013-07-20 NOTE — Telephone Encounter (Signed)
Rx was sent to pharmacy electronically. 

## 2013-08-23 ENCOUNTER — Telehealth: Payer: Self-pay | Admitting: Family Medicine

## 2013-08-23 ENCOUNTER — Encounter: Payer: Self-pay | Admitting: Family Medicine

## 2013-08-23 ENCOUNTER — Ambulatory Visit (INDEPENDENT_AMBULATORY_CARE_PROVIDER_SITE_OTHER): Payer: BC Managed Care – PPO | Admitting: Family Medicine

## 2013-08-23 VITALS — BP 122/82 | HR 72 | Temp 98.7°F | Resp 18 | Ht 62.0 in | Wt 232.0 lb

## 2013-08-23 DIAGNOSIS — R7309 Other abnormal glucose: Secondary | ICD-10-CM

## 2013-08-23 DIAGNOSIS — I10 Essential (primary) hypertension: Secondary | ICD-10-CM

## 2013-08-23 DIAGNOSIS — J0100 Acute maxillary sinusitis, unspecified: Secondary | ICD-10-CM

## 2013-08-23 DIAGNOSIS — J329 Chronic sinusitis, unspecified: Secondary | ICD-10-CM

## 2013-08-23 DIAGNOSIS — E8881 Metabolic syndrome: Secondary | ICD-10-CM

## 2013-08-23 DIAGNOSIS — E785 Hyperlipidemia, unspecified: Secondary | ICD-10-CM

## 2013-08-23 DIAGNOSIS — J029 Acute pharyngitis, unspecified: Secondary | ICD-10-CM

## 2013-08-23 DIAGNOSIS — R7303 Prediabetes: Secondary | ICD-10-CM

## 2013-08-23 MED ORDER — FLUCONAZOLE 150 MG PO TABS: 150.0000 mg | ORAL_TABLET | Freq: Once | ORAL | Status: DC

## 2013-08-23 MED ORDER — CEFTRIAXONE SODIUM 1 G IJ SOLR
500.0000 mg | Freq: Once | INTRAMUSCULAR | Status: AC
  Administered 2013-08-23: 500 mg via INTRAMUSCULAR

## 2013-08-23 MED ORDER — LEVOFLOXACIN 750 MG PO TABS: 750.0000 mg | ORAL_TABLET | Freq: Every day | ORAL | Status: DC

## 2013-08-23 NOTE — Patient Instructions (Addendum)
F/u in 4 month, call if you need me before  You are treated today for acute sinusitis and pharyngitis  Throat swab and hBa1C today please   Rocephin 500mg  IM infice followed by a TEN day course of antibiotc, you need to take the entire 10 days  Fluconazole # 3 tablets prescribed also if you get vaginal itch with the antibiotic (normal for many) Salt water gargles 3 to 4 times daily and tylenol for pain and fever   Work excuse from 4/6 to return 4/10, call on Thursday morning if you dont feel well enough to return then  Continue to focus on diet rich in vegetable and fruit and regular exercise and smaller ortions regularly ,a t least 4 to 6 scheduled "eat times per day over a 12 hour period is a good plan  Weight loss goal of 2 pouns per monthover next 4 month, is a GOOD place to start  PLEASE call your GI Doc and schedule your follow up sigmoidoscopy due this month  Sinusitis Sinusitis is redness, soreness, and puffiness (inflammation) of the air pockets in the bones of your face (sinuses). The redness, soreness, and puffiness can cause air and mucus to get trapped in your sinuses. This can allow germs to grow and cause an infection.  HOME CARE   Drink enough fluids to keep your pee (urine) clear or pale yellow.  Use a humidifier in your home.  Run a hot shower to create steam in the bathroom. Sit in the bathroom with the door closed. Breathe in the steam 3 4 times a day.  Put a warm, moist washcloth on your face 3 4 times a day, or as told by your doctor.  Use salt water sprays (saline sprays) to wet the thick fluid in your nose. This can help the sinuses drain.  Only take medicine as told by your doctor. GET HELP RIGHT AWAY IF:   Your pain gets worse.  You have very bad headaches.  You are sick to your stomach (nauseous).  You throw up (vomit).  You are very sleepy (drowsy) all the time.  Your face is puffy (swollen).  Your vision changes.  You have a stiff  neck.  You have trouble breathing. MAKE SURE YOU:   Understand these instructions.  Will watch your condition.  Will get help right away if you are not doing well or get worse. Document Released: 10/22/2007 Document Revised: 01/28/2012 Document Reviewed: 12/09/2011 [facility] Patient Information 2014 [place].

## 2013-08-23 NOTE — Telephone Encounter (Signed)
Patient states that she has had symptoms for a duration of 10 days.  Fever has gotten as high as 100.3.  Sinus drainage is yellow/green.  [facility] locally.

## 2013-08-23 NOTE — Progress Notes (Signed)
Subjective:    Patient ID: Caitlin Sampson, female    DOB: [birth year], 53 y.o.   MRN: [id number]  HPI 4 day h/o increased facial pressure over  right jaw radiaiting to right ear  And into the teeth, generalized bodyaches , no sore throat , no chest congestion, has been under the weather for 10 days but condition deteriorated in past 4 days. 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 No success to date withj weight loss through lifestyle change to improve her health, and recent lipid panel showed uncontrolled lipids , she is now on zetia and lipitor per cardiology recommendation.Her son recently needed ablation for WPW   Review of Systems See HPI Denies chest pains, palpitations and leg swelling Denies abdominal pain, nausea, vomiting,diarrhea or constipation.   Denies dysuria, frequency, hesitancy or incontinence. Denies joint pain, swelling and limitation in mobility. Denies headaches, seizures, numbness, or tingling. Denies depression, anxiety or insomnia. Denies skin break down or rash.        Objective:   Physical Exam BP 122/82  Pulse 72  Temp(Src) 98.7 F (37.1 C)  Resp 18  Ht 5\' 2"  (1.575 m)  Wt 232 lb (105.235 kg)  BMI 42.42 kg/m2  SpO2 97% Patient alert and oriented and in no cardiopulmonary distress.  HEENT: No facial asymmetry, EOMI, rigthmaxillary sinus tenderness,  oropharynx pink and moist.  Neck supple bilateral anterior cervical adenitis, left more than right. White exudate , scant on left tonsil . tM clear  Chest: Clear to auscultation bilaterally.  CVS: S1, S2 no murmurs, no S3.  ABD: Soft non tender. Bowel sounds normal.  Ext: No edema  MS: Adequate ROM spine, shoulders, hips and knees.  Skin: Intact, no ulcerations or rash noted.  Psych: Good eye contact, normal affect.  Memory intact not anxious or depressed appearing.  CNS: CN 2-12 intact, power, tone and sensation normal throughout.        Assessment & Plan:  Maxillary sinusitis, acute Seven day course of levaquin prescribed and Rocephin administered in the office. Work excuse for 3 days  Acute pharyngitis Throat swab negative for strep  HYPERLIPIDEMIA Uncontrolled Hyperlipidemia:Low fat diet discussed and encouraged.   Updated lab needed at/ before next visit.   MORBID OBESITY Unchanged Patient re-educated about  the importance of commitment to a  minimum of 150 minutes of exercise per week. The importance of healthy food choices with portion control discussed. Encouraged to start a food diary, count calories and to consider  joining a support group. Sample diet sheets offered. Goals set by the patient for the next several months.     Metabolic syndrome X The increased risk of cardiovascular disease associated with this diagnosis, and the need to consistently work on lifestyle to change this is discussed. Following  a  heart healthy diet ,commitment to 30 minutes of exercise at least 5 days per week, as well as control of blood sugar and cholesterol , and achieving a healthy weight are all the areas to be addressed .   Prediabetes Unchanged. Patient educated about the importance of limiting  Carbohydrate intake , the need to commit to daily physical activity for a minimum of 30 minutes , and to commit weight loss. The fact that changes in all these areas will reduce or eliminate all together the development of diabetes is  stressed.     HYPERTENSION Controlled, no change in medication DASH diet and commitment to daily physical activity for a minimum of 30 minutes discussed and encouraged, as a part of hypertension management. The importance of attaining a healthy weight is also discussed.

## 2013-08-23 NOTE — Telephone Encounter (Signed)
Work in this pm

## 2013-08-24 LAB — HEMOGLOBIN A1C
Hgb A1c MFr Bld: 6.2 % — ABNORMAL HIGH (ref <5.7)
Mean Plasma Glucose: 131 mg/dL — ABNORMAL HIGH (ref <117)

## 2013-08-25 DIAGNOSIS — J0100 Acute maxillary sinusitis, unspecified: Secondary | ICD-10-CM | POA: Insufficient documentation

## 2013-08-25 DIAGNOSIS — J029 Acute pharyngitis, unspecified: Secondary | ICD-10-CM | POA: Insufficient documentation

## 2013-08-25 LAB — POCT RAPID STREP A (OFFICE): Rapid Strep A Screen: NEGATIVE

## 2013-08-25 NOTE — Assessment & Plan Note (Signed)
Seven day course of levaquin prescribed and Rocephin administered in the office. Work excuse for 3 days

## 2013-08-25 NOTE — Assessment & Plan Note (Signed)
Uncontrolled. Hyperlipidemia:Low fat diet discussed and encouraged.  Updated lab needed at/ before next visit.  

## 2013-08-25 NOTE — Assessment & Plan Note (Signed)
Unchanged. Patient re-educated about  the importance of commitment to a  minimum of 150 minutes of exercise per week. The importance of healthy food choices with portion control discussed. Encouraged to start a food diary, count calories and to consider  joining a support group. Sample diet sheets offered. Goals set by the patient for the next several months.    

## 2013-08-25 NOTE — Assessment & Plan Note (Signed)
Unchanged Patient educated about the importance of limiting  Carbohydrate intake , the need to commit to daily physical activity for a minimum of 30 minutes , and to commit weight loss. The fact that changes in all these areas will reduce or eliminate all together the development of diabetes is stressed.    

## 2013-08-25 NOTE — Assessment & Plan Note (Signed)
Throat swab negative for strep

## 2013-08-25 NOTE — Assessment & Plan Note (Signed)
Controlled, no change in medication DASH diet and commitment to daily physical activity for a minimum of 30 minutes discussed and encouraged, as a part of hypertension management. The importance of attaining a healthy weight is also discussed.  

## 2013-08-25 NOTE — Assessment & Plan Note (Signed)
The increased risk of cardiovascular disease associated with this diagnosis, and the need to consistently work on lifestyle to change this is discussed. Following  a  heart healthy diet ,commitment to 30 minutes of exercise at least 5 days per week, as well as control of blood sugar and cholesterol , and achieving a healthy weight are all the areas to be addressed .  

## 2013-09-01 ENCOUNTER — Telehealth: Payer: Self-pay | Admitting: Family Medicine

## 2013-09-01 ENCOUNTER — Other Ambulatory Visit: Payer: Self-pay

## 2013-09-01 DIAGNOSIS — F411 Generalized anxiety disorder: Secondary | ICD-10-CM

## 2013-09-01 MED ORDER — FLUOXETINE HCL 20 MG PO CAPS: 20.0000 mg | ORAL_CAPSULE | Freq: Every day | ORAL | Status: DC

## 2013-09-01 MED ORDER — ESOMEPRAZOLE MAGNESIUM 40 MG PO CPDR: 40.0000 mg | DELAYED_RELEASE_CAPSULE | Freq: Every day | ORAL | Status: DC

## 2013-09-01 NOTE — Telephone Encounter (Signed)
Order sent.

## 2013-09-09 NOTE — Telephone Encounter (Signed)
Encounter closed--09/09/12 tp 

## 2013-10-05 ENCOUNTER — Telehealth: Payer: Self-pay

## 2013-10-05 MED ORDER — AZITHROMYCIN 250 MG PO TABS: ORAL_TABLET | ORAL | Status: DC

## 2013-10-05 NOTE — Telephone Encounter (Signed)
Med ordered.  Voicemail left for patient notifying of advice.

## 2013-10-05 NOTE — Addendum Note (Signed)
Addended by: Denman George B on: 10/05/2013 11:20 AM   Modules accepted: Orders

## 2013-10-05 NOTE — Telephone Encounter (Signed)
Since recently had similar symptom pls erx z pack x 1, let her know if has ear pain next Monday call for referral to ENT , if rthere is any problem/concern with this pls let me know

## 2013-10-06 ENCOUNTER — Telehealth: Payer: Self-pay | Admitting: Family Medicine

## 2013-10-06 ENCOUNTER — Other Ambulatory Visit: Payer: Self-pay

## 2013-10-06 MED ORDER — AZITHROMYCIN 250 MG PO TABS: ORAL_TABLET | ORAL | Status: DC

## 2013-10-06 NOTE — Telephone Encounter (Signed)
Concern addressed

## 2013-10-06 NOTE — Telephone Encounter (Signed)
[facility] Cancelled at [facility]. Override done so she should be able [facility] today

## 2014-02-01 ENCOUNTER — Other Ambulatory Visit: Payer: Self-pay | Admitting: Obstetrics and Gynecology

## 2014-02-02 LAB — CYTOLOGY - PAP

## 2014-02-21 ENCOUNTER — Other Ambulatory Visit: Payer: Self-pay | Admitting: Cardiovascular Disease

## 2014-02-21 LAB — LIPID PANEL
CHOL/HDL RATIO: 2.8 ratio
Cholesterol: 183 mg/dL (ref 0–200)
HDL: 66 mg/dL (ref >39)
LDL Cholesterol: 86 mg/dL (ref 0–99)
Triglycerides: 154 mg/dL — ABNORMAL HIGH (ref <150)
VLDL: 31 mg/dL (ref 0–40)

## 2014-02-21 LAB — CBC
HEMATOCRIT: 38 % (ref 36.0–46.0)
Hemoglobin: 12.4 g/dL (ref 12.0–15.0)
MCH: 27.9 pg (ref 26.0–34.0)
MCHC: 32.6 g/dL (ref 30.0–36.0)
MCV: 85.6 fL (ref 78.0–100.0)
PLATELETS: 358 10*3/uL (ref 150–400)
RBC: 4.44 MIL/uL (ref 3.87–5.11)
RDW: 14.5 % (ref 11.5–15.5)
WBC: 8.7 10*3/uL (ref 4.0–10.5)

## 2014-02-21 LAB — COMPREHENSIVE METABOLIC PANEL
ALT: 23 U/L (ref 0–35)
AST: 23 U/L (ref 0–37)
Albumin: 4.2 g/dL (ref 3.5–5.2)
Alkaline Phosphatase: 70 U/L (ref 39–117)
BILIRUBIN TOTAL: 0.3 mg/dL (ref 0.2–1.2)
BUN: 13 mg/dL (ref 6–23)
CALCIUM: 9.8 mg/dL (ref 8.4–10.5)
CHLORIDE: 101 meq/L (ref 96–112)
CO2: 30 mEq/L (ref 19–32)
CREATININE: 0.7 mg/dL (ref 0.50–1.10)
Glucose, Bld: 112 mg/dL — ABNORMAL HIGH (ref 70–99)
Potassium: 4.2 mEq/L (ref 3.5–5.3)
Sodium: 141 mEq/L (ref 135–145)
Total Protein: 6.7 g/dL (ref 6.0–8.3)

## 2014-02-21 LAB — HEMOGLOBIN A1C
Hgb A1c MFr Bld: 6.3 % — ABNORMAL HIGH (ref <5.7)
Mean Plasma Glucose: 134 mg/dL — ABNORMAL HIGH (ref <117)

## 2014-02-21 LAB — TSH: TSH: 2.244 u[IU]/mL (ref 0.350–4.500)

## 2014-02-22 ENCOUNTER — Encounter: Payer: Self-pay | Admitting: Cardiovascular Disease

## 2014-02-22 ENCOUNTER — Ambulatory Visit (INDEPENDENT_AMBULATORY_CARE_PROVIDER_SITE_OTHER): Payer: BC Managed Care – PPO | Admitting: Cardiovascular Disease

## 2014-02-22 VITALS — BP 148/88 | HR 78 | Ht 62.0 in | Wt 236.0 lb

## 2014-02-22 DIAGNOSIS — E8881 Metabolic syndrome: Secondary | ICD-10-CM

## 2014-02-22 DIAGNOSIS — R7303 Prediabetes: Secondary | ICD-10-CM

## 2014-02-22 DIAGNOSIS — R002 Palpitations: Secondary | ICD-10-CM

## 2014-02-22 DIAGNOSIS — R7309 Other abnormal glucose: Secondary | ICD-10-CM

## 2014-02-22 DIAGNOSIS — I10 Essential (primary) hypertension: Secondary | ICD-10-CM

## 2014-02-22 DIAGNOSIS — K219 Gastro-esophageal reflux disease without esophagitis: Secondary | ICD-10-CM

## 2014-02-22 MED ORDER — ROSUVASTATIN CALCIUM 10 MG PO TABS: 10.0000 mg | ORAL_TABLET | Freq: Every day | ORAL | Status: DC

## 2014-02-22 MED ORDER — METFORMIN HCL 500 MG PO TABS: 500.0000 mg | ORAL_TABLET | Freq: Every day | ORAL | Status: DC

## 2014-02-22 MED ORDER — POTASSIUM CHLORIDE CRYS ER 10 MEQ PO TBCR: EXTENDED_RELEASE_TABLET | ORAL | Status: DC

## 2014-02-22 MED ORDER — VALSARTAN-HYDROCHLOROTHIAZIDE 160-12.5 MG PO TABS: 1.0000 | ORAL_TABLET | Freq: Every day | ORAL | Status: DC

## 2014-02-22 MED ORDER — EZETIMIBE 10 MG PO TABS: 10.0000 mg | ORAL_TABLET | Freq: Every day | ORAL | Status: DC

## 2014-02-22 MED ORDER — ESOMEPRAZOLE MAGNESIUM 40 MG PO CPDR: 40.0000 mg | DELAYED_RELEASE_CAPSULE | Freq: Every day | ORAL | Status: DC

## 2014-02-22 NOTE — Patient Instructions (Signed)
Your physician has recommended you make the following change in your medication: start new metformin prescription. This has already been sent to your pharmacy.   Your physician wants you to follow-up in: 6 months or sooner if needed. You will receive a reminder letter in the mail two months in advance. If you don't receive a letter, please call our office to schedule the follow-up appointment.

## 2014-02-22 NOTE — Progress Notes (Signed)
Patient ID: Caitlin Sampson, female   DOB: Sep 29, 1960, 53 y.o.   MRN: [id number]     HPI: Caitlin Sampson is a 53 y.o. female as to the office today for 76-month cardiology evaluation.  Caitlin Sampson has a history of hypertension, hyperlipidemia, obesity, and metabolic syndrome/borderline diabetes. There is a strong family history for CAD with her father suffering an initial myocardial infarction at age 90.  She underwent a cardiac catheterization 10 years ago in 2005 for chest pain. She was found to have mid LAD intramyocardial segment but did not have obstructive disease.  In the fall of 2013 she developed episodes of chest tightness with mild shortness of breath. A nuclear perfusion study in December 2013 revealed normal perfusion and function with normal wall motion.  She also has a history of GERD. She's not been very successful with weight loss.  She has a history of hyperlipidemia in the past has developed myalgias with Lipitor and higher dose statin.  She also has metabolic syndrome and had been started on metformin 500 mg, but apparently stopped taking this when she ran out.  Her prescription. An NMR profile revealed a LDL particle number  increased at 1995 particles with an LDL calculated at 146. Total cholesterol 236. Small LDL particle number was increased at 705. Insulin resistance who was increased at 53. Had increased VLDL size at 50  When I last saw her in February, I recommended a rechallenge of statin therapy.  At that time, she was started on Crestor 10 mg every third day.  She is a 20 dances to every other day.  She also takes coenzyme Q10 300 mg.  She has been on valsartan HCT 160/12.5 for her blood pressure.  In addition, she had been started on Zetia 10 mg for more aggressive lipid lowering therapy in combination with her reduced dose statin.  She underwent repeat blood work yesterday, which does show improvement such that her total cholesterol is now 183, triglycerides 154, HDL 66, and  LDL has reduced from 146 to 86.   Caitlin Sampson denies recent chest pain. She does have GERD for which he takes Nexium. She denies PND orthopnea. She denies palpitations. She states she exercises 3 days per week.  Past Medical History  Diagnosis Date  . Diverticulosis of colon 2012  . Hypertension   . Hyperlipidemia   . Anxiety   . Poison oak dermatitis 2012    Past Surgical History  Procedure Laterality Date  . Cholecystectomy    . Abdominal hysterectomy      Allergies  Allergen Reactions  . Vytorin [Ezetimibe-Simvastatin] Other (See Comments)    Joint pain    Current Outpatient Prescriptions  Medication Sig Dispense Refill  . aspirin 81 MG tablet Take 81 mg by mouth daily.        Marland Kitchen esomeprazole (NEXIUM) 40 MG capsule Take 1 capsule (40 mg total) by mouth daily before breakfast.  90 capsule  1  . ezetimibe (ZETIA) 10 MG tablet Take 1 tablet (10 mg total) by mouth daily.  90 tablet  3  . FLUoxetine (PROZAC) 20 MG capsule Take 1 capsule (20 mg total) by mouth daily.  90 capsule  1  . loratadine (CLARITIN) 10 MG tablet Take 10 mg by mouth daily as needed.       . potassium chloride (K-DUR,KLOR-CON) 10 MEQ tablet TAKE 1 TABLET (10 MEQ TOTAL)  BY MOUTH  DAILY.  90 tablet  3  . rosuvastatin (CRESTOR) 10 MG tablet  Take 1 tablet every other day  45 tablet  3  . valsartan-hydrochlorothiazide (DIOVAN HCT) 160-12.5 MG per tablet Take 1 tablet by mouth daily.  90 tablet  3  . [DISCONTINUED] potassium chloride (K-DUR) 10 MEQ tablet Take 1 tablet (10 mEq total) by mouth 2 (two) times daily.  60 tablet  5   No current facility-administered medications for this visit.    History   Social History  . Marital Status: Married    Spouse Name: N/A    Number of Children: N/A  . Years of Education: N/A   Occupational History  . Not on file.   Social History Main Topics  . Smoking status: Former Research scientist (life sciences)  . Smokeless tobacco: Not on file  . Alcohol Use: Not on file  . Drug Use: Not on file    . Sexual Activity: Not on file   Other Topics Concern  . Not on file   Social History Narrative  . No narrative on file   Socially she is married and has one child. She quit smoking greater than 17 years ago. She does exercise.  Family History  Problem Relation Age of Onset  . Heart disease Mother     a fib  . Kidney disease Mother   . Heart disease Father     MI  . Hyperlipidemia Father   . Hypertension Father   . Kidney disease Father   . Kidney disease Brother    ROS General: Negative; No fevers, chills, or night sweats; no success with recent weight loss HEENT: Negative; No changes in vision or hearing, sinus congestion, difficulty swallowing Pulmonary: Negative; No cough, wheezing, shortness of breath, hemoptysis Cardiovascular: Negative; No chest pain, presyncope, syncope, palpitations GI: Positive for GERD No nausea, vomiting, diarrhea, or abdominal pain GU: Negative; No dysuria, hematuria, or difficulty voiding Musculoskeletal: Negative; no myalgias, joint pain, or weakness Hematologic/Oncology: Negative; no easy bruising, bleeding Immunologic: Positive for allergies, for which he takes Claritin on an as-needed basis. Endocrine: Negative; no heat/cold intolerance; no diabetes Neuro: Negative; no changes in balance, headaches Skin: Negative; No rashes or skin lesions Psychiatric: Negative; No behavioral problems, depression Sleep: Negative; No snoring, daytime sleepiness, hypersomnolence, bruxism, restless legs, hypnogognic hallucinations, no cataplexy Other comprehensive 14 point system review is negative.   PE BP 148/88  Pulse 78  Ht 5\' 2"  (1.575 m)  Wt 236 lb (107.049 kg)  BMI 43.15 kg/m2  Repeat blood pressure by me 124/80 General: Alert, oriented, no distress.  Skin: normal turgor, no rashes HEENT: Normocephalic, atraumatic. Pupils round and reactive; sclera anicteric;no lid lag. Extraocular muscles intact. No xanthelasma Nose without nasal septal  hypertrophy Mouth/Parynx benign; Mallinpatti scale 3 Neck: No JVD, no carotid bruits; normal carotid upstroke Lungs: clear to ausculatation and percussion; no wheezing or rales Chest wall: no tenderness to palpitation Heart: RRR, s1 s2 normal 1/6 systolic murmur. No S3 gallop. No S4.  No diastolic murmur No thrills or heaves. Abdomen: soft, nontender; no hepatosplenomehaly, BS+; abdominal aorta nontender and not dilated by palpation. Back: no CVA tenderness Pulses 2+ Extremities: no clubbing cyanosis or edema, Homan's sign negative  Neurologic: grossly nonfocal; cranial nerves grossly normal. Psychologic: normal affect and mood.  ECG (independently read by me): Normal sinus rhythm at 78 beats per minute.  PRWP progression.  No significant ST changes.   Prior February 2015 ECG (independently read by me): Sinus rhythm at 80 beats per minute. No significant ST-T changes., Nonspecific  LABS:  BMET    Component Value  Date/Time   NA 141 02/21/2014 0732   K 4.2 02/21/2014 0732   CL 101 02/21/2014 0732   CO2 30 02/21/2014 0732   GLUCOSE 112* 02/21/2014 0732   BUN 13 02/21/2014 0732   CREATININE 0.70 02/21/2014 0732   CREATININE 0.58 05/03/2010 1936   CALCIUM 9.8 02/21/2014 0732     Hepatic Function Panel     Component Value Date/Time   PROT 6.7 02/21/2014 0732   ALBUMIN 4.2 02/21/2014 0732   AST 23 02/21/2014 0732   ALT 23 02/21/2014 0732   ALKPHOS 70 02/21/2014 0732   BILITOT 0.3 02/21/2014 0732   BILIDIR 0.1 10/28/2012 0840   IBILI 0.3 10/28/2012 0840     CBC    Component Value Date/Time   WBC 8.7 02/21/2014 0732   RBC 4.44 02/21/2014 0732   HGB 12.4 02/21/2014 0732   HCT 38.0 02/21/2014 0732   PLT 358 02/21/2014 0732   MCV 85.6 02/21/2014 0732   MCH 27.9 02/21/2014 0732   MCHC 32.6 02/21/2014 0732   RDW 14.5 02/21/2014 0732   LYMPHSABS 2.1 10/28/2012 0830   MONOABS 0.9 10/28/2012 0830   EOSABS 0.1 10/28/2012 0830   BASOSABS 0.0 10/28/2012 0830     BNP No results found for this  basename: probnp    Lipid Panel     Component Value Date/Time   CHOL 183 02/21/2014 0732   TRIG 154* 02/21/2014 0732   TRIG 132 06/15/2013 0849   HDL 66 02/21/2014 0732   CHOLHDL 2.8 02/21/2014 0732   VLDL 31 02/21/2014 0732   LDLCALC 86 02/21/2014 0732   LDLCALC 146* 06/15/2013 0849     RADIOLOGY: No results found.    ASSESSMENT AND PLAN: Ms. Jazzmon Prindle is a 53 year old female who has morbid obesity, history of hypertension, hyperlipidemia, and metabolic syndrome.  Since I last saw her, she has gained additional weight, so that her body mass index today is 43.15 kg per meter squared.  When I saw her last, we reinstituted low-dose Crestor, and she is taking this now every other day.  Said he was also added for more aggressive lipid lowering.  Her most recent lipid panel done yesterday shows significant improvement.  She is tolerating the every other day dosing.  I have now recommended to further increase her Crestor to 10 mg daily.  Her hemoglobin A1c is increased at 6.3, and I've written a prescription for her to resume metformin, initially 500 mg daily.  In 3 months a followup hemoglobin A1c should be obtained and she should follow up with Dr. Moshe Cipro and if still elevated this should be increased to twice a day.  We again discussed exercise, as well as weight loss.  Her blood pressure today is improved and on repeat by me was 138/70.  She is not having any GERD symptoms, and this is controlled with Nexium 40 mg daily.  I will see her in 6 months for reevaluation or sooner if problems arise.   Troy Sine, MD, [facility]  02/22/2014 5:18 PM

## 2014-02-28 ENCOUNTER — Telehealth: Payer: Self-pay | Admitting: Family Medicine

## 2014-02-28 DIAGNOSIS — R7303 Prediabetes: Secondary | ICD-10-CM

## 2014-02-28 DIAGNOSIS — E785 Hyperlipidemia, unspecified: Secondary | ICD-10-CM

## 2014-02-28 DIAGNOSIS — I10 Essential (primary) hypertension: Secondary | ICD-10-CM

## 2014-02-28 NOTE — Telephone Encounter (Signed)
Pls contact pt. Let her know I have heard good news from her gI Doc. I have recently had report from Cardiology. She needs to take the metformin he prescribed unless she does not tolerate , if so call me Needs to have HBA1C, fasting lipid, cmp and EGFr in mid to end Feb and sched f/u with me, none on record, per her discussion with cardiology this is the reported plan also. Pls order labs if she agrees and give her f/u appt info also please  Let her know about community diabetic educator, since she continues to struggle with blood sugar  (tho NOT a diabetic) as well as her weight I think it is a worh twhile plan refer if she agrees please)

## 2014-02-28 NOTE — Telephone Encounter (Signed)
Pt aware appt scheduled and labs mailed to her

## 2014-02-28 NOTE — Addendum Note (Signed)
Addended by: Eual Fines on: 02/28/2014 01:36 PM   Modules accepted: Orders

## 2014-04-03 ENCOUNTER — Telehealth: Payer: Self-pay

## 2014-04-05 ENCOUNTER — Telehealth: Payer: Self-pay

## 2014-04-05 ENCOUNTER — Other Ambulatory Visit: Payer: Self-pay

## 2014-04-05 DIAGNOSIS — F411 Generalized anxiety disorder: Secondary | ICD-10-CM

## 2014-04-05 MED ORDER — FLUOXETINE HCL 20 MG PO CAPS: 20.0000 mg | ORAL_CAPSULE | Freq: Every day | ORAL | Status: DC

## 2014-04-05 NOTE — Telephone Encounter (Signed)
30 day supply sent to local and routine refill of 90 days sent to mail order ([facility])   Patient aware

## 2014-04-06 ENCOUNTER — Other Ambulatory Visit: Payer: Self-pay

## 2014-04-06 DIAGNOSIS — F411 Generalized anxiety disorder: Secondary | ICD-10-CM

## 2014-04-06 MED ORDER — FLUOXETINE HCL 20 MG PO CAPS: 20.0000 mg | ORAL_CAPSULE | Freq: Every day | ORAL | Status: DC

## 2014-06-19 ENCOUNTER — Encounter: Payer: Self-pay | Admitting: Family Medicine

## 2014-06-19 ENCOUNTER — Ambulatory Visit (INDEPENDENT_AMBULATORY_CARE_PROVIDER_SITE_OTHER): Payer: 59 | Admitting: Family Medicine

## 2014-06-19 VITALS — BP 138/88 | HR 80 | Resp 16 | Ht 62.0 in | Wt 246.0 lb

## 2014-06-19 DIAGNOSIS — I10 Essential (primary) hypertension: Secondary | ICD-10-CM

## 2014-06-19 DIAGNOSIS — F411 Generalized anxiety disorder: Secondary | ICD-10-CM

## 2014-06-19 DIAGNOSIS — K589 Irritable bowel syndrome without diarrhea: Secondary | ICD-10-CM

## 2014-06-19 DIAGNOSIS — R0789 Other chest pain: Secondary | ICD-10-CM

## 2014-06-19 DIAGNOSIS — R079 Chest pain, unspecified: Secondary | ICD-10-CM

## 2014-06-19 LAB — TROPONIN I: Troponin I: 0.01 ng/mL (ref <0.06)

## 2014-06-19 NOTE — Patient Instructions (Signed)
F/u as before  EKG in office is not concerning for an acute cadiac event, but I will get blood work and call you this pm about it to substantiate this, I will also be in touch wih your cardiologist  All the very best with your change in eating habits

## 2014-06-19 NOTE — Progress Notes (Signed)
Subjective:    Patient ID: Caitlin Sampson, female    DOB: 22-Dec-1960, 54 y.o.   MRN: [id number]  HPI  2 day h/o left chest pain radiating down her arm, awoke with this numbness and tingling, feels as y though she put a lot of pressure on the arm during her sleep Pain not aggravated by movement, no associated nausea or diaphoresis or light headedness Recently started weight loss program at a clinic and specifically wants to review supplements/ meds and HCG which she is receiving  As apart of this. Since she is on her new diet , also notes abdominal bloating, intake currently is mainly plant based. She has also started more regular exercise  In recent times she and her spouse have had significant emotional stress with regard to their only child an adult son, however , the family is working through this, she declines therapy , but is happy for higher dose of fluoxetine , which she has had in the past, and also limited access to xanax , as needed, short term  Review of Systems See HPI Denies recent fever or chills. Denies sinus pressure, nasal congestion, ear pain or sore throat. Denies chest congestion, productive cough or wheezing. Denies PND,orthopnea , palpitations and leg swelling Denies ,diarrhea or constipation.   Denies dysuria, frequency, hesitancy or incontinence. Denies joint pain, swelling and limitation in mobility. Denies headaches, seizures, numbness, or tingling. . Denies skin break down or rash.        Objective:   Physical Exam BP 138/88 mmHg  Pulse 80  Resp 16  Ht 5\' 2"  (1.575 m)  Wt 246 lb (111.585 kg)  BMI 44.98 kg/m2  SpO2 99% Patient alert and oriented and in no cardiopulmonary distress.Tearful at times, anxioius  HEENT: No facial asymmetry, EOMI,   oropharynx pink and moist.  Neck supple no JVD, no mass.  Chest: Clear to auscultation bilaterally.No reproducible chest wall pain  CVS: S1, S2 no murmurs, no S3.Regular rate. EKG : unchanged when compared  to 04/2014 tracing ABD: Soft non tender.   Ext: No edema  MS: Adequate ROM spine, shoulders, hips and knees.  Skin: Intact, no ulcerations or rash noted.  Psych: Good eye contact, . Memory intact  anxious , tearful and  depressed appearing.  CNS: CN 2-12 intact, power,  normal throughout.no focal deficits noted.        Assessment & Plan:  Atypical chest pain EKG shows NSR , with possible inferior infarct, undetermined age, when compared with EKG in [facility] , 2015 by her cardiologist, there is no difference, Will f/u tropnin, i do not believe this is ACS, des[ppite multiple risk factors. Pt has been under increased emotional stress due to family issues and this is likely the main culprit   Essential hypertension Sub optimal control, no med change pt anxious and visibly distressed at visit.   IBS Increased bloating and distension with dietary change, symptomatic medication as she is on specific eating plan from her weight loss program. He is reassured that her abdominal exam reveals no acute illness   GAD (generalized anxiety disorder) Increased and uncontrolled, due to recent family stress, pt to increase fluoxtine dose to 20mg  daily   MORBID OBESITY Deteriorated. Patient re-educated about  the importance of commitment to a  minimum of 150 minutes of exercise per week.  The importance of healthy food choices with portion control discussed. Encouraged to start a food diary, count calories and to consider  joining a support group. Sample  diet sheets offered. Goals set by the patient for the next several months.   Wt Readings from Last 3 Encounters:  07/25/14 228 lb (103.42 kg)  07/12/14 230 lb (104.327 kg)  06/19/14 246 lb (111.585 kg)    Body mass index is 44.98 kg/(m^2).  Current exercise per week 120 minutes.

## 2014-06-20 ENCOUNTER — Telehealth: Payer: Self-pay

## 2014-06-20 MED ORDER — ALPRAZOLAM 0.25 MG PO TABS: ORAL_TABLET | ORAL | Status: DC

## 2014-06-20 NOTE — Telephone Encounter (Signed)
Med sent.

## 2014-06-20 NOTE — Telephone Encounter (Signed)
Script printed, please fax and let her know

## 2014-06-28 ENCOUNTER — Encounter: Payer: Self-pay | Admitting: Family Medicine

## 2014-07-07 LAB — COMPLETE METABOLIC PANEL WITH GFR
ALBUMIN: 4.3 g/dL (ref 3.5–5.2)
ALK PHOS: 65 U/L (ref 39–117)
ALT: 26 U/L (ref 0–35)
AST: 22 U/L (ref 0–37)
BUN: 11 mg/dL (ref 6–23)
CHLORIDE: 98 meq/L (ref 96–112)
CO2: 28 mEq/L (ref 19–32)
Calcium: 10.1 mg/dL (ref 8.4–10.5)
Creat: 0.65 mg/dL (ref 0.50–1.10)
GFR, Est African American: >89 mL/min
GFR, Est Non African American: >89 mL/min
Glucose, Bld: 110 mg/dL — ABNORMAL HIGH (ref 70–99)
POTASSIUM: 3.5 meq/L (ref 3.5–5.3)
SODIUM: 139 meq/L (ref 135–145)
Total Bilirubin: 0.3 mg/dL (ref 0.2–1.2)
Total Protein: 6.7 g/dL (ref 6.0–8.3)

## 2014-07-07 LAB — LIPID PANEL
CHOL/HDL RATIO: 2.2 ratio
CHOLESTEROL: 113 mg/dL (ref 0–200)
HDL: 52 mg/dL (ref >39)
LDL Cholesterol: 38 mg/dL (ref 0–99)
TRIGLYCERIDES: 117 mg/dL (ref <150)
VLDL: 23 mg/dL (ref 0–40)

## 2014-07-07 LAB — HEMOGLOBIN A1C
HEMOGLOBIN A1C: 6.3 % — AB (ref <5.7)
MEAN PLASMA GLUCOSE: 134 mg/dL — AB (ref <117)

## 2014-07-12 ENCOUNTER — Ambulatory Visit (INDEPENDENT_AMBULATORY_CARE_PROVIDER_SITE_OTHER): Payer: 59 | Admitting: Family Medicine

## 2014-07-12 ENCOUNTER — Encounter: Payer: Self-pay | Admitting: Family Medicine

## 2014-07-12 ENCOUNTER — Ambulatory Visit: Payer: BC Managed Care – PPO | Admitting: Family Medicine

## 2014-07-12 VITALS — BP 126/78 | HR 78 | Resp 18 | Ht 62.0 in | Wt 230.0 lb

## 2014-07-12 DIAGNOSIS — R7309 Other abnormal glucose: Secondary | ICD-10-CM

## 2014-07-12 DIAGNOSIS — Z1159 Encounter for screening for other viral diseases: Secondary | ICD-10-CM

## 2014-07-12 DIAGNOSIS — E785 Hyperlipidemia, unspecified: Secondary | ICD-10-CM

## 2014-07-12 DIAGNOSIS — I10 Essential (primary) hypertension: Secondary | ICD-10-CM

## 2014-07-12 DIAGNOSIS — K219 Gastro-esophageal reflux disease without esophagitis: Secondary | ICD-10-CM

## 2014-07-12 DIAGNOSIS — R7303 Prediabetes: Secondary | ICD-10-CM

## 2014-07-12 MED ORDER — ESOMEPRAZOLE MAGNESIUM 40 MG PO PACK: 40.0000 mg | PACK | Freq: Every day | ORAL | Status: DC

## 2014-07-12 MED ORDER — ROSUVASTATIN CALCIUM 10 MG PO TABS: 10.0000 mg | ORAL_TABLET | Freq: Every day | ORAL | Status: DC

## 2014-07-12 NOTE — Assessment & Plan Note (Signed)
Controlled, no change in medication DASH diet and commitment to daily physical activity for a minimum of 30 minutes discussed and encouraged, as a part of hypertension management. The importance of attaining a healthy weight is also discussed.  

## 2014-07-12 NOTE — Progress Notes (Signed)
   Subjective:    Patient ID: Caitlin Sampson, female    DOB: [birth year], 54 y.o.   MRN: [id number]  HPI The PT is here for follow up and re-evaluation of chronic medical conditions, medication management and review of any available recent lab and radiology data.  Preventive health is updated, specifically  Cancer screening and Immunization.   Questions or concerns regarding consultations or procedures which the PT has had in the interim are  addressed. The PT denies any adverse reactions to current medications since the last visit. Wants to and needs to change to generic meds with new ins There are no new concerns.  There are no specific complaints  Feels much better as far as anxiety and depression of fluoxetine. No need for therapy at this time Excellent weight loss on new eating plan     Review of Systems See HPI Denies recent fever or chills. Denies sinus pressure, nasal congestion, ear pain or sore throat. Denies chest congestion, productive cough or wheezing. Denies chest pains, palpitations and leg swelling Denies abdominal pain, nausea, vomiting,diarrhea or constipation.   Denies dysuria, frequency, hesitancy or incontinence. Denies joint pain, swelling and limitation in mobility. Denies headaches, seizures, numbness, or tingling. Denies depression, anxiety or insomnia. Denies skin break down or rash.        Objective:   Physical Exam BP 126/78 mmHg  Pulse 78  Resp 18  Ht 5\' 2"  (1.575 m)  Wt 230 lb (104.327 kg)  BMI 42.06 kg/m2  SpO2 93% Patient alert and oriented and in no cardiopulmonary distress.  HEENT: No facial asymmetry, EOMI,   oropharynx pink and moist.  Neck supple no JVD, no mass.  Chest: Clear to auscultation bilaterally.  CVS: S1, S2 no murmurs, no S3.Regular rate.  ABD: Soft non tender.   Ext: No edema  MS: Adequate ROM spine, shoulders, hips and knees.  Skin: Intact, no ulcerations or rash noted.  Psych: Good eye contact, normal affect.  Memory intact not anxious or depressed appearing.  CNS: CN 2-12 intact, power,  normal throughout.no focal deficits noted.        Assessment & Plan:  Essential hypertension Controlled, no change in medication DASH diet and commitment to daily physical activity for a minimum of 30 minutes discussed and encouraged, as a part of hypertension management. The importance of attaining a healthy weight is also discussed.    GERD Controlled, no change in medication, will change to generic  equivalent    Prediabetes unchanged since 02/2014 Patient educated about the importance of limiting  Carbohydrate intake , the need to commit to daily physical activity for a minimum of 30 minutes , and to commit weight loss. The fact that changes in all these areas will reduce or eliminate all together the development of diabetes is stressed.   D/c metformin as currently on aggressive weight loss program and doing well   LUMP OR MASS IN BREAST mammo past due and will be scheduled   MORBID OBESITY Improved. Pt applauded on succesful weight loss through lifestyle change, and encouraged to continue same. Weight loss goal set for the next several months.

## 2014-07-12 NOTE — Assessment & Plan Note (Signed)
mammo past due and will be scheduled

## 2014-07-12 NOTE — Patient Instructions (Signed)
F/u in 3.5 month, call if you need me before  Thankful that you are [facility] better  STOP zetia  STOP metformin  Pls continue to follow new eating plan and stary exercise 5 days per week (hopefully) in [place] using video tapes when weather prevents the alternative  CONGRATS on weight loss to date, keep it up!  Fating lipid, cmp and HBA1C and HIV next in 3.5 month, approx 5 days before f/u

## 2014-07-12 NOTE — Assessment & Plan Note (Signed)
unchanged since 02/2014 Patient educated about the importance of limiting  Carbohydrate intake , the need to commit to daily physical activity for a minimum of 30 minutes , and to commit weight loss. The fact that changes in all these areas will reduce or eliminate all together the development of diabetes is stressed.   D/c metformin as currently on aggressive weight loss program and doing well

## 2014-07-12 NOTE — Assessment & Plan Note (Signed)
Controlled, no change in medication, will change to generic  equivalent

## 2014-07-12 NOTE — Assessment & Plan Note (Signed)
Improved. Pt applauded on succesful weight loss through lifestyle change, and encouraged to continue same. Weight loss goal set for the next several months.  

## 2014-07-20 ENCOUNTER — Other Ambulatory Visit: Payer: Self-pay

## 2014-07-20 DIAGNOSIS — F411 Generalized anxiety disorder: Secondary | ICD-10-CM

## 2014-07-20 MED ORDER — POTASSIUM CHLORIDE CRYS ER 10 MEQ PO TBCR: EXTENDED_RELEASE_TABLET | ORAL | Status: DC

## 2014-07-20 MED ORDER — VALSARTAN-HYDROCHLOROTHIAZIDE 160-12.5 MG PO TABS: 1.0000 | ORAL_TABLET | Freq: Every day | ORAL | Status: DC

## 2014-07-20 MED ORDER — FLUOXETINE HCL 20 MG PO CAPS: 20.0000 mg | ORAL_CAPSULE | Freq: Every day | ORAL | Status: DC

## 2014-07-25 ENCOUNTER — Encounter: Payer: Self-pay | Admitting: Family Medicine

## 2014-07-25 ENCOUNTER — Ambulatory Visit (INDEPENDENT_AMBULATORY_CARE_PROVIDER_SITE_OTHER): Payer: 59 | Admitting: Family Medicine

## 2014-07-25 ENCOUNTER — Telehealth: Payer: Self-pay

## 2014-07-25 VITALS — BP 110/72 | HR 84 | Temp 98.8°F | Resp 18 | Ht 62.0 in | Wt 228.0 lb

## 2014-07-25 DIAGNOSIS — I10 Essential (primary) hypertension: Secondary | ICD-10-CM

## 2014-07-25 DIAGNOSIS — K5792 Diverticulitis of intestine, part unspecified, without perforation or abscess without bleeding: Secondary | ICD-10-CM

## 2014-07-25 DIAGNOSIS — K5793 Diverticulitis of intestine, part unspecified, without perforation or abscess with bleeding: Secondary | ICD-10-CM

## 2014-07-25 MED ORDER — CIPROFLOXACIN HCL 500 MG PO TABS: 500.0000 mg | ORAL_TABLET | Freq: Two times a day (BID) | ORAL | Status: DC

## 2014-07-25 MED ORDER — HYDROCODONE-ACETAMINOPHEN 5-325 MG PO TABS: 1.0000 | ORAL_TABLET | Freq: Four times a day (QID) | ORAL | Status: DC | PRN

## 2014-07-25 NOTE — Telephone Encounter (Signed)
Spoke with patient and she had chills yesterday 3/7.  She is having abdominal pain as well. Added to the schedule at 1:15

## 2014-07-25 NOTE — Patient Instructions (Signed)
F/u as before .  You are treated for acute diverticulitis.  Pls call if symptoms worsen,, I am hoping that you feel back to normal in the next 48 hrs, and will return to [facility] on 07/26/2024, out from today  Use nausea med you altready have if worsen  Increase intakle as tolerated but mainly clear  liquids today for the first 24 to 48 hrs   Diverticulitis Diverticulitis is when small pockets that have formed in your colon (large intestine) become infected or swollen. HOME CARE  Follow your doctor's instructions.  Follow a special diet if told by your doctor.  When you feel better, your doctor may tell you to change your diet. You may be told to eat a lot of fiber. Fruits and vegetables are good sources of fiber. Fiber makes it easier to poop (have bowel movements).  Take supplements or probiotics as told by your doctor.  Only take medicines as told by your doctor.  Keep all follow-up visits with your doctor. GET HELP IF:  Your pain does not get better.  You have a hard time eating food.  You are not pooping like normal. GET HELP RIGHT AWAY IF:  Your pain gets worse.  Your problems do not get better.  Your problems suddenly get worse.  You have a fever.  You keep throwing up (vomiting).  You have bloody or black, tarry poop (stool). MAKE SURE YOU:   Understand these instructions.  Will watch your condition.  Will get help right away if you are not doing well or get worse. Document Released: 10/22/2007 Document Revised: 05/10/2013 Document Reviewed: 03/30/2013 [facility] Patient Information 2015 [place], [place]. This information is not intended to replace advice given to you by your health care provider. Make sure you discuss any questions you have with your health care provider.

## 2014-07-25 NOTE — Telephone Encounter (Signed)
Is she having fever chills change in stool urine symptoms or anything other than abdominal pain

## 2014-08-01 ENCOUNTER — Telehealth: Payer: Self-pay

## 2014-08-01 ENCOUNTER — Other Ambulatory Visit: Payer: Self-pay | Admitting: Family Medicine

## 2014-08-01 MED ORDER — DEXLANSOPRAZOLE 30 MG PO CPDR: 30.0000 mg | DELAYED_RELEASE_CAPSULE | Freq: Every day | ORAL | Status: DC

## 2014-08-01 NOTE — Telephone Encounter (Signed)
Patient aware and med faxed to [facility]

## 2014-08-01 NOTE — Telephone Encounter (Signed)
Printed script for 30 of lower dose dexilant send to local pharmacy initially to see if this dose is adequate, will not rx high dose if low dose works, she generally gets 90 day mal order I asee, discuss with ehr and send where she wants this sent please

## 2014-08-06 DIAGNOSIS — R0789 Other chest pain: Secondary | ICD-10-CM | POA: Insufficient documentation

## 2014-08-06 NOTE — Assessment & Plan Note (Signed)
Increased and uncontrolled, due to recent family stress, pt to increase fluoxtine dose to 20mg  daily

## 2014-08-06 NOTE — Assessment & Plan Note (Signed)
Increased bloating and distension with dietary change, symptomatic medication as she is on specific eating plan from her weight loss program. He is reassured that her abdominal exam reveals no acute illness

## 2014-08-06 NOTE — Assessment & Plan Note (Signed)
Controlled, no change in medication  

## 2014-08-06 NOTE — Assessment & Plan Note (Signed)
Acute flare, bpowel rest, fluids and 3 day course of levaquin prescribed, also work excuse , pt to call for worsening symptoms, no lab or radiologic data obtained on day of visit

## 2014-08-06 NOTE — Assessment & Plan Note (Signed)
Bowel rest and 3 day course of levaquin also work excuse. No lab or radiologic data on day of visit, pt will call back if worsening symptoms

## 2014-08-06 NOTE — Assessment & Plan Note (Signed)
Deteriorated. Patient re-educated about  the importance of commitment to a  minimum of 150 minutes of exercise per week.  The importance of healthy food choices with portion control discussed. Encouraged to start a food diary, count calories and to consider  joining a support group. Sample diet sheets offered. Goals set by the patient for the next several months.   Wt Readings from Last 3 Encounters:  07/25/14 228 lb (103.42 kg)  07/12/14 230 lb (104.327 kg)  06/19/14 246 lb (111.585 kg)    Body mass index is 44.98 kg/(m^2).  Current exercise per week 120 minutes.

## 2014-08-06 NOTE — Progress Notes (Signed)
   Subjective:    Patient ID: Caitlin Sampson, female    DOB: May 10, 1961, 54 y.o.   MRN: [id number]  HPI 3 day h/o cramping lower abdominal pain with loose stool, unable to work today. No fever, has had chills, no visible blood or mucus in stool No nausea or vomit, "feels like the pan I get with diverticulitis" Doing well with change in diet and weight loss. Mental health has improved with fluoxetine   Review of Systems    See HPI  Denies sinus pressure, nasal congestion, ear pain or sore throat. Denies chest congestion, productive cough or wheezing. Denies chest pains, palpitations and leg swelling .   Denies dysuria, frequency, hesitancy or incontinence.  Denies uncontrolled  depression, anxiety or insomnia.     Objective:   Physical Exam BP 110/72 mmHg  Pulse 84  Temp(Src) 98.8 F (37.1 C)  Resp 18  Ht 5\' 2"  (1.575 m)  Wt 228 lb (103.42 kg)  BMI 41.69 kg/m2  SpO2 92% Patient alert and oriented and in no cardiopulmonary distress.Ill appearing with abdominal pain  HEENT: No facial asymmetry, EOMI,   oropharynx pink and moist.  Neck supple no JVD, no mass.  Chest: Clear to auscultation bilaterally.  CVS: S1, S2 no murmurs, no S3.Regular rate.  ABD: Soft, generalized lower abdominal tenderness, no guarding or rebound, hyperactive BS, no palpable organomegaly or mass Rectal : refused   Ext: No edema  MS: Adequate ROM spine, shoulders, hips and knees.  Skin: Intact, no ulcerations or rash noted.  Psych: Good eye contact, normal affect. Memory intact not anxious or depressed appearing.  CNS: CN 2-12 intact, power,  normal throughout.no focal deficits noted.        Assessment & Plan:

## 2014-08-06 NOTE — Assessment & Plan Note (Signed)
Sub optimal control, no med change pt anxious and visibly distressed at visit.

## 2014-08-06 NOTE — Assessment & Plan Note (Signed)
Improved. Pt applauded on succesful weight loss through lifestyle change, and encouraged to continue same. Weight loss goal set for the next several months.  

## 2014-08-06 NOTE — Assessment & Plan Note (Signed)
EKG shows NSR , with possible inferior infarct, undetermined age, when compared with EKG in [facility] , 2015 by her cardiologist, there is no difference, Will f/u tropnin, i do not believe this is ACS, des[ppite multiple risk factors. Pt has been under increased emotional stress due to family issues and this is likely the main culprit

## 2014-09-29 ENCOUNTER — Encounter: Payer: Self-pay | Admitting: *Deleted

## 2014-10-02 ENCOUNTER — Telehealth: Payer: Self-pay | Admitting: Cardiovascular Disease

## 2014-10-02 NOTE — Telephone Encounter (Signed)
Pt has an appt on 10-10-14 with Dr Claiborne Billings. She had lab with her primary doctor the last of February or the first of March. Will she still need lab work?

## 2014-10-02 NOTE — Telephone Encounter (Signed)
Left message for patient, yes we do need her lab work. Patient asked to bring with her or call to get our fax number.

## 2014-10-10 ENCOUNTER — Ambulatory Visit (INDEPENDENT_AMBULATORY_CARE_PROVIDER_SITE_OTHER): Payer: 59 | Admitting: Cardiovascular Disease

## 2014-10-10 ENCOUNTER — Encounter: Payer: Self-pay | Admitting: Cardiovascular Disease

## 2014-10-10 VITALS — BP 132/88 | HR 75 | Ht 62.0 in | Wt 228.5 lb

## 2014-10-10 DIAGNOSIS — I10 Essential (primary) hypertension: Secondary | ICD-10-CM

## 2014-10-10 DIAGNOSIS — E8881 Metabolic syndrome: Secondary | ICD-10-CM | POA: Diagnosis not present

## 2014-10-10 DIAGNOSIS — E785 Hyperlipidemia, unspecified: Secondary | ICD-10-CM

## 2014-10-10 MED ORDER — PANTOPRAZOLE SODIUM 40 MG PO TBEC: 40.0000 mg | DELAYED_RELEASE_TABLET | Freq: Every day | ORAL | Status: DC

## 2014-10-10 NOTE — Patient Instructions (Addendum)
Your physician has recommended you make the following change in your medication: a prescription for pantoprazole has been sent to your pharmacy. If your insurance will not approve this, then Dr. Claiborne Billings recommends for you to try generic zantac 150 mg over the counter.  Your physician wants you to follow-up in: 1 year or sooner if needed. You will receive a reminder letter in the mail two months in advance. If you don't receive a letter, please call our office to schedule the follow-up appointment.

## 2014-10-10 NOTE — Progress Notes (Signed)
Patient ID: Caitlin Sampson, female   DOB: 26-Jan-1961, 54 y.o.   MRN: [id number]     HPI: Caitlin Sampson is a 54 y.o. female as to the office today for a 7 -month cardiology evaluation.  Caitlin Sampson has a history of hypertension, hyperlipidemia, obesity, and metabolic syndrome/borderline diabetes. There is a strong family history for CAD with her father suffering an initial myocardial infarction at age 38.  She underwent a cardiac catheterization in 2005 for chest pain and was found to have mid LAD intramyocardial segment but did not have obstructive disease.  In the fall of 2013 she developed episodes of chest tightness with mild shortness of breath. A nuclear perfusion study in December 2013 revealed normal perfusion and function with normal wall motion.  She has a history of GERD. She's not been very successful with weight loss.  She has a history of hyperlipidemia in the past has developed myalgias with Lipitor and higher dose statin.  She also has metabolic syndrome and had been started on metformin 500 mg, but since I last saw her is no longer taking this. An NMR profile revealed a LDL particle number  increased at 1995 particles with an LDL calculated at 146. Total cholesterol 236. Small LDL particle number was increased at 705. Insulin resistance who was increased at 53. Had increased VLDL size at 50  When I saw her in February, I recommended a rechallenge of statin therapy.  At that time, she was started on Crestor 10 mg every third day with titration to every other day.  She also takes coenzyme Q10 300 mg.  She has been on valsartan HCT 160/12.5 for her blood pressure.   Laboratory in October 2015 showed improvement such that her total cholesterol is now 183, triglycerides 154, HDL 66, and LDL has reduced from 146 to 86.   Caitlin Sampson denies recent chest pain.  Recently, her insurance company is no longer allowing her to take Nexium.  She has been taking over-the-counter ranitidine but she is  uncertain if she is taking 75 or 150 mg.  She has noticed more GERD symptoms on that whatever dose she is taking.  She has lost 8 pounds since her office visit 8 months ago.  She denies chest pain.  She denies palpitations.  She denies PND, orthopnea.  She did have laboratory done by her primary physician, Dr. Moshe Cipro.  Past Medical History  Diagnosis Date  . Diverticulosis of colon 2012  . Hypertension   . Hyperlipidemia   . Anxiety   . Poison oak dermatitis 2012    Past Surgical History  Procedure Laterality Date  . Cholecystectomy    . Abdominal hysterectomy    . Transthoracic echocardiogram  10/10/08    NORMAL. EF => 55%.  . Myocardial perfusion study  03/30/09    NORMAL. EF 76%.  . Cardiac catheterization  09/05/03    EF 57%. NORMAL CORONARY ARTERIES. NO EVIDENCE OF RENAL ARTERY STENOSIS.    Allergies  Allergen Reactions  . Vytorin [Ezetimibe-Simvastatin] Other (See Comments)    Joint pain    Current Outpatient Prescriptions  Medication Sig Dispense Refill  . ALPRAZolam (XANAX) 0.25 MG tablet One tablet at bedtime, as needed, for anxety 20 tablet 0  . aspirin 81 MG tablet Take 81 mg by mouth daily.      Marland Kitchen FLUoxetine (PROZAC) 20 MG capsule Take 1 capsule (20 mg total) by mouth daily. 90 capsule 1  . loratadine (CLARITIN) 10 MG tablet Take 10  mg by mouth daily as needed.     . potassium chloride (K-DUR,KLOR-CON) 10 MEQ tablet TAKE 1 TABLET (10 MEQ TOTAL)  BY MOUTH  DAILY. 90 tablet 1  . rosuvastatin (CRESTOR) 10 MG tablet Take 1 tablet (10 mg total) by mouth daily. 90 tablet 1  . valsartan-hydrochlorothiazide (DIOVAN HCT) 160-12.5 MG per tablet Take 1 tablet by mouth daily. 90 tablet 1  . pantoprazole (PROTONIX) 40 MG tablet Take 1 tablet (40 mg total) by mouth daily. 90 tablet 1  . [DISCONTINUED] potassium chloride (K-DUR) 10 MEQ tablet Take 1 tablet (10 mEq total) by mouth 2 (two) times daily. 60 tablet 5   No current facility-administered medications for this visit.     History   Social History  . Marital Status: Married    Spouse Name: N/A  . Number of Children: N/A  . Years of Education: N/A   Occupational History  . Not on file.   Social History Main Topics  . Smoking status: Former Research scientist (life sciences)  . Smokeless tobacco: Not on file  . Alcohol Use: Not on file  . Drug Use: Not on file  . Sexual Activity: Not on file   Other Topics Concern  . Not on file   Social History Narrative   Socially she is married and has one child. She quit smoking greater than 17 years ago. She does exercise.  Family History  Problem Relation Age of Onset  . Heart disease Mother     a fib  . Kidney disease Mother   . Heart disease Father     MI  . Hyperlipidemia Father   . Hypertension Father   . Kidney disease Father   . Kidney disease Brother    ROS General: Negative; No fevers, chills, or night sweats; no success with recent weight loss HEENT: Negative; No changes in vision or hearing, sinus congestion, difficulty swallowing Pulmonary: Negative; No cough, wheezing, shortness of breath, hemoptysis Cardiovascular: Negative; No chest pain, presyncope, syncope, palpitations GI: Positive for GERD No nausea, vomiting, diarrhea, or abdominal pain GU: Negative; No dysuria, hematuria, or difficulty voiding Musculoskeletal: Negative; no myalgias, joint pain, or weakness Hematologic/Oncology: Negative; no easy bruising, bleeding Immunologic: Positive for allergies, for which he takes Claritin on an as-needed basis. Endocrine: Negative; no heat/cold intolerance; no diabetes Neuro: Negative; no changes in balance, headaches Skin: Negative; No rashes or skin lesions Psychiatric: Negative; No behavioral problems, depression Sleep: Negative; No snoring, daytime sleepiness, hypersomnolence, bruxism, restless legs, hypnogognic hallucinations, no cataplexy Other comprehensive 14 point system review is negative.   PE BP 132/88 mmHg  Pulse 75  Ht _0  (1.575 m)  Wt  228 lb 8 oz (103.647 kg)  BMI 41.78 kg/m2  Wt Readings from Last 3 Encounters:  10/10/14 228 lb 8 oz (103.647 kg)  07/25/14 228 lb (103.42 kg)  07/12/14 230 lb (104.327 kg)   Repeat blood pressure by me 124/80 General: Alert, oriented, no distress.  Skin: normal turgor, no rashes HEENT: Normocephalic, atraumatic. Pupils round and reactive; sclera anicteric;no lid lag. Extraocular muscles intact. No xanthelasma Nose without nasal septal hypertrophy Mouth/Parynx benign; Mallinpatti scale 3 Neck: No JVD, no carotid bruits; normal carotid upstroke Lungs: clear to ausculatation and percussion; no wheezing or rales Chest wall: no tenderness to palpitation Heart: RRR, s1 s2 normal 1/6 systolic murmur. No S3 gallop. No S4.  No diastolic murmur No thrills or heaves. Abdomen: Moderate central adiposity;soft, nontender; no hepatosplenomehaly, BS+; abdominal aorta nontender and not dilated by palpation. Back: no  CVA tenderness Pulses 2+ Extremities: no clubbing cyanosis or edema, Homan's sign negative  Neurologic: grossly nonfocal; cranial nerves grossly normal. Psychologic: normal affect and mood.  ECG (independently read by me): Normal sinus rhythm at 75.  Poor progression.  No cigarette ST segment changes.  Normal intervals.  October 2015 ECG (independently read by me): Normal sinus rhythm at 78 beats per minute.  PRWP progression.  No significant ST changes.  Prior February 2015 ECG (independently read by me): Sinus rhythm at 80 beats per minute. No significant ST-T changes., Nonspecific  LABS: BMP Latest Ref Rng 07/06/2014 02/21/2014 06/15/2013  Glucose 70 - 99 mg/dL 110(H) 112(H) 107(H)  BUN 6 - 23 mg/dL _0 Creatinine 0.50 - 1.10 mg/dL 0.65 0.70 0.64  Sodium 135 - 145 mEq/L 139 141 138  Potassium 3.5 - 5.3 mEq/L 3.5 4.2 3.7  Chloride 96 - 112 mEq/L 98 101 100  CO2 19 - 32 mEq/L _1 Calcium 8.4 - 10.5 mg/dL 10.1 9.8 9.7   Hepatic Function Latest Ref Rng 07/06/2014 02/21/2014  06/15/2013  Total Protein 6.0 - 8.3 g/dL 6.7 6.7 6.5  Albumin 3.5 - 5.2 g/dL 4.3 4.2 4.2  AST 0 - 37 U/L _2 ALT 0 - 35 U/L _3 Alk Phosphatase 39 - 117 U/L 65 70 63  Total Bilirubin 0.2 - 1.2 mg/dL 0.3 0.3 0.4  Bilirubin, Direct 0.0 - 0.3 mg/dL - - -   CBC Latest Ref Rng 02/21/2014 10/28/2012 12/25/2011  WBC 4.0 - 10.5 K/uL 8.7 8.6 7.4  Hemoglobin 12.0 - 15.0 g/dL 12.4 12.3 12.9  Hematocrit 36.0 - 46.0 % 38.0 36.9 38.5  Platelets 150 - 400 K/uL 358 351 315   Lab Results  Component Value Date   MCV 85.6 02/21/2014   MCV 87.4 10/28/2012   MCV 86.1 12/25/2011   Lab Results  Component Value Date   TSH 2.244 02/21/2014   Lab Results  Component Value Date   HGBA1C 6.3* 07/06/2014   Lipid Panel     Component Value Date/Time   CHOL 113 07/06/2014 0803   CHOL 236* 06/15/2013 0849   TRIG 117 07/06/2014 0803   TRIG 132 06/15/2013 0849   HDL 52 07/06/2014 0803   HDL 64 06/15/2013 0849   CHOLHDL 2.2 07/06/2014 0803   VLDL 23 07/06/2014 0803   LDLCALC 38 07/06/2014 0803   LDLCALC 146* 06/15/2013 0849   RADIOLOGY: No results found.    ASSESSMENT AND PLAN: Caitlin Sampson is a 54 year old female who has a history of morbid obesity with a body mass index of 41.8 kg/m, hypertension, hyperlipidemia, and metabolic syndrome.  Since I last saw her, she has lost 8 pounds.  She has been taken off metformin for metabolic syndrome.  Recent laboratory done by Dr. Moshe Cipro has shown and hemoglobin A1c at 6.3, which is under the overt diabetic range but still in the metabolic syndrome category.  Her blood pressure today is stable on valsartan HCT 160/12.5.  She now is tolerating daily dose of Crestor 10 mg and her most recent lipid status.  Done by Dr. Moshe Cipro has shown a total cholesterol of 113, HDL 52, LDL 38, VLDL 23, and triglycerides 117.  She has normal renal function.  She has experienced issues with GERD which was well controlled when she had taken Nexium.  Her insurance company  will no longer supply this.  I have suggested that if she's currently taking ranitidine at 75 mg  to increase this to 150 mg to see if this improves her symptoms.  If not, I will then write her a prescription for Protonix 40 mg daily and see if the insurance company will allow for this.  If not, she can consider over-the-counter Prilosec.  We again discussed increased exercise and weight loss.  I will see her in one year for reevaluation or sooner if problem arise.  Time spent: 25 minutes   Troy Sine, MD, [facility]  10/10/2014 5:29 PM

## 2014-10-25 ENCOUNTER — Encounter: Payer: Self-pay | Admitting: Cardiovascular Disease

## 2014-12-13 ENCOUNTER — Ambulatory Visit: Payer: 59 | Admitting: Family Medicine

## 2014-12-18 ENCOUNTER — Ambulatory Visit: Payer: 59 | Admitting: Family Medicine

## 2015-01-12 ENCOUNTER — Telehealth: Payer: Self-pay

## 2015-01-14 ENCOUNTER — Other Ambulatory Visit: Payer: Self-pay | Admitting: Family Medicine

## 2015-01-14 DIAGNOSIS — G4452 New daily persistent headache (NDPH): Secondary | ICD-10-CM

## 2015-01-14 NOTE — Telephone Encounter (Signed)
May have rept brain scan, last was in 2013 for headacxhe, I wll order , also I recommend fioricet for headache , I have entered , pls send, stilkl needs to [facility] her 8/31 appt

## 2015-01-14 NOTE — Telephone Encounter (Addendum)
pls advise her to get fasting lipid, cmp and EGFr and hBa1C for her 8/31 appt and order also

## 2015-01-15 NOTE — Telephone Encounter (Signed)
Called and left message for patient to return call.  

## 2015-01-15 NOTE — Telephone Encounter (Signed)
Spoke with patient and she states that she recently had labs done last week.  Will call and get those results.   Patient will discuss imaging with Dr. On 8/31

## 2015-01-17 ENCOUNTER — Encounter: Payer: Self-pay | Admitting: Family Medicine

## 2015-01-17 ENCOUNTER — Other Ambulatory Visit: Payer: Self-pay

## 2015-01-17 ENCOUNTER — Ambulatory Visit (INDEPENDENT_AMBULATORY_CARE_PROVIDER_SITE_OTHER): Payer: 59 | Admitting: Family Medicine

## 2015-01-17 ENCOUNTER — Ambulatory Visit (HOSPITAL_COMMUNITY)
Admission: RE | Admit: 2015-01-17 | Discharge: 2015-01-17 | Disposition: A | Discharge status: Home / Self Care | Payer: Commercial Managed Care - HMO | Source: Ambulatory Visit | Attending: Family Medicine | Admitting: Family Medicine

## 2015-01-17 VITALS — BP 160/94 | HR 83 | Resp 16 | Ht 62.0 in | Wt 234.0 lb

## 2015-01-17 DIAGNOSIS — G4452 New daily persistent headache (NDPH): Secondary | ICD-10-CM

## 2015-01-17 DIAGNOSIS — R7303 Prediabetes: Secondary | ICD-10-CM

## 2015-01-17 DIAGNOSIS — F411 Generalized anxiety disorder: Secondary | ICD-10-CM

## 2015-01-17 DIAGNOSIS — R7309 Other abnormal glucose: Secondary | ICD-10-CM

## 2015-01-17 DIAGNOSIS — E785 Hyperlipidemia, unspecified: Secondary | ICD-10-CM | POA: Diagnosis not present

## 2015-01-17 DIAGNOSIS — R51 Headache: Secondary | ICD-10-CM | POA: Diagnosis not present

## 2015-01-17 DIAGNOSIS — I10 Essential (primary) hypertension: Secondary | ICD-10-CM

## 2015-01-17 MED ORDER — BUTALBITAL-APAP-CAFFEINE 50-325-40 MG PO TABS: ORAL_TABLET | ORAL | Status: DC

## 2015-01-17 NOTE — Patient Instructions (Addendum)
MD follow up in 3 months. You are referred fore a brain scan for new daily headaches  You are referred to headache clinic  BP is slightly  high , no change in medication, it is 140/88 , just need to chsange to plant based diet and work on daily walking for 30 mions and weight loss, also low salt  Labs are  Good, please resume metformin one daily as blood sugar slightly elevated  New for headache is fioricet one daily as needed, 20 tablets expected to last for 3 months, do NOT plan to take every day, anmd they have a sedative side effect, so take only at bedtime  You will get contact info on bariatric surgery       DASH Eating Plan DASH stands for "Dietary Approaches to Stop Hypertension." The DASH eating plan is a healthy eating plan that has been shown to reduce high blood pressure (hypertension). Additional health benefits may include reducing the risk of type 2 diabetes mellitus, heart disease, and stroke. The DASH eating plan may also help with weight loss. WHAT DO I NEED TO KNOW ABOUT THE DASH EATING PLAN? For the DASH eating plan, you will follow these general guidelines:  Choose foods with a percent daily value for sodium of less than 5% (as listed on the food label).  Use salt-free seasonings or herbs instead of table salt or sea salt.  Check with your health care provider or pharmacist before using salt substitutes.  Eat lower-sodium products, often labeled as "lower sodium" or "no salt added."  Eat fresh foods.  Eat more vegetables, fruits, and low-fat dairy products.  Choose whole grains. Look for the word "whole" as the first word in the ingredient list.  Choose fish and skinless chicken or [place] more often than red meat. Limit fish, poultry, and meat to 6 oz (170 g) each day.  Limit sweets, desserts, sugars, and sugary drinks.  Choose heart-healthy fats.  Limit cheese to 1 oz (28 g) per day.  Eat more home-cooked food and less restaurant, buffet, and  fast food.  Limit fried foods.  Cook foods using methods other than frying.  Limit canned vegetables. If you do use them, rinse them well to decrease the sodium.  When eating at a restaurant, ask that your food be prepared with less salt, or no salt if possible. WHAT FOODS CAN I EAT? Seek help from a dietitian for individual calorie needs. Grains Whole grain or whole wheat bread. Brown rice. Whole grain or whole wheat pasta. Quinoa, bulgur, and whole grain cereals. Low-sodium cereals. Corn or whole wheat flour tortillas. Whole grain cornbread. Whole grain crackers. Low-sodium crackers. Vegetables Fresh or frozen vegetables (raw, steamed, roasted, or grilled). Low-sodium or reduced-sodium tomato and vegetable juices. Low-sodium or reduced-sodium tomato sauce and paste. Low-sodium or reduced-sodium canned vegetables.  Fruits All fresh, canned (in natural juice), or frozen fruits. Meat and Other Protein Products Ground beef (85% or leaner), grass-fed beef, or beef trimmed of fat. Skinless chicken or [place]. Ground chicken or [place]. Pork trimmed of fat. All fish and seafood. Eggs. Dried beans, peas, or lentils. Unsalted nuts and seeds. Unsalted canned beans. Dairy Low-fat dairy products, such as skim or 1% milk, 2% or reduced-fat cheeses, low-fat ricotta or cottage cheese, or plain low-fat yogurt. Low-sodium or reduced-sodium cheeses. Fats and Oils Tub margarines without trans fats. Light or reduced-fat mayonnaise and salad dressings (reduced sodium). Avocado. Safflower, olive, or canola oils. Natural peanut or almond butter. Other Unsalted popcorn  and pretzels. The items listed above may not be a complete list of recommended foods or beverages. Contact your dietitian for more options. WHAT FOODS ARE NOT RECOMMENDED? Grains White bread. White pasta. White rice. Refined cornbread. Bagels and croissants. Crackers that contain trans fat. Vegetables Creamed or fried vegetables. Vegetables in a  cheese sauce. Regular canned vegetables. Regular canned tomato sauce and paste. Regular tomato and vegetable juices. Fruits Dried fruits. Canned fruit in light or heavy syrup. Fruit juice. Meat and Other Protein Products Fatty cuts of meat. Ribs, chicken wings, bacon, sausage, bologna, salami, chitterlings, fatback, hot dogs, bratwurst, and packaged luncheon meats. Salted nuts and seeds. Canned beans with salt. Dairy Whole or 2% milk, cream, half-and-half, and cream cheese. Whole-fat or sweetened yogurt. Full-fat cheeses or blue cheese. Nondairy creamers and whipped toppings. Processed cheese, cheese spreads, or cheese curds. Condiments Onion and garlic salt, seasoned salt, table salt, and sea salt. Canned and packaged gravies. Worcestershire sauce. Tartar sauce. Barbecue sauce. Teriyaki sauce. Soy sauce, including reduced sodium. Steak sauce. Fish sauce. Oyster sauce. Cocktail sauce. Horseradish. Ketchup and mustard. Meat flavorings and tenderizers. Bouillon cubes. Hot sauce. Tabasco sauce. Marinades. Taco seasonings. Relishes. Fats and Oils Butter, stick margarine, lard, shortening, ghee, and bacon fat. Coconut, palm kernel, or palm oils. Regular salad dressings. Other Pickles and olives. Salted popcorn and pretzels. The items listed above may not be a complete list of foods and beverages to avoid. Contact your dietitian for more information. WHERE CAN I FIND MORE INFORMATION? National Heart, Lung, and Blood Institute: travelstabloid.com Document Released: 04/24/2011 Document Revised: 09/19/2013 Document Reviewed: 03/09/2013 [facility] Patient Information 2015 [place], [place]. This information is not intended to replace advice given to you by your health care provider. Make sure you discuss any questions you have with your health care provider.

## 2015-01-17 NOTE — Assessment & Plan Note (Addendum)
1 month h/o sharp shooting left sided headaches , has awakened pt , several times  Per day, duration up to 5 mins max pain rated at a 10 Exam is non focal, but needs imaging and also will be referred to headache clinic No known family of brain cancer or brain aneurysm Trial of fioricet for headache relief

## 2015-01-17 NOTE — Progress Notes (Signed)
Multiple attempts were made to pager [phone] to give call report of the MRI Brain wo .  The message says no voice mail has been set up.  I could not relay this call report.  The office had already closed due to it being past 5:00 pm.  SMD [id number]

## 2015-02-06 ENCOUNTER — Other Ambulatory Visit: Payer: Self-pay | Admitting: Family Medicine

## 2015-02-07 ENCOUNTER — Encounter: Payer: Self-pay | Admitting: Family Medicine

## 2015-02-11 NOTE — Assessment & Plan Note (Addendum)
Not at goal, blood pressure elevated at visit, pain and anxiety are no doubt contributing. No med change at thsi visit  DASH diet and commitment to daily physical activity for a minimum of 30 minutes discussed and encouraged, as a part of hypertension management. The importance of attaining a healthy weight is also discussed.  BP/Weight 01/17/2015 10/10/2014 07/25/2014 07/12/2014 06/19/2014 22/09/7503 05/26/3356  Systolic BP 251 898 421 031 281 188 677  Diastolic BP 94 88 72 78 88 88 82  Wt. (Lbs) 234 228.5 228 230 246 236 232  BMI 42.79 41.78 41.69 42.06 44.98 43.15 42.42

## 2015-02-11 NOTE — Assessment & Plan Note (Signed)
Patient educated about the importance of limiting  Carbohydrate intake , the need to commit to daily physical activity for a minimum of 30 minutes , and to commit weight loss. The fact that changes in all these areas will reduce or eliminate all together the development of diabetes is stressed.  Updated lab needed at/ before next visit.   Diabetic Labs Latest Ref Rng 07/06/2014 02/21/2014 08/23/2013 06/15/2013 10/28/2012  HbA1c <5.7 % 6.3(H) 6.3(H) 6.2(H) - 5.8(H)  Chol 0 - 200 mg/dL 113 183 - 236(H) 230(H)  HDL >39 mg/dL 52 66 - 64 77  Calc LDL 0 - 99 mg/dL 38 86 - 146(H) 132(H)  Triglycerides <150 mg/dL 117 154(H) - 132 104  Creatinine 0.50 - 1.10 mg/dL 0.65 0.70 - 0.64 0.61   BP/Weight 01/17/2015 10/10/2014 07/25/2014 07/12/2014 06/19/2014 31/06/8116 12/23/7735  Systolic BP 366 815 947 076 151 834 373  Diastolic BP 94 88 72 78 88 88 82  Wt. (Lbs) 234 228.5 228 230 246 236 232  BMI 42.79 41.78 41.69 42.06 44.98 43.15 42.42   No flowsheet data found.

## 2015-02-11 NOTE — Assessment & Plan Note (Signed)
Hyperlipidemia:Low fat diet discussed and encouraged.   Lipid Panel  Lab Results  Component Value Date   CHOL 113 07/06/2014   HDL 52 07/06/2014   LDLCALC 38 07/06/2014   TRIG 117 07/06/2014   CHOLHDL 2.2 07/06/2014

## 2015-02-11 NOTE — Assessment & Plan Note (Signed)
Deteriorated. Patient re-educated about  the importance of commitment to a  minimum of 150 minutes of exercise per week.  The importance of healthy food choices with portion control discussed. Encouraged to start a food diary, count calories and to consider  joining a support group. Sample diet sheets offered. Goals set by the patient for the next several months.   Weight /BMI 01/17/2015 10/10/2014 07/25/2014  WEIGHT 234 lb 228 lb 8 oz 228 lb  HEIGHT 5\' 2"  5\' 2"  5\' 2"   BMI 42.79 kg/m2 41.78 kg/m2 41.69 kg/m2    Current exercise per week 90 minutes.

## 2015-02-11 NOTE — Progress Notes (Signed)
Caitlin Sampson     MRN: [id number]      DOB: [birth year]   HPI Caitlin Sampson is here with a 1 month h/o daily persistent headache for follow up and re-evaluation of chronic medical conditions, medication management and review of any available recent lab and radiology data.  Preventive health is updated, specifically  Cancer screening and Immunization.    The PT denies any adverse reactions to current medications since the last visit.  1 month history of daily persistent headaches   ROS Denies recent fever or chills. Denies sinus pressure, nasal congestion, ear pain or sore throat. Denies chest congestion, productive cough or wheezing. Denies chest pains, palpitations and leg swelling Denies abdominal pain, nausea, vomiting,diarrhea or constipation.   Denies dysuria, frequency, hesitancy or incontinence. Denies joint pain, swelling and limitation in mobility.  Denies skin break down or rash.   PE  BP 160/94 mmHg  Pulse 83  Resp 16  Ht 5\' 2"  (1.575 m)  Wt 234 lb (106.142 kg)  BMI 42.79 kg/m2  SpO2 96%  Patient alert and oriented and in no cardiopulmonary distress.  HEENT: No facial asymmetry, EOMI,   oropharynx pink and moist.  Neck supple no JVD, no mass.  Chest: Clear to auscultation bilaterally.  CVS: S1, S2 no murmurs, no S3.Regular rate.  ABD: Soft non tender.   Ext: No edema  MS: Adequate ROM spine, shoulders, hips and knees.  Skin: Intact, no ulcerations or rash noted.  Psych: Good eye contact, normal affect. Memory intact mildly anxious not  depressed appearing.  CNS: CN 2-12 intact, power,  normal throughout.no focal deficits noted.   Assessment & Plan   New daily persistent headache 1 month h/o sharp shooting left sided headaches , has awakened pt , several times  Per day, duration up to 5 mins max pain rated at a 10 Exam is non focal, but needs imaging and also will be referred to headache clinic No known family of brain cancer or brain aneurysm Trial  of fioricet for headache relief  Essential hypertension Not at goal, blood pressure elevated at visit, pain and anxiety are no doubt contributing. No med change at thsi visit  DASH diet and commitment to daily physical activity for a minimum of 30 minutes discussed and encouraged, as a part of hypertension management. The importance of attaining a healthy weight is also discussed.  BP/Weight 01/17/2015 10/10/2014 07/25/2014 07/12/2014 06/19/2014 49/11/261 11/24/5883  Systolic BP 027 741 287 867 672 094 709  Diastolic BP 94 88 72 78 88 88 82  Wt. (Lbs) 234 228.5 228 230 246 236 232  BMI 42.79 41.78 41.69 42.06 44.98 43.15 42.42        Hyperlipidemia Hyperlipidemia:Low fat diet discussed and encouraged.   Lipid Panel  Lab Results  Component Value Date   CHOL 113 07/06/2014   HDL 52 07/06/2014   LDLCALC 38 07/06/2014   TRIG 117 07/06/2014   CHOLHDL 2.2 07/06/2014        MORBID OBESITY Deteriorated. Patient re-educated about  the importance of commitment to a  minimum of 150 minutes of exercise per week.  The importance of healthy food choices with portion control discussed. Encouraged to start a food diary, count calories and to consider  joining a support group. Sample diet sheets offered. Goals set by the patient for the next several months.   Weight /BMI 01/17/2015 10/10/2014 07/25/2014  WEIGHT 234 lb 228 lb 8 oz 228 lb  HEIGHT 5\' 2"  5\' 2"   5\' 2"   BMI 42.79 kg/m2 41.78 kg/m2 41.69 kg/m2    Current exercise per week 90 minutes.   Prediabetes Patient educated about the importance of limiting  Carbohydrate intake , the need to commit to daily physical activity for a minimum of 30 minutes , and to commit weight loss. The fact that changes in all these areas will reduce or eliminate all together the development of diabetes is stressed.  Updated lab needed at/ before next visit.   Diabetic Labs Latest Ref Rng 07/06/2014 02/21/2014 08/23/2013 06/15/2013 10/28/2012  HbA1c <5.7 %  6.3(H) 6.3(H) 6.2(H) - 5.8(H)  Chol 0 - 200 mg/dL 113 183 - 236(H) 230(H)  HDL >39 mg/dL 52 66 - 64 77  Calc LDL 0 - 99 mg/dL 38 86 - 146(H) 132(H)  Triglycerides <150 mg/dL 117 154(H) - 132 104  Creatinine 0.50 - 1.10 mg/dL 0.65 0.70 - 0.64 0.61   BP/Weight 01/17/2015 10/10/2014 07/25/2014 07/12/2014 06/19/2014 24/06/3534 05/22/4313  Systolic BP 400 867 619 509 326 712 458  Diastolic BP 94 88 72 78 88 88 82  Wt. (Lbs) 234 228.5 228 230 246 236 232  BMI 42.79 41.78 41.69 42.06 44.98 43.15 42.42   No flowsheet data found.     GAD (generalized anxiety disorder) Increased however responds to current medication

## 2015-02-11 NOTE — Assessment & Plan Note (Signed)
Increased however responds to current medication

## 2015-02-19 ENCOUNTER — Other Ambulatory Visit: Payer: Self-pay | Admitting: Cardiovascular Disease

## 2015-02-19 NOTE — Telephone Encounter (Signed)
Rx(s) sent to pharmacy electronically.  

## 2015-04-25 ENCOUNTER — Ambulatory Visit: Payer: 59 | Admitting: Family Medicine

## 2015-06-26 ENCOUNTER — Other Ambulatory Visit: Payer: Self-pay | Admitting: Family Medicine

## 2015-07-27 ENCOUNTER — Other Ambulatory Visit: Payer: Self-pay | Admitting: Cardiovascular Disease

## 2015-07-27 NOTE — Telephone Encounter (Signed)
Rx(s) sent to pharmacy electronically.  

## 2015-08-01 ENCOUNTER — Other Ambulatory Visit: Payer: Self-pay | Admitting: Family Medicine

## 2015-08-09 ENCOUNTER — Other Ambulatory Visit (HOSPITAL_COMMUNITY)
Admission: RE | Admit: 2015-08-09 | Discharge: 2015-08-09 | Disposition: A | Discharge status: Home / Self Care | Payer: 59 | Source: Ambulatory Visit | Attending: Family Medicine | Admitting: Family Medicine

## 2015-08-09 ENCOUNTER — Other Ambulatory Visit: Payer: Self-pay | Admitting: Family Medicine

## 2015-08-09 ENCOUNTER — Telehealth: Payer: Self-pay | Admitting: Family Medicine

## 2015-08-09 ENCOUNTER — Ambulatory Visit (INDEPENDENT_AMBULATORY_CARE_PROVIDER_SITE_OTHER): Payer: 59

## 2015-08-09 DIAGNOSIS — I10 Essential (primary) hypertension: Secondary | ICD-10-CM

## 2015-08-09 DIAGNOSIS — N3001 Acute cystitis with hematuria: Secondary | ICD-10-CM | POA: Diagnosis not present

## 2015-08-09 DIAGNOSIS — R7303 Prediabetes: Secondary | ICD-10-CM

## 2015-08-09 DIAGNOSIS — Z1159 Encounter for screening for other viral diseases: Secondary | ICD-10-CM

## 2015-08-09 DIAGNOSIS — E785 Hyperlipidemia, unspecified: Secondary | ICD-10-CM

## 2015-08-09 LAB — POCT URINALYSIS DIPSTICK
Bilirubin, UA: NEGATIVE
GLUCOSE UA: NEGATIVE
Ketones, UA: NEGATIVE
NITRITE UA: NEGATIVE
PROTEIN UA: NEGATIVE
Spec Grav, UA: 1.02
UROBILINOGEN UA: 0.2
pH, UA: 6.5

## 2015-08-09 MED ORDER — CIPROFLOXACIN HCL 500 MG PO TABS: 500.0000 mg | ORAL_TABLET | Freq: Two times a day (BID) | ORAL | Status: DC

## 2015-08-09 NOTE — Telephone Encounter (Signed)
Called and left message for patient notifying that she can come in for a nurse visit this afternoon after 1:15

## 2015-08-09 NOTE — Progress Notes (Signed)
Sent for culture and 3 days of cipro given

## 2015-08-09 NOTE — Telephone Encounter (Signed)
Patient is calling stating that she is having trouble urinating, no burning but a lot of pressure and low back pain, please advise?

## 2015-08-10 LAB — CBC WITH DIFFERENTIAL/PLATELET
BASOS ABS: 0 10*3/uL (ref 0.0–0.1)
Basophils Relative: 0 % (ref 0–1)
EOS ABS: 0.1 10*3/uL (ref 0.0–0.7)
Eosinophils Relative: 1 % (ref 0–5)
HEMATOCRIT: 35.7 % — AB (ref 36.0–46.0)
HEMOGLOBIN: 11.4 g/dL — AB (ref 12.0–15.0)
LYMPHS ABS: 2.4 10*3/uL (ref 0.7–4.0)
LYMPHS PCT: 33 % (ref 12–46)
MCH: 27.1 pg (ref 26.0–34.0)
MCHC: 31.9 g/dL (ref 30.0–36.0)
MCV: 85 fL (ref 78.0–100.0)
MONOS PCT: 12 % (ref 3–12)
MPV: 9 fL (ref 8.6–12.4)
Monocytes Absolute: 0.9 10*3/uL (ref 0.1–1.0)
NEUTROS ABS: 3.9 10*3/uL (ref 1.7–7.7)
NEUTROS PCT: 54 % (ref 43–77)
Platelets: 353 10*3/uL (ref 150–400)
RBC: 4.2 MIL/uL (ref 3.87–5.11)
RDW: 14.2 % (ref 11.5–15.5)
WBC: 7.3 10*3/uL (ref 4.0–10.5)

## 2015-08-10 LAB — HEMOGLOBIN A1C
HEMOGLOBIN A1C: 6.3 % — AB (ref <5.7)
MEAN PLASMA GLUCOSE: 134 mg/dL — AB (ref <117)

## 2015-08-10 LAB — HEPATITIS C ANTIBODY: HCV AB: NEGATIVE

## 2015-08-10 LAB — HIV ANTIBODY (ROUTINE TESTING W REFLEX): HIV 1&2 Ab, 4th Generation: NONREACTIVE

## 2015-08-10 LAB — TSH: TSH: 2.15 mIU/L

## 2015-08-11 LAB — LIPID PANEL
CHOLESTEROL: 162 mg/dL (ref 125–200)
HDL: 70 mg/dL (ref >=46)
LDL Cholesterol: 76 mg/dL (ref <130)
TRIGLYCERIDES: 81 mg/dL (ref <150)
Total CHOL/HDL Ratio: 2.3 Ratio (ref <=5.0)
VLDL: 16 mg/dL (ref <30)

## 2015-08-11 LAB — COMPREHENSIVE METABOLIC PANEL
ALBUMIN: 4.1 g/dL (ref 3.6–5.1)
ALK PHOS: 72 U/L (ref 33–130)
ALT: 24 U/L (ref 6–29)
AST: 21 U/L (ref 10–35)
BILIRUBIN TOTAL: 0.3 mg/dL (ref 0.2–1.2)
BUN: 12 mg/dL (ref 7–25)
CALCIUM: 9.9 mg/dL (ref 8.6–10.4)
CO2: 27 mmol/L (ref 20–31)
Chloride: 101 mmol/L (ref 98–110)
Creat: 0.73 mg/dL (ref 0.50–1.05)
GLUCOSE: 113 mg/dL — AB (ref 65–99)
POTASSIUM: 3.8 mmol/L (ref 3.5–5.3)
Sodium: 139 mmol/L (ref 135–146)
TOTAL PROTEIN: 6.7 g/dL (ref 6.1–8.1)

## 2015-08-12 LAB — URINE CULTURE

## 2015-08-13 LAB — FERRITIN: Ferritin: 10 ng/mL (ref 10–232)

## 2015-08-13 LAB — IRON: Iron: 37 ug/dL — ABNORMAL LOW (ref 45–160)

## 2015-08-14 ENCOUNTER — Encounter: Payer: Self-pay | Admitting: Family Medicine

## 2015-08-14 ENCOUNTER — Encounter (INDEPENDENT_AMBULATORY_CARE_PROVIDER_SITE_OTHER): Payer: Self-pay | Admitting: *Deleted

## 2015-08-14 ENCOUNTER — Ambulatory Visit (INDEPENDENT_AMBULATORY_CARE_PROVIDER_SITE_OTHER): Payer: 59 | Admitting: Family Medicine

## 2015-08-14 VITALS — BP 132/84 | HR 94 | Resp 18 | Ht 62.0 in | Wt 249.0 lb

## 2015-08-14 DIAGNOSIS — K648 Other hemorrhoids: Secondary | ICD-10-CM

## 2015-08-14 DIAGNOSIS — D3A026 Benign carcinoid tumor of the rectum: Secondary | ICD-10-CM

## 2015-08-14 DIAGNOSIS — I10 Essential (primary) hypertension: Secondary | ICD-10-CM | POA: Diagnosis not present

## 2015-08-14 DIAGNOSIS — R7303 Prediabetes: Secondary | ICD-10-CM | POA: Diagnosis not present

## 2015-08-14 DIAGNOSIS — F411 Generalized anxiety disorder: Secondary | ICD-10-CM

## 2015-08-14 DIAGNOSIS — E785 Hyperlipidemia, unspecified: Secondary | ICD-10-CM

## 2015-08-14 DIAGNOSIS — K5730 Diverticulosis of large intestine without perforation or abscess without bleeding: Secondary | ICD-10-CM

## 2015-08-14 DIAGNOSIS — K219 Gastro-esophageal reflux disease without esophagitis: Secondary | ICD-10-CM

## 2015-08-14 DIAGNOSIS — D509 Iron deficiency anemia, unspecified: Secondary | ICD-10-CM

## 2015-08-14 MED ORDER — ROSUVASTATIN CALCIUM 10 MG PO TABS: ORAL_TABLET | ORAL | Status: DC

## 2015-08-14 MED ORDER — RANITIDINE HCL 150 MG PO CAPS: 150.0000 mg | ORAL_CAPSULE | Freq: Two times a day (BID) | ORAL | Status: DC

## 2015-08-14 MED ORDER — FLUOXETINE HCL 20 MG PO CAPS: ORAL_CAPSULE | ORAL | Status: DC

## 2015-08-14 MED ORDER — PHENTERMINE HCL 37.5 MG PO TBDP: ORAL_TABLET | ORAL | Status: DC

## 2015-08-14 MED ORDER — VALSARTAN-HYDROCHLOROTHIAZIDE 160-12.5 MG PO TABS: ORAL_TABLET | ORAL | Status: DC

## 2015-08-14 MED ORDER — METFORMIN HCL ER 500 MG PO TB24: 500.0000 mg | ORAL_TABLET | Freq: Every day | ORAL | Status: DC

## 2015-08-14 MED ORDER — POTASSIUM CHLORIDE CRYS ER 10 MEQ PO TBCR: EXTENDED_RELEASE_TABLET | ORAL | Status: DC

## 2015-08-14 NOTE — Patient Instructions (Signed)
F/u in 3.5 month, call if u you need me ssoner  You need to start SLOW IRON ( slow fe) 65 mg one daily, you are anemic and iron deficient, this is over the counter  You are referred to Dr Laural Golden  Start HALF phentermine evry oTHER day at breakfast and change eating habits as we discussed  Metformin one daily and ranitidine are sent to mail order for you  cBC and iron and ferritin and hBA1C non fast in 3.5 months  Call and schedule follow up with your heart  Doc  Thanks for choosing [facility], we consider it a privelige to serve you.

## 2015-08-14 NOTE — Progress Notes (Signed)
Subjective:    Patient ID: Caitlin Sampson, female    DOB: 03-Sep-1960, 55 y.o.   MRN: [id number]  HPI   [name]     MRN: [id number]      DOB: December 02, 1960   HPI [name] is here for follow up and re-evaluation of chronic medical conditions, medication management and review of any available recent lab and radiology data.  Preventive health is updated, specifically  Cancer screening and Immunization.  Needs updated colonoscopy Needs to schedule her annual cardiology f/u The PT denies any adverse reactions to current medications since the last visit.  C/O regained weight, wants to try low dose phentermine to help with appetite suppression, and will call and stop if se develops any adverse s/e or is intolerant of th drug. Also intends to recommit to change in diet and regular exercise. Now asymptomatic and cured from recent UTI  ROS Denies recent fever or chills. Denies sinus pressure, nasal congestion, ear pain or sore throat. Denies chest congestion, productive cough or wheezing. Denies chest pains, palpitations and leg swelling Denies abdominal pain, nausea, vomiting,diarrhea or constipation.   Denies dysuria, frequency, hesitancy or incontinence. Denies joint pain, swelling and limitation in mobility. Denies headaches, seizures, numbness, or tingling. Denies uncontrolled depression, anxiety or insomnia. Denies skin break down or rash.   PE  BP 132/84 mmHg  Pulse 94  Resp 18  Ht 5\' 2"  (1.575 m)  Wt 249 lb (112.946 kg)  BMI 45.53 kg/m2  SpO2 97%  Patient alert and oriented and in no cardiopulmonary distress.  HEENT: No facial asymmetry, EOMI,   oropharynx pink and moist.  Neck supple no JVD, no mass.  Chest: Clear to auscultation bilaterally.  CVS: S1, S2 no murmurs, no S3.Regular rate.  ABD: Soft non tender.   Ext: No edema  MS: Adequate ROM spine, shoulders, hips and knees.  Skin: Intact, no ulcerations or rash noted.  Psych: Good eye contact, normal  affect. Memory intact not anxious or depressed appearing.  CNS: CN 2-12 intact, power,  normal throughout.no focal deficits noted.   Assessment & Plan   MORBID OBESITY Deteriorated.StART half phentermine every other day.Call and stop drug if adverse s/e or intolerance Patient re-educated about  the importance of commitment to a  minimum of 150 minutes of exercise per week.  The importance of healthy food choices with portion control discussed. Encouraged to start a food diary, count calories and to consider  joining a support group. Sample diet sheets offered. Goals set by the patient for the next several months.   Weight /BMI 08/14/2015 01/17/2015 10/10/2014  WEIGHT 249 lb 234 lb 228 lb 8 oz  HEIGHT 5\' 2"  5\' 2"  5\' 2"   BMI 45.53 kg/m2 42.79 kg/m2 41.78 kg/m2    Current exercise per week zero mins   Essential hypertension Controlled, no change in medication DASH diet and commitment to daily physical activity for a minimum of 30 minutes discussed and encouraged, as a part of hypertension management. The importance of attaining a healthy weight is also discussed.  BP/Weight 08/14/2015 01/17/2015 10/10/2014 07/25/2014 07/12/2014 06/19/2014 [id number]  Systolic BP Q000111Q 0000000 Q000111Q [id number] [id number] 0000000 123456  Diastolic BP 84 94 88 72 78 88 88  Wt. (Lbs) 249 234 228.5 228 230 246 236  BMI 45.53 42.79 41.78 41.69 42.06 44.98 43.15        IDA (iron deficiency anemia) Refer to GIU fpor further eval. 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   Lipid Panel  Lab Results  Component Value Date   CHOL 162 08/09/2015   HDL 70 08/09/2015   LDLCALC 76 08/09/2015   TRIG 81 08/09/2015   CHOLHDL 2.3 08/09/2015   Controlled, no change in medication     Prediabetes Patient educated about the importance of limiting  Carbohydrate intake , the need to commit to daily physical  activity for a minimum of 30 minutes , and to commit weight loss. The fact that changes in all these areas will reduce or eliminate all together the development of diabetes is stressed.  [place]  Diabetic Labs Latest Ref Rng 08/09/2015 07/06/2014 02/21/2014 08/23/2013 06/15/2013  HbA1c <5.7 % 6.3(H) 6.3(H) 6.3(H) 6.2(H) -  Chol 125 - 200 mg/dL 162 113 183 - 236(H)  HDL >=46 mg/dL 70 52 66 - 64  Calc LDL <130 mg/dL 76 38 86 - 146(H)  Triglycerides <150 mg/dL 81 117 154(H) - 132  Creatinine 0.50 - 1.05 mg/dL 0.73 0.65 0.70 - 0.64   BP/Weight 08/14/2015 01/17/2015 10/10/2014 07/25/2014 07/12/2014 06/19/2014 [id number]  Systolic BP Q000111Q 0000000 Q000111Q A999333 [id number] 0000000 123456  Diastolic BP 84 94 88 72 78 88 88  Wt. (Lbs) 249 234 228.5 228 230 246 236  BMI 45.53 42.79 41.78 41.69 42.06 44.98 43.15   No flowsheet data found.     GAD (generalized anxiety disorder) Improved and controlled , no med change      Review of Systems     Objective:   Physical Exam        Assessment & Plan:

## 2015-08-14 NOTE — Assessment & Plan Note (Addendum)
Deteriorated.StART half phentermine every other day.Call and stop drug if adverse s/e or intolerance Patient re-educated about  the importance of commitment to a  minimum of 150 minutes of exercise per week.  The importance of healthy food choices with portion control discussed. Encouraged to start a food diary, count calories and to consider  joining a support group. Sample diet sheets offered. Goals set by the patient for the next several months.   Weight /BMI 08/14/2015 01/17/2015 10/10/2014  WEIGHT 249 lb 234 lb 228 lb 8 oz  HEIGHT 5\' 2"  5\' 2"  5\' 2"   BMI 45.53 kg/m2 42.79 kg/m2 41.78 kg/m2    Current exercise per week zero mins

## 2015-08-19 DIAGNOSIS — D3A026 Benign carcinoid tumor of the rectum: Secondary | ICD-10-CM | POA: Insufficient documentation

## 2015-08-19 DIAGNOSIS — K5730 Diverticulosis of large intestine without perforation or abscess without bleeding: Secondary | ICD-10-CM | POA: Insufficient documentation

## 2015-08-19 DIAGNOSIS — K648 Other hemorrhoids: Secondary | ICD-10-CM | POA: Insufficient documentation

## 2015-08-19 NOTE — Assessment & Plan Note (Signed)
Hyperlipidemia:Low fat diet discussed and encouraged.   Lipid Panel  Lab Results  Component Value Date   CHOL 162 08/09/2015   HDL 70 08/09/2015   LDLCALC 76 08/09/2015   TRIG 81 08/09/2015   CHOLHDL 2.3 08/09/2015   Controlled, no change in medication

## 2015-08-19 NOTE — Assessment & Plan Note (Addendum)
Patient educated about the importance of limiting  Carbohydrate intake , the need to commit to daily physical activity for a minimum of 30 minutes , and to commit weight loss. The fact that changes in all these areas will reduce or eliminate all together the development of diabetes is stressed.  Metformin to be sent to help with management of her blood sugar and hopefu;lly also with weight loss  Diabetic Labs Latest Ref Rng 08/09/2015 07/06/2014 02/21/2014 08/23/2013 06/15/2013  HbA1c <5.7 % 6.3(H) 6.3(H) 6.3(H) 6.2(H) -  Chol 125 - 200 mg/dL 162 113 183 - 236(H)  HDL >=46 mg/dL 70 52 66 - 64  Calc LDL <130 mg/dL 76 38 86 - 146(H)  Triglycerides <150 mg/dL 81 117 154(H) - 132  Creatinine 0.50 - 1.05 mg/dL 0.73 0.65 0.70 - 0.64   BP/Weight 08/14/2015 01/17/2015 10/10/2014 07/25/2014 07/12/2014 06/19/2014 [id number]  Systolic BP Q000111Q 0000000 Q000111Q A999333 123XX123 0000000 123456  Diastolic BP 84 94 88 72 78 88 88  Wt. (Lbs) 249 234 228.5 228 230 246 236  BMI 45.53 42.79 41.78 41.69 42.06 44.98 43.15   No flowsheet data found.

## 2015-08-19 NOTE — Assessment & Plan Note (Signed)
Controlled, no change in medication DASH diet and commitment to daily physical activity for a minimum of 30 minutes discussed and encouraged, as a part of hypertension management. The importance of attaining a healthy weight is also discussed.  BP/Weight 08/14/2015 01/17/2015 10/10/2014 07/25/2014 07/12/2014 06/19/2014 [id number]  Systolic BP Q000111Q 0000000 Q000111Q A999333 [id number] 0000000 123456  Diastolic BP 84 94 88 72 78 88 88  Wt. (Lbs) 249 234 228.5 228 230 246 236  BMI 45.53 42.79 41.78 41.69 42.06 44.98 43.15

## 2015-08-19 NOTE — Assessment & Plan Note (Signed)
Improved and controlled, no med change 

## 2015-08-19 NOTE — Assessment & Plan Note (Signed)
Refer to GIU fpor further eval. 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

## 2015-09-05 ENCOUNTER — Ambulatory Visit (INDEPENDENT_AMBULATORY_CARE_PROVIDER_SITE_OTHER): Payer: 59 | Admitting: Internal Medicine

## 2015-09-05 ENCOUNTER — Encounter (INDEPENDENT_AMBULATORY_CARE_PROVIDER_SITE_OTHER): Payer: Self-pay | Admitting: *Deleted

## 2015-09-05 ENCOUNTER — Encounter (INDEPENDENT_AMBULATORY_CARE_PROVIDER_SITE_OTHER): Payer: Self-pay | Admitting: Internal Medicine

## 2015-09-05 ENCOUNTER — Other Ambulatory Visit (INDEPENDENT_AMBULATORY_CARE_PROVIDER_SITE_OTHER): Payer: Self-pay | Admitting: Internal Medicine

## 2015-09-05 VITALS — BP 124/80 | HR 84 | Temp 98.4°F | Ht 62.0 in | Wt 249.2 lb

## 2015-09-05 DIAGNOSIS — D509 Iron deficiency anemia, unspecified: Secondary | ICD-10-CM | POA: Diagnosis not present

## 2015-09-05 NOTE — Progress Notes (Signed)
Subjective:    Patient ID: Caitlin Sampson, female    DOB: Nov 20, 1960, 55 y.o.   MRN: [id number]  HPIReferred by Dr. Moshe Cipro for IDA. H and H in 2015 were normal. She had anemia when she was pregnant. Recent ferritin 08/09/2015 10. Iron 37. She says she has not seen any BRRB or melena. No change in her bowel. She has a BM daily. Her appetite is okay.  No weight loss. She has had some nausea ?  From the iron.  Acid reflux is controlled with Protonix.   No family hx of colon cancer.    12/28/2013 Sigmoidoscopy, surveillance. Indications: small 13mm rectal carcinoid, close but negative margin.  INternal hemorrhoids were found.  Small hemorrhoids.   02/21/2013 Colonoscopy. No active bleeding noted. No colonic neoplasms noted. Scattered diverticula were found in the sigmoid colon. Three polyps were found in the sigmoid colon. Two removed via hotsnare and one via cold biopsy forceps. A nodule was found in the rectum, submucosal, firm ,rectal lesion removed via hot snare. Redundant sigmoid colon.  Biopsy Benign colonic epithelium from all three polyps.  Rectal biopsy: Carcinoid tumor. Approximately 0.4cm in the greates dimension, involving the submucosa, an coming to less than 0.1 mm of the apparent resection margin.  CBC    Component Value Date/Time   WBC 7.3 08/09/2015 0740   RBC 4.20 08/09/2015 0740   HGB 11.4* 08/09/2015 0740   HCT 35.7* 08/09/2015 0740   PLT 353 08/09/2015 0740   MCV 85.0 08/09/2015 0740   MCH 27.1 08/09/2015 0740   MCHC 31.9 08/09/2015 0740   RDW 14.2 08/09/2015 0740   LYMPHSABS 2.4 08/09/2015 0740   MONOABS 0.9 08/09/2015 0740   EOSABS 0.1 08/09/2015 0740   BASOSABS 0.0 08/09/2015 0740    CBC Latest Ref Rng 08/09/2015 02/21/2014 10/28/2012  WBC 4.0 - 10.5 K/uL 7.3 8.7 8.6  Hemoglobin 12.0 - 15.0 g/dL 11.4(L) 12.4 12.3  Hematocrit 36.0 - 46.0 % 35.7(L) 38.0 36.9  Platelets 150 - 400 K/uL 353 358 351        Review of Systems Past Medical History  Diagnosis  Date  . Diverticulosis of colon 2012  . Hypertension   . Hyperlipidemia   . Anxiety   . Poison oak dermatitis 2012  . IDA (iron deficiency anemia)     Past Surgical History  Procedure Laterality Date  . Cholecystectomy    . Abdominal hysterectomy    . Transthoracic echocardiogram  10/10/08    NORMAL. EF => 55%.  . Myocardial perfusion study  03/30/09    NORMAL. EF 76%.  . Cardiac catheterization  09/05/03    EF 57%. NORMAL CORONARY ARTERIES. NO EVIDENCE OF RENAL ARTERY STENOSIS.    Allergies  Allergen Reactions  . Vytorin [Ezetimibe-Simvastatin] Other (See Comments)    Joint pain    Current Outpatient Prescriptions on File Prior to Visit  Medication Sig Dispense Refill  . aspirin 81 MG tablet Take 81 mg by mouth daily.      Marland Kitchen FLUoxetine (PROZAC) 20 MG capsule Take 1 capsule by mouth  daily 90 capsule 0  . loratadine (CLARITIN) 10 MG tablet Take 10 mg by mouth daily as needed.     . metFORMIN (GLUCOPHAGE XR) 500 MG 24 hr tablet Take 1 tablet (500 mg total) by mouth daily with breakfast. 90 tablet 1  . pantoprazole (PROTONIX) 40 MG tablet Take 1 tablet (40 mg total) by mouth daily. PLEASE CONTACT OFFICE FOR ADDITIONAL REFILLS 90 tablet 0  .  potassium chloride (K-DUR,KLOR-CON) 10 MEQ tablet Take 1 tablet by mouth  daily 90 tablet 0  . ranitidine (ZANTAC) 150 MG capsule Take 1 capsule (150 mg total) by mouth 2 (two) times daily. (Patient taking differently: Take 150 mg by mouth at bedtime as needed and may repeat dose one time if needed. ) 180 capsule 1  . rosuvastatin (CRESTOR) 10 MG tablet Take 1 tablet by mouth  daily 90 tablet 0  . valsartan-hydrochlorothiazide (DIOVAN-HCT) 160-12.5 MG tablet Take 1 tablet by mouth  daily 90 tablet 0  . [DISCONTINUED] potassium chloride (K-DUR) 10 MEQ tablet Take 1 tablet (10 mEq total) by mouth 2 (two) times daily. 60 tablet 5   No current facility-administered medications on file prior to visit.        Objective:   Physical Exam Blood  pressure 124/80, pulse 84, temperature 98.4 F (36.9 C), height 5\' 2"  (1.575 m), weight 249 lb 3.2 oz (113.036 kg).  Alert and oriented. Skin warm and dry. Oral mucosa is moist.   . Sclera anicteric, conjunctivae is pink. Thyroid not enlarged. No cervical lymphadenopathy. Lungs clear. Heart regular rate and rhythm.  Abdomen is soft. Bowel sounds are positive. No hepatomegaly. No abdominal masses felt. No tenderness.  No edema to lower extremities.  Stool brown and guaiac negative.     Lot [id number] Ex 9/17    Assessment & Plan:  IDA PUD needs to be ruled out..  If normal, she will need an Given's Capsule. EGD.The risks and benefits such as perforation, bleeding, and infection were reviewed with the patient and is agreeable. Three stool cards home with patient.

## 2015-09-05 NOTE — Patient Instructions (Signed)
EGD. The risks and benefits such as perforation, bleeding, and infection were reviewed with the patient and is agreeable. 

## 2015-09-06 ENCOUNTER — Encounter (INDEPENDENT_AMBULATORY_CARE_PROVIDER_SITE_OTHER): Payer: Self-pay

## 2015-10-05 ENCOUNTER — Telehealth (INDEPENDENT_AMBULATORY_CARE_PROVIDER_SITE_OTHER): Payer: Self-pay | Admitting: *Deleted

## 2015-10-05 NOTE — Telephone Encounter (Signed)
   Diagnosis:    Result(s)   Card 1: Negative:     Card 2:  Negative:   Card 3:Negative:    Completed by: Thomas Hoff , LPN   HEMOCCULT SENSA DEVELOPER: LOT#:  [id number] EXPIRATION DATE: 9-17   HEMOCCULT SENSA CARD:  LOT#:  [id number] EXPIRATION DATE: 07-18   CARD CONTROL RESULTS:  POSITIVE: Positive NEGATIVE: Negative    ADDITIONAL COMMENTS: Patient was called with her results.

## 2015-10-07 ENCOUNTER — Other Ambulatory Visit: Payer: Self-pay | Admitting: Family Medicine

## 2015-10-08 NOTE — Telephone Encounter (Signed)
Hem occults are negative  For EGD next week.

## 2015-10-12 ENCOUNTER — Telehealth: Payer: Self-pay | Admitting: Family Medicine

## 2015-10-12 MED ORDER — AZITHROMYCIN 250 MG PO TABS
ORAL_TABLET | ORAL | Status: DC
Start: 2015-10-12 — End: 2015-11-22

## 2015-10-12 NOTE — Telephone Encounter (Signed)
Patient aware.

## 2015-10-12 NOTE — Telephone Encounter (Signed)
Caitlin Sampson is complaining of a sorethroat, coughing, congested, possible fever, symptoms since 2 1/2 wks, shes asking if something can be called in , Please advise?

## 2015-10-12 NOTE — Telephone Encounter (Signed)
Coughing, unable to produce much phlegm, when she does its green. Possible fever, bodyaches, sore throat, ear pain x 2 weeks. Coughing worse in the am when she wakes up. Can feel mucus in her chest but can't cough it up. Please advise

## 2015-10-12 NOTE — Telephone Encounter (Signed)
pls send z pack x 1 and advise OTC robitussin DM , neds to call to be seen on Tues if worse or go to UC over the weekend

## 2015-10-12 NOTE — Addendum Note (Signed)
Addended by: Eual Fines on: 10/12/2015 10:03 AM   Modules accepted: Orders

## 2015-10-15 ENCOUNTER — Other Ambulatory Visit: Payer: Self-pay | Admitting: Cardiovascular Disease

## 2015-10-17 ENCOUNTER — Ambulatory Visit (HOSPITAL_COMMUNITY)
Admission: RE | Admit: 2015-10-17 | Discharge: 2015-10-17 | Disposition: A | Discharge status: Home / Self Care | Payer: Commercial Managed Care - HMO | Source: Ambulatory Visit | Attending: Internal Medicine | Admitting: Internal Medicine

## 2015-10-17 ENCOUNTER — Encounter (HOSPITAL_COMMUNITY)
Admission: RE | Disposition: A | Discharge status: Home / Self Care | Payer: Self-pay | Source: Ambulatory Visit | Attending: Internal Medicine

## 2015-10-17 ENCOUNTER — Encounter (HOSPITAL_COMMUNITY): Payer: Self-pay | Admitting: *Deleted

## 2015-10-17 DIAGNOSIS — Z7984 Long term (current) use of oral hypoglycemic drugs: Secondary | ICD-10-CM | POA: Insufficient documentation

## 2015-10-17 DIAGNOSIS — Z7982 Long term (current) use of aspirin: Secondary | ICD-10-CM | POA: Diagnosis not present

## 2015-10-17 DIAGNOSIS — K317 Polyp of stomach and duodenum: Secondary | ICD-10-CM

## 2015-10-17 DIAGNOSIS — K219 Gastro-esophageal reflux disease without esophagitis: Secondary | ICD-10-CM | POA: Diagnosis not present

## 2015-10-17 DIAGNOSIS — K31819 Angiodysplasia of stomach and duodenum without bleeding: Secondary | ICD-10-CM

## 2015-10-17 DIAGNOSIS — Z79899 Other long term (current) drug therapy: Secondary | ICD-10-CM | POA: Diagnosis not present

## 2015-10-17 DIAGNOSIS — D509 Iron deficiency anemia, unspecified: Secondary | ICD-10-CM | POA: Insufficient documentation

## 2015-10-17 DIAGNOSIS — F419 Anxiety disorder, unspecified: Secondary | ICD-10-CM | POA: Diagnosis not present

## 2015-10-17 DIAGNOSIS — K449 Diaphragmatic hernia without obstruction or gangrene: Secondary | ICD-10-CM | POA: Insufficient documentation

## 2015-10-17 DIAGNOSIS — Z87891 Personal history of nicotine dependence: Secondary | ICD-10-CM | POA: Diagnosis not present

## 2015-10-17 DIAGNOSIS — I10 Essential (primary) hypertension: Secondary | ICD-10-CM | POA: Insufficient documentation

## 2015-10-17 DIAGNOSIS — E785 Hyperlipidemia, unspecified: Secondary | ICD-10-CM | POA: Diagnosis not present

## 2015-10-17 HISTORY — DX: Gastro-esophageal reflux disease without esophagitis: K21.9

## 2015-10-17 HISTORY — PX: ESOPHAGOGASTRODUODENOSCOPY: SHX5428

## 2015-10-17 LAB — HEMOGLOBIN AND HEMATOCRIT, BLOOD
HCT: 34.2 % — ABNORMAL LOW (ref 36.0–46.0)
HEMOGLOBIN: 11 g/dL — AB (ref 12.0–15.0)

## 2015-10-17 SURGERY — EGD (ESOPHAGOGASTRODUODENOSCOPY)
Anesthesia: Moderate Sedation

## 2015-10-17 MED ORDER — PROMETHAZINE HCL 25 MG/ML IJ SOLN
INTRAMUSCULAR | Status: DC | PRN
  Administered 2015-10-17: 12.5 mg via INTRAVENOUS

## 2015-10-17 MED ORDER — MEPERIDINE HCL 50 MG/ML IJ SOLN
INTRAMUSCULAR | Status: AC
  Filled 2015-10-17: qty 1

## 2015-10-17 MED ORDER — MIDAZOLAM HCL 5 MG/5ML IJ SOLN
INTRAMUSCULAR | Status: DC | PRN
  Administered 2015-10-17 (×3): 2 mg via INTRAVENOUS
  Administered 2015-10-17: 1 mg via INTRAVENOUS
  Administered 2015-10-17: 3 mg via INTRAVENOUS

## 2015-10-17 MED ORDER — MEPERIDINE HCL 50 MG/ML IJ SOLN
INTRAMUSCULAR | Status: DC | PRN
  Administered 2015-10-17 (×2): 25 mg via INTRAVENOUS

## 2015-10-17 MED ORDER — BUTAMBEN-TETRACAINE-BENZOCAINE 2-2-14 % EX AERO
INHALATION_SPRAY | CUTANEOUS | Status: DC | PRN
  Administered 2015-10-17: 2 via TOPICAL

## 2015-10-17 MED ORDER — PROMETHAZINE HCL 25 MG/ML IJ SOLN
INTRAMUSCULAR | Status: AC
  Filled 2015-10-17: qty 1

## 2015-10-17 MED ORDER — MIDAZOLAM HCL 5 MG/5ML IJ SOLN
INTRAMUSCULAR | Status: AC
  Filled 2015-10-17: qty 5

## 2015-10-17 MED ORDER — STERILE WATER FOR IRRIGATION IR SOLN
Status: DC | PRN
  Administered 2015-10-17: 15:00:00

## 2015-10-17 MED ORDER — MIDAZOLAM HCL 5 MG/5ML IJ SOLN
INTRAMUSCULAR | Status: AC
  Filled 2015-10-17: qty 10

## 2015-10-17 MED ORDER — SODIUM CHLORIDE 0.9 % IV SOLN
INTRAVENOUS | Status: DC
  Administered 2015-10-17: 1000 mL via INTRAVENOUS

## 2015-10-17 NOTE — Progress Notes (Signed)
Please excuse Caitlin Sampson from work on Wednesday Oct 17, 2015.  She cannot drive, operate machinery, or sign legal documents.  She may return to work June 2nd, 2017.

## 2015-10-17 NOTE — Discharge Instructions (Signed)
Resume usual medications including iron pill as before. Resume usual diet. No driving for 24 hours. Physician will call with results of H&H and biopsy.       Esophagogastroduodenoscopy, Care After Refer to this sheet in the next few weeks. These instructions provide you with information about caring for yourself after your procedure. Your health care provider may also give you more specific instructions. Your treatment has been planned according to current medical practices, but problems sometimes occur. Call your health care provider if you have any problems or questions after your procedure. WHAT TO EXPECT AFTER THE PROCEDURE After your procedure, it is typical to feel:  Soreness in your throat.  Pain with swallowing.  Sick to your stomach (nauseous).  Bloated.  Dizzy.  Fatigued. HOME CARE INSTRUCTIONS  Do not eat or drink anything until the numbing medicine (local anesthetic) has worn off and your gag reflex has returned. You will know that the local anesthetic has worn off when you can swallow comfortably.  Do not drive or operate machinery until directed by your health care provider.  Take medicines only as directed by your health care provider. SEEK MEDICAL CARE IF:   You cannot stop coughing.  You are not urinating at all or less than usual. SEEK IMMEDIATE MEDICAL CARE IF:  You have difficulty swallowing.  You cannot eat or drink.  You have worsening throat or chest pain.  You have dizziness or lightheadedness or you faint.  You have nausea or vomiting.  You have chills.  You have a fever.  You have severe abdominal pain.  You have black, tarry, or bloody stools.   This information is not intended to replace advice given to you by your health care provider. Make sure you discuss any questions you have with your health care provider.   Document Released: 04/21/2012 Document Revised: 05/26/2014 Document Reviewed: 04/21/2012 Elsevier Interactive  Patient Education 2016 [employer]        Gastrointestinal Arteriovenous Malformations A gastrointestinal arteriovenous malformation is a rare defect of tangled veins and arteries involving the intestine. The defect can occur anywhere in the intestine. The defect creates an abnormal collection of blood vessels that can put pressure on other parts of your body that are close by. It causes blood vessels to expand (dilate) over time and sometimes bleed. Many people with this defect never have any symptoms. This defect is present at birth (congenital). SIGNS AND SYMPTOMS Signs and symptoms often do not appear until you are older and may include:  Blood in your stool.  Black or dark red stools.  Weakness.  Tiredness.  Shortness of breath.  Iron deficiency anemia. DIAGNOSIS  The following imaging exams may be used to help in diagnosis:  X-ray.  CT scan.  Angiogram.  Endoscopy.  Enteroscopy.  Capsule endoscopy. TREATMENT  The goal of treatment is to deal with blood loss and prevent future bleeding. Possible treatments include:  Minimally invasive procedures to stop the bleeding or destroy the affected vessels.  Abdominal surgery to remove the affected section of the digestive system.  Blood transfusion to replace significant blood loss.  Iron supplementation for iron deficiency anemia. HOME CARE INSTRUCTIONS  Take all medicines as directed by your health care provider.  Follow up with your health care provider as directed. SEEK MEDICAL CARE IF:  You notice chronic streaks of blood in your stool.  You have black or deep red stool.  You have signs of anemia such as weakness, fatigue, or shortness of breath.  You notice other new symptoms. SEEK IMMEDIATE MEDICAL CARE IF:  You have heavy bleeding during a bowel movement.   This information is not intended to replace advice given to you by your health care provider. Make sure you discuss any questions you have  with your health care provider.   Document Released: 05/10/2013 Document Reviewed: 05/10/2013 Elsevier Interactive Patient Education [employer].

## 2015-10-17 NOTE — H&P (Signed)
Caitlin Sampson is an 55 y.o. female.   Chief Complaint: Patient is here for EGD. HPI: Patient is 55 year old Caucasian female who was recently discovered to have iron deficiency anemia. She denies melena or rectal bleeding. 3 Hemoccults were negative. 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 She had follow-up sigmoidoscopy in August 2015 at [facility](Dr. Truddie Hidden) and there was no recurrence. She takes Advil on as-needed basis no more than a few doses month for headache. She denies in one treatment weight loss or chronic diarrhea. Family History is negative for CRC or celiac disease.  Past Medical History  Diagnosis Date  . Diverticulosis of colon 2012  . Hypertension   . Hyperlipidemia   . Anxiety   . Poison oak dermatitis 2012  . IDA (iron deficiency anemia)   . GERD (gastroesophageal reflux disease)   . Headache     Past Surgical History  Procedure Laterality Date  . Cholecystectomy    . Abdominal hysterectomy    . Transthoracic echocardiogram  10/10/08    NORMAL. EF => 55%.  . Myocardial perfusion study  03/30/09    NORMAL. EF 76%.  . Cardiac catheterization  09/05/03    EF 57%. NORMAL CORONARY ARTERIES. NO EVIDENCE OF RENAL ARTERY STENOSIS.    Family History  Problem Relation Age of Onset  . Heart disease Mother     a fib  . Kidney disease Mother   . Heart disease Father     MI  . Hyperlipidemia Father   . Hypertension Father   . Kidney disease Father   . Kidney disease Brother   . Diabetes Maternal Grandmother   . Stroke Maternal Grandmother    Social History:  reports that she has quit smoking. She does not have any smokeless tobacco history on file. She reports that she does not drink alcohol or use illicit drugs.  Allergies:  Allergies  Allergen Reactions  . Vytorin [Ezetimibe-Simvastatin] Other (See Comments)    Joint pain    Medications Prior to Admission  Medication Sig Dispense Refill  . aspirin 81 MG tablet  Take 81 mg by mouth daily.      . cetirizine (ZYRTEC) 10 MG tablet Take 10 mg by mouth as needed for allergies.    . Ferrous Sulfate Dried (SLOW IRON PO) Take 1 tablet by mouth daily.     Marland Kitchen FLUoxetine (PROZAC) 20 MG capsule Take 1 capsule by mouth  daily 90 capsule 0  . loratadine (CLARITIN) 10 MG tablet Take 10 mg by mouth daily as needed.     . metFORMIN (GLUCOPHAGE XR) 500 MG 24 hr tablet Take 1 tablet (500 mg total) by mouth daily with breakfast. 90 tablet 1  . pantoprazole (PROTONIX) 40 MG tablet TAKE 1 TABLET BY MOUTH  DAILY 90 tablet 1  . potassium chloride (K-DUR,KLOR-CON) 10 MEQ tablet Take 1 tablet by mouth  daily 90 tablet 1  . rosuvastatin (CRESTOR) 10 MG tablet Take 1 tablet by mouth  daily 90 tablet 0  . valsartan-hydrochlorothiazide (DIOVAN-HCT) 160-12.5 MG tablet Take 1 tablet by mouth  daily 90 tablet 0  . azithromycin (ZITHROMAX) 250 MG tablet Use as directed 6 tablet 0    No results found for this or any previous visit (from the past 48 hour(s)). No results found.  ROS  Blood pressure 151/75, pulse 94, temperature 97.9 F (36.6 C), temperature source Oral, resp. rate 14, height 5\' 2"  (1.575 m), weight 245 lb (  111.131 kg), SpO2 96 %. Physical Exam  Constitutional: She appears well-developed and well-nourished.  HENT:  Mouth/Throat: Oropharynx is clear and moist.  Eyes: Conjunctivae are normal. No scleral icterus.  Neck: No thyromegaly present.  Cardiovascular: Normal rate, regular rhythm and normal heart sounds.   No murmur heard. Respiratory: Effort normal and breath sounds normal.  GI: Soft. She exhibits no distension and no mass. There is no tenderness.  Musculoskeletal: She exhibits no edema.  Lymphadenopathy:    She has no cervical adenopathy.  Neurological: She is alert.  Skin: Skin is warm and dry.     Assessment/Plan Iron deficiency anemia. Chronic GERD. Diagnostic EGD.  Hildred Laser, MD 10/17/2015, 2:38 PM

## 2015-10-17 NOTE — Op Note (Signed)
[facility] Patient Name: Caitlin Sampson Procedure Date: 10/17/2015 2:29 PM MRN: [id number] Date of Birth: [birth year] Attending MD: Hildred Laser , MD CSN: [id number] Age: 55 Admit Type: Outpatient Procedure:                Upper GI endoscopy Indications:              Unexplained iron deficiency anemia Providers:                Hildred Laser, MD, Janeece Riggers, RN, Randa Spike,                            Technician Referring MD:             Norwood Levo. Moshe Cipro, MD Medicines:                Cetacaine spray, Promethazine 12.5 mg IV,                            Meperidine 50 mg IV, Midazolam 12 mg IV Complications:            No immediate complications. Estimated Blood Loss:     Estimated blood loss was minimal. Procedure:                Pre-Anesthesia Assessment:                           - Prior to the procedure, a History and Physical                            was performed, and patient medications and                            allergies were reviewed. The patient's tolerance of                            previous anesthesia was also reviewed. The risks                            and benefits of the procedure and the sedation                            options and risks were discussed with the patient.                            All questions were answered, and informed consent                            was obtained. Prior Anticoagulants: The patient                            last took aspirin 2 days prior to the procedure.                            ASA Grade Assessment: II - A patient with mild  systemic disease. After reviewing the risks and                            benefits, the patient was deemed in satisfactory                            condition to undergo the procedure.                           After obtaining informed consent, the endoscope was                            passed under direct vision. Throughout the   procedure, the patient's blood pressure, pulse, and                            oxygen saturations were monitored continuously. The                            [id number]) scope was introduced through the                            mouth, and advanced to the second part of duodenum.                            The upper GI endoscopy was accomplished without                            difficulty. The patient tolerated the procedure                            well. Scope In: 2:55:21 PM Scope Out: 3:02:54 PM Total Procedure Duration: 0 hours 7 minutes 33 seconds  Findings:      The examined esophagus was normal.      The Z-line was regular and was found 35 cm from the incisors.      A 2 cm hiatal hernia was present.      A few small sessile polyps with no bleeding and no stigmata of recent       bleeding were found in the gastric body.      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      The exam of the stomach was otherwise normal.      The duodenal bulb and second portion of the duodenum were normal.       Biopsies for histology were taken with a cold forceps for evaluation of       celiac disease.      The hypopharynx was normal. Impression:               - Normal esophagus.                           - Z-line regular, 35 cm from the incisors.                           -  2 cm hiatal hernia.                           - A few gastric polyps.                           - Gastric antral vascular ectasia without bleeding.                           - Normal duodenal bulb and second portion of the                            duodenum. Biopsied.                           - Normal hypopharynx.                           Comments: GAVE is very mild without stigmata of                            bleed.Doubt that it irce ofpatient's iron                            deficiency anemia.                           Iron deficiency anemiamost likely  secondary                            toimpaired iron absorption in the setting of                            chronic PPI therapy.                           Need for further evill depend on her clinical course Moderate Sedation:      Moderate (conscious) sedation was administered by the endoscopy nurse       and supervised by the endoscopist. The following parameters were       monitored: oxygen saturation, heart rate, blood pressure, CO2       capnography and response to care. Total physician intraservice time was       20 minutes. Recommendation:           - Patient has a contact number available for                            emergencies. The signs and symptoms of potential                            delayed complications were discussed with the                            patient. Return to normal activities tomorrow.  Written discharge instructions were provided to the                            patient.                           - Resume previous diet today.                           - Continue present medications.                           - Check hemoglobin and hematocrit today.                           - Await pathology results. Procedure Code(s):        --- Professional ---                           [phone], Esophagogastroduodenoscopy, flexible,                            transoral; with biopsy, single or multiple                           [id number], Moderate sedation services provided by the                            same physician or other qualified health care                            professional performing the diagnostic or                            therapeutic service that the sedation supports,                            requiring the presence of an independent trained                            observer to assist in the monitoring of the                            patient's level of consciousness and physiological                             status; initial 15 minutes of intraservice time,                            patient age 45 years or older Diagnosis Code(s):        --- Professional ---                           K44.9, Diaphragmatic hernia without obstruction or  gangrene                           K31.7, Polyp of stomach and duodenum                           K31.819, Angiodysplasia of stomach and duodenum                            without bleeding                           D50.9, Iron deficiency anemia, unspecified CPT copyright 2016 [employer]. All rights reserved. The codes documented in this report are preliminary and upon coder review may  be revised to meet current compliance requirements. Hildred Laser, MD Hildred Laser, MD 10/17/2015 3:18:34 PM This report has been signed electronically. Number of Addenda: 0

## 2015-10-22 ENCOUNTER — Encounter (HOSPITAL_COMMUNITY): Payer: Self-pay | Admitting: Internal Medicine

## 2015-10-23 ENCOUNTER — Telehealth (INDEPENDENT_AMBULATORY_CARE_PROVIDER_SITE_OTHER): Payer: Self-pay | Admitting: Internal Medicine

## 2015-10-23 DIAGNOSIS — D509 Iron deficiency anemia, unspecified: Secondary | ICD-10-CM

## 2015-10-23 NOTE — Telephone Encounter (Signed)
Patient called, would like her lab and biopsy results.  [phone] (w) [phone] (c)

## 2015-10-23 NOTE — Telephone Encounter (Addendum)
Dr.Rehman was made aware. Forwarded to him , for documentation. Dr.Rehman had talked with the patient. H&H in 1 month.

## 2015-10-24 NOTE — Telephone Encounter (Signed)
I talked with patient yesterday

## 2015-11-03 ENCOUNTER — Other Ambulatory Visit: Payer: Self-pay | Admitting: Family Medicine

## 2015-11-08 ENCOUNTER — Other Ambulatory Visit (INDEPENDENT_AMBULATORY_CARE_PROVIDER_SITE_OTHER): Payer: Self-pay | Admitting: *Deleted

## 2015-11-08 ENCOUNTER — Encounter (INDEPENDENT_AMBULATORY_CARE_PROVIDER_SITE_OTHER): Payer: Self-pay | Admitting: *Deleted

## 2015-11-08 DIAGNOSIS — D509 Iron deficiency anemia, unspecified: Secondary | ICD-10-CM

## 2015-11-19 LAB — HEMOGLOBIN A1C
HEMOGLOBIN A1C: 6.7 % — AB (ref <5.7)
MEAN PLASMA GLUCOSE: 146 mg/dL

## 2015-11-19 LAB — CBC
HEMATOCRIT: 36.2 % (ref 35.0–45.0)
HEMOGLOBIN: 11.8 g/dL (ref 11.7–15.5)
MCH: 27.4 pg (ref 27.0–33.0)
MCHC: 32.6 g/dL (ref 32.0–36.0)
MCV: 84.2 fL (ref 80.0–100.0)
MPV: 8.6 fL (ref 7.5–12.5)
Platelets: 369 10*3/uL (ref 140–400)
RBC: 4.3 MIL/uL (ref 3.80–5.10)
RDW: 16.2 % — AB (ref 11.0–15.0)
WBC: 8.8 10*3/uL (ref 3.8–10.8)

## 2015-11-19 LAB — FERRITIN: Ferritin: 19 ng/mL (ref 10–232)

## 2015-11-19 LAB — IRON: IRON: 56 ug/dL (ref 45–160)

## 2015-11-22 ENCOUNTER — Ambulatory Visit (INDEPENDENT_AMBULATORY_CARE_PROVIDER_SITE_OTHER): Payer: Commercial Managed Care - HMO | Admitting: Family Medicine

## 2015-11-22 ENCOUNTER — Encounter: Payer: Self-pay | Admitting: Family Medicine

## 2015-11-22 VITALS — BP 146/82 | HR 82 | Resp 18 | Ht 62.0 in | Wt 254.0 lb

## 2015-11-22 DIAGNOSIS — D509 Iron deficiency anemia, unspecified: Secondary | ICD-10-CM

## 2015-11-22 DIAGNOSIS — I10 Essential (primary) hypertension: Secondary | ICD-10-CM | POA: Diagnosis not present

## 2015-11-22 DIAGNOSIS — J3089 Other allergic rhinitis: Secondary | ICD-10-CM

## 2015-11-22 DIAGNOSIS — E8881 Metabolic syndrome: Secondary | ICD-10-CM

## 2015-11-22 DIAGNOSIS — J309 Allergic rhinitis, unspecified: Secondary | ICD-10-CM | POA: Insufficient documentation

## 2015-11-22 DIAGNOSIS — R7303 Prediabetes: Secondary | ICD-10-CM

## 2015-11-22 DIAGNOSIS — E785 Hyperlipidemia, unspecified: Secondary | ICD-10-CM

## 2015-11-22 DIAGNOSIS — K219 Gastro-esophageal reflux disease without esophagitis: Secondary | ICD-10-CM

## 2015-11-22 DIAGNOSIS — F411 Generalized anxiety disorder: Secondary | ICD-10-CM | POA: Diagnosis not present

## 2015-11-22 MED ORDER — PANTOPRAZOLE SODIUM 40 MG PO TBEC: 40.0000 mg | DELAYED_RELEASE_TABLET | Freq: Every day | ORAL | Status: DC

## 2015-11-22 MED ORDER — METFORMIN HCL 500 MG PO TABS: 500.0000 mg | ORAL_TABLET | Freq: Two times a day (BID) | ORAL | Status: DC

## 2015-11-22 MED ORDER — VALSARTAN-HYDROCHLOROTHIAZIDE 160-25 MG PO TABS: 1.0000 | ORAL_TABLET | Freq: Every day | ORAL | Status: DC

## 2015-11-22 MED ORDER — AZELASTINE HCL 0.1 % NA SOLN: 2.0000 | Freq: Two times a day (BID) | NASAL | Status: DC

## 2015-11-22 MED ORDER — FLUOXETINE HCL 40 MG PO CAPS: 40.0000 mg | ORAL_CAPSULE | Freq: Every day | ORAL | Status: DC

## 2015-11-22 NOTE — Assessment & Plan Note (Addendum)
UnControlled, increase in medication dose, and re check in 5 weeks DASH diet and commitment to daily physical activity for a minimum of 30 minutes discussed and encouraged, as a part of hypertension management. The importance of attaining a healthy weight is also discussed.  BP/Weight 11/22/2015 10/17/2015 09/05/2015 08/14/2015 01/17/2015 [id number]  Systolic BP 123456 Q000111Q A999333 Q000111Q 0000000 Q000111Q A999333  Diastolic BP 82 85 80 84 94 88 72  Wt. (Lbs) 254 245 249.2 249 234 228.5 228  BMI 46.45 44.8 45.57 45.53 42.79 41.78 41.69

## 2015-11-22 NOTE — Assessment & Plan Note (Addendum)
Uncontrolled, increase fluoxetine dose to 40 mg daily, and re educated re behavioral techniques to reduce stress

## 2015-11-22 NOTE — Assessment & Plan Note (Signed)
Deteriorated. Caitlin Sampson is reminded of the importance of commitment to daily physical activity for 30 minutes or more, as able and the need to limit carbohydrate intake to 30 to 60 grams per meal to help with blood sugar control.   The need to take medication as prescribed, test blood sugar as directed, and to call between visits if there is a concern that blood sugar is uncontrolled is also discussed.   Caitlin Sampson is reminded of the importance of daily foot exam, annual eye examination, and good blood sugar, blood pressure and cholesterol control.  Diabetic Labs Latest Ref Rng 11/19/2015 08/09/2015 07/06/2014 02/21/2014 08/23/2013  HbA1c <5.7 % 6.7(H) 6.3(H) 6.3(H) 6.3(H) 6.2(H)  Chol 125 - 200 mg/dL - 162 113 183 -  HDL >=46 mg/dL - 70 52 66 -  Calc LDL <130 mg/dL - 76 38 86 -  Triglycerides <150 mg/dL - 81 117 154(H) -  Creatinine 0.50 - 1.05 mg/dL - 0.73 0.65 0.70 -   BP/Weight 11/22/2015 10/17/2015 09/05/2015 08/14/2015 01/17/2015 [id number]  Systolic BP 123456 Q000111Q A999333 Q000111Q 0000000 Q000111Q A999333  Diastolic BP 82 85 80 84 94 88 72  Wt. (Lbs) 254 245 249.2 249 234 228.5 228  BMI 46.45 44.8 45.57 45.53 42.79 41.78 41.69   No flowsheet data found.

## 2015-11-22 NOTE — Patient Instructions (Signed)
Nurse BP check in 5 weeks  MD follow up in 4 months  INCREASE dose of blood pressure medication, start as soon as you get this   INCREASE fluoxetine to 40 mg daily, OK to take TWO 20 mg tablets daily till you get the new dose  New for DAILY use for allergies is astelin nasal spray, use once daily initially and may increase to the twice daily dose after 2 weks if needed, continue zyrtec, half at night   INCREASE metformin to one twice daily   PLEASE set small goals for behavioral change to "clean eating" and follow through, this DOES work  HBA1C, fasting luipid, cmp and eGFr, cBC, iron and ferritin in 4 months

## 2015-11-24 NOTE — Assessment & Plan Note (Signed)
Hyperlipidemia:Low fat diet discussed and encouraged.   Lipid Panel  Lab Results  Component Value Date   CHOL 162 08/09/2015   HDL 70 08/09/2015   LDLCALC 76 08/09/2015   TRIG 81 08/09/2015   CHOLHDL 2.3 08/09/2015   Controlled, no change in medication

## 2015-11-24 NOTE — Assessment & Plan Note (Signed)
Uncontrolled with ear discomfort, start daily astellin

## 2015-11-24 NOTE — Progress Notes (Signed)
Caitlin Sampson     MRN: [id number]      DOB: 01-10-1961   HPI Caitlin Sampson is here for follow up and re-evaluation of chronic medical conditions, medication management and review of any available recent lab and radiology data.  Preventive health is updated, specifically  Cancer screening and Immunization.   Questions or concerns regarding consultations or procedures which the PT has had in the interim are  addressed. The PT denies any adverse reactions to current medications since the last visit.  C/o ear discomfort and pressure, stopped daily bedtime zyrtec , does not think ist is effective, but does note symptoms worse off medication Denies fever and chills. Feels overwhelmed and unable to complete tasks, also very concerned about continued weight gain despite a strong desire to    ROS Denies recent fever or chills. Denies sinus pressure, nasal congestion, ear pain or sore throat. Denies chest congestion, productive cough or wheezing. Denies chest pains, palpitations and leg swelling Denies abdominal pain, nausea, vomiting,diarrhea or constipation.   Denies dysuria, frequency, hesitancy or incontinence. Denies joint pain, swelling and limitation in mobility. Denies headaches, seizures, numbness, or tingling. Denies depression,does have increased anxiety, denies  insomnia. Denies skin break down or rash.   PE  BP 146/82 mmHg  Pulse 82  Resp 18  Ht 5\' 2"  (1.575 m)  Wt 254 lb (115.214 kg)  BMI 46.45 kg/m2  SpO2 95%  Patient alert and oriented and in no cardiopulmonary distress.  HEENT: No facial asymmetry, EOMI,   oropharynx pink and moist.  Neck supple no JVD, no mass.  Chest: Clear to auscultation bilaterally.  CVS: S1, S2 no murmurs, no S3.Regular rate.  ABD: Soft non tender.   Ext: No edema  MS: Adequate ROM spine, shoulders, hips and knees.  Skin: Intact, no ulcerations or rash noted.  Psych: Good eye contact, normal affect. Memory intact not anxious or depressed  appearing.  CNS: CN 2-12 intact, power,  normal throughout.no focal deficits noted.   Assessment & Plan  Essential hypertension UnControlled, increase in medication dose, and re check in 5 weeks DASH diet and commitment to daily physical activity for a minimum of 30 minutes discussed and encouraged, as a part of hypertension management. The importance of attaining a healthy weight is also discussed.  BP/Weight 11/22/2015 10/17/2015 09/05/2015 08/14/2015 01/17/2015 [id number]  Systolic BP 123456 Q000111Q A999333 Q000111Q 0000000 Q000111Q A999333  Diastolic BP 82 85 80 84 94 88 72  Wt. (Lbs) 254 245 249.2 249 234 228.5 228  BMI 46.45 44.8 45.57 45.53 42.79 41.78 41.69        Prediabetes Deteriorated. Caitlin Sampson is reminded of the importance of commitment to daily physical activity for 30 minutes or more, as able and the need to limit carbohydrate intake to 30 to 60 grams per meal to help with blood sugar control.   The need to take medication as prescribed, test blood sugar as directed, and to call between visits if there is a concern that blood sugar is uncontrolled is also discussed.   Caitlin Sampson is reminded of the importance of daily foot exam, annual eye examination, and good blood sugar, blood pressure and cholesterol control.  Diabetic Labs Latest Ref Rng 11/19/2015 08/09/2015 07/06/2014 02/21/2014 08/23/2013  HbA1c <5.7 % 6.7(H) 6.3(H) 6.3(H) 6.3(H) 6.2(H)  Chol 125 - 200 mg/dL - 162 113 183 -  HDL >=46 mg/dL - 70 52 66 -  Calc LDL <130 mg/dL - 76 38 86 -  Triglycerides <  150 mg/dL - 81 117 154(H) -  Creatinine 0.50 - 1.05 mg/dL - 0.73 0.65 0.70 -   BP/Weight 11/22/2015 10/17/2015 09/05/2015 08/14/2015 01/17/2015 [id number]  Systolic BP 123456 Q000111Q A999333 Q000111Q 0000000 Q000111Q A999333  Diastolic BP 82 85 80 84 94 88 72  Wt. (Lbs) 254 245 249.2 249 234 228.5 228  BMI 46.45 44.8 45.57 45.53 42.79 41.78 41.69   No flowsheet data found.       GAD (generalized anxiety disorder) Uncontrolled, increase fluoxetine dose  to 40 mg daily, and re educated re behavioral techniques to reduce stress  IDA (iron deficiency anemia) Improved with supplement, continue same  Metabolic syndrome X The increased risk of cardiovascular disease associated with this diagnosis, and the need to consistently work on lifestyle to change this is discussed. Following  a  heart healthy diet ,commitment to 30 minutes of exercise at least 5 days per week, as well as control of blood sugar and cholesterol , and achieving a healthy weight are all the areas to be addressed .   MORBID OBESITY Deteriorated. Patient re-educated about  the importance of commitment to a  minimum of 150 minutes of exercise per week.  The importance of healthy food choices with portion control discussed. Encouraged to start a food diary, count calories and to consider  joining a support group. Sample diet sheets offered. Goals set by the patient for the next several months.   Weight /BMI 11/22/2015 10/17/2015 09/05/2015  WEIGHT 254 lb 245 lb 249 lb 3.2 oz  HEIGHT 5\' 2"  5\' 2"  5\' 2"   BMI 46.45 kg/m2 44.8 kg/m2 45.57 kg/m2       Hyperlipidemia Hyperlipidemia:Low fat diet discussed and encouraged.   Lipid Panel  Lab Results  Component Value Date   CHOL 162 08/09/2015   HDL 70 08/09/2015   LDLCALC 76 08/09/2015   TRIG 81 08/09/2015   CHOLHDL 2.3 08/09/2015   Controlled, no change in medication      Allergic rhinitis Uncontrolled with ear discomfort, start daily astellin

## 2015-11-24 NOTE — Assessment & Plan Note (Signed)
The increased risk of cardiovascular disease associated with this diagnosis, and the need to consistently work on lifestyle to change this is discussed. Following  a  heart healthy diet ,commitment to 30 minutes of exercise at least 5 days per week, as well as control of blood sugar and cholesterol , and achieving a healthy weight are all the areas to be addressed .  

## 2015-11-24 NOTE — Assessment & Plan Note (Signed)
Improved with supplement, continue same

## 2015-11-24 NOTE — Assessment & Plan Note (Signed)
Deteriorated. Patient re-educated about  the importance of commitment to a  minimum of 150 minutes of exercise per week.  The importance of healthy food choices with portion control discussed. Encouraged to start a food diary, count calories and to consider  joining a support group. Sample diet sheets offered. Goals set by the patient for the next several months.   Weight /BMI 11/22/2015 10/17/2015 09/05/2015  WEIGHT 254 lb 245 lb 249 lb 3.2 oz  HEIGHT 5\' 2"  5\' 2"  5\' 2"   BMI 46.45 kg/m2 44.8 kg/m2 45.57 kg/m2

## 2015-12-27 ENCOUNTER — Ambulatory Visit: Payer: Commercial Managed Care - HMO

## 2015-12-27 VITALS — BP 122/84

## 2015-12-27 DIAGNOSIS — I10 Essential (primary) hypertension: Secondary | ICD-10-CM

## 2015-12-27 NOTE — Progress Notes (Signed)
BP within the normal range. Advised to continue current meds and keep next appt

## 2016-01-03 ENCOUNTER — Encounter (INDEPENDENT_AMBULATORY_CARE_PROVIDER_SITE_OTHER): Payer: Self-pay

## 2016-01-03 ENCOUNTER — Ambulatory Visit: Payer: Commercial Managed Care - HMO | Admitting: Family Medicine

## 2016-01-03 ENCOUNTER — Telehealth: Payer: Self-pay | Admitting: Internal Medicine

## 2016-01-03 ENCOUNTER — Ambulatory Visit (HOSPITAL_COMMUNITY)
Admission: RE | Admit: 2016-01-03 | Discharge: 2016-01-03 | Disposition: A | Discharge status: Home / Self Care | Payer: Commercial Managed Care - HMO | Source: Ambulatory Visit | Attending: Internal Medicine | Admitting: Internal Medicine

## 2016-01-03 ENCOUNTER — Encounter (HOSPITAL_COMMUNITY): Payer: Self-pay

## 2016-01-03 ENCOUNTER — Ambulatory Visit (INDEPENDENT_AMBULATORY_CARE_PROVIDER_SITE_OTHER): Payer: Commercial Managed Care - HMO | Admitting: Internal Medicine

## 2016-01-03 ENCOUNTER — Encounter (INDEPENDENT_AMBULATORY_CARE_PROVIDER_SITE_OTHER): Payer: Self-pay | Admitting: Internal Medicine

## 2016-01-03 VITALS — BP 126/86 | HR 76 | Temp 98.2°F | Ht 62.0 in | Wt 244.8 lb

## 2016-01-03 DIAGNOSIS — K5792 Diverticulitis of intestine, part unspecified, without perforation or abscess without bleeding: Secondary | ICD-10-CM | POA: Diagnosis not present

## 2016-01-03 DIAGNOSIS — I700 Atherosclerosis of aorta: Secondary | ICD-10-CM | POA: Insufficient documentation

## 2016-01-03 DIAGNOSIS — R1032 Left lower quadrant pain: Secondary | ICD-10-CM | POA: Diagnosis not present

## 2016-01-03 DIAGNOSIS — K760 Fatty (change of) liver, not elsewhere classified: Secondary | ICD-10-CM | POA: Insufficient documentation

## 2016-01-03 LAB — POCT I-STAT CREATININE: CREATININE: 0.6 mg/dL (ref 0.44–1.00)

## 2016-01-03 MED ORDER — IOPAMIDOL (ISOVUE-300) INJECTION 61%
100.0000 mL | Freq: Once | INTRAVENOUS | Status: AC | PRN
  Administered 2016-01-03: 100 mL via INTRAVENOUS

## 2016-01-03 MED ORDER — DIATRIZOATE MEGLUMINE & SODIUM 66-10 % PO SOLN
ORAL | Status: AC
  Filled 2016-01-03: qty 30

## 2016-01-03 MED ORDER — CIPROFLOXACIN HCL 500 MG PO TABS: 500.0000 mg | ORAL_TABLET | Freq: Two times a day (BID) | ORAL | 0 refills | Status: DC

## 2016-01-03 MED ORDER — METRONIDAZOLE 250 MG PO TABS: 250.0000 mg | ORAL_TABLET | Freq: Three times a day (TID) | ORAL | 0 refills | Status: DC

## 2016-01-03 NOTE — Telephone Encounter (Signed)
Metronidazole rx was for 250 mg tid; not500mg 

## 2016-01-03 NOTE — Telephone Encounter (Signed)
Pt  Has Acute uncomplicated sigmoid diverticulitis on ct today.  Pt called because no RX sent in to [facility].  I called in cipro 500 mg bid x 10 days and metronidazole 500mg  TID x 10 days; I advised cl liquids over next 24 hours or so until feeling better; f/u with Allen Norris.

## 2016-01-03 NOTE — Patient Instructions (Signed)
CT abdomen/pelvis with CM. UA. Will call when I have results.

## 2016-01-03 NOTE — Progress Notes (Signed)
Subjective:    Patient ID: Caitlin Sampson, female    DOB: 06/09/1960, 55 y.o.   MRN: [id number]  HPIPresents today with c/o mid back pain. She had nausea and chills.  She has pain LLQ. She has had the pain x 2 days. Hx of kidney stone and diverticulitis.  Rates the pain LLQ 8/10 when it hurts. She says right now she is not hurting.  She has some constipation. When she urinates, it is a small amt. Her urine is clear. She says she has had diverticulitis in 2012. She has had chills, maybe a low grade fever. She appears uncomfortable. She has been taking 3-4 Advil every 3-4 hrs for the pain.   10/17/2015 EGD: IDA  Impression:               - Normal esophagus.                           - Z-line regular, 35 cm from the incisors.                           - 2 cm hiatal hernia.                           - A few gastric polyps.                           - Gastric antral vascular ectasia without bleeding.                           - Normal duodenal bulb and second portion of the                            duodenum. Biopsied.                           - Normal hypopharynx.  Small bowel biopsy negative for celiac disease. Iron deficiency anemia may be secondary to impaired iron absorption due to chronic PPI therapy. Will check H&H in one month while patient is taking by mouth iron. Report to Dr. Moshe Cipro.   12/28/2013 Sigmoidoscopy, surveillance. Indications: small 97mm rectal carcinoid, close but negative margin.  INternal hemorrhoids were found.  Small hemorrhoids.   02/21/2013 Colonoscopy. No active bleeding noted. No colonic neoplasms noted. Scattered diverticula were found in the sigmoid colon. Three polyps were found in the sigmoid colon. Two removed via hotsnare and one via cold biopsy forceps. A nodule was found in the rectum, submucosal, firm ,rectal lesion removed via hot snare. Redundant sigmoid colon.  Biopsy Benign colonic epithelium from all three polyps.  Rectal biopsy: Carcinoid  tumor. Approximately 0.4cm in the greates dimension, involving the submucosa, an coming to less than 0.1 mm of the apparent resection margin.    Review of Systems Past Medical History:  Diagnosis Date  . Anxiety   . Diverticulosis of colon 2012  . GERD (gastroesophageal reflux disease)   . Headache   . Hyperlipidemia   . Hypertension   . IDA (iron deficiency anemia)   . Poison oak dermatitis 2012    Past Surgical History:  Procedure Laterality Date  . ABDOMINAL HYSTERECTOMY    . CARDIAC CATHETERIZATION  09/05/03   EF 57%.  NORMAL CORONARY ARTERIES. NO EVIDENCE OF RENAL ARTERY STENOSIS.  Marland Kitchen CHOLECYSTECTOMY    . ESOPHAGOGASTRODUODENOSCOPY N/A 10/17/2015   Procedure: ESOPHAGOGASTRODUODENOSCOPY (EGD);  Surgeon: Rogene Houston, MD;  Location: [facility];  Service: Endoscopy;  Laterality: N/A;  3:00  . MYOCARDIAL PERFUSION STUDY  03/30/09   NORMAL. EF 76%.  . TRANSTHORACIC ECHOCARDIOGRAM  10/10/08   NORMAL. EF => 55%.    Allergies  Allergen Reactions  . Vytorin [Ezetimibe-Simvastatin] Other (See Comments)    Joint pain    Current Outpatient Prescriptions on File Prior to Visit  Medication Sig Dispense Refill  . aspirin 81 MG tablet Take 81 mg by mouth daily.      Marland Kitchen azelastine (ASTELIN) 0.1 % nasal spray Place 2 sprays into both nostrils 2 (two) times daily. Use in each nostril as directed 90 mL 3  . cetirizine (ZYRTEC) 10 MG tablet Take 10 mg by mouth as needed for allergies.    . Ferrous Sulfate Dried (SLOW IRON PO) Take 1 tablet by mouth daily.     Marland Kitchen FLUoxetine (PROZAC) 40 MG capsule Take 1 capsule (40 mg total) by mouth daily. 90 capsule 3  . metFORMIN (GLUCOPHAGE) 500 MG tablet Take 1 tablet (500 mg total) by mouth 2 (two) times daily with a meal. 180 tablet 3  . pantoprazole (PROTONIX) 40 MG tablet Take 1 tablet (40 mg total) by mouth daily. 90 tablet 3  . potassium chloride (K-DUR,KLOR-CON) 10 MEQ tablet Take 1 tablet by mouth  daily 90 tablet 1  . rosuvastatin (CRESTOR)  10 MG tablet Take 1 tablet by mouth  daily 90 tablet 1  . valsartan-hydrochlorothiazide (DIOVAN HCT) 160-25 MG tablet Take 1 tablet by mouth daily. 90 tablet 1  . [DISCONTINUED] potassium chloride (K-DUR) 10 MEQ tablet Take 1 tablet (10 mEq total) by mouth 2 (two) times daily. 60 tablet 5   No current facility-administered medications on file prior to visit.        Objective:   Physical Exam Blood pressure 126/86, pulse 76, temperature 98.2 F (36.8 C), height 5\' 2"  (1.575 m), weight 244 lb 12.8 oz (111 kg).  Alert and oriented. Skin warm and dry. Oral mucosa is moist.   . Sclera anicteric, conjunctivae is pink. Thyroid not enlarged. No cervical lymphadenopathy. Lungs clear. Heart regular rate and rhythm.  Abdomen is soft. Bowel sounds are positive. No hepatomegaly. No abdominal masses felt. Tenderness LLQ and left flank.  No edema to lower extremities.        Assessment & Plan:  LLQ pain. ? diverticulitis vs UTI. Will get a CT scan abdomen/pelvis and a urinalysis Will cover with Cipro and Flagyl x 10 days. Rx for Hydrocodone 5-325mg  written and given to her  #30. No refills.

## 2016-01-04 LAB — URINALYSIS
Bilirubin Urine: NEGATIVE
Glucose, UA: NEGATIVE
HGB URINE DIPSTICK: NEGATIVE
KETONES UR: NEGATIVE
LEUKOCYTES UA: NEGATIVE
NITRITE: NEGATIVE
PROTEIN: NEGATIVE
Specific Gravity, Urine: 1.027 (ref 1.001–1.035)
pH: 6 (ref 5.0–8.0)

## 2016-01-05 NOTE — Telephone Encounter (Signed)
Patient will need follow-up within 2 weeks unless she already has an appointment.

## 2016-01-16 ENCOUNTER — Telehealth (INDEPENDENT_AMBULATORY_CARE_PROVIDER_SITE_OTHER): Payer: Self-pay | Admitting: Internal Medicine

## 2016-01-16 NOTE — Telephone Encounter (Signed)
Patient returned Terri's call, she would really like to speak to you.  [phone]

## 2016-01-17 NOTE — Telephone Encounter (Signed)
She feels better. She says her back is hurting. She will call with a PR tomorrow

## 2016-02-05 ENCOUNTER — Other Ambulatory Visit (INDEPENDENT_AMBULATORY_CARE_PROVIDER_SITE_OTHER): Payer: Self-pay | Admitting: Internal Medicine

## 2016-02-05 ENCOUNTER — Other Ambulatory Visit: Payer: Self-pay | Admitting: Family Medicine

## 2016-02-05 DIAGNOSIS — R1032 Left lower quadrant pain: Secondary | ICD-10-CM

## 2016-02-06 ENCOUNTER — Telehealth (INDEPENDENT_AMBULATORY_CARE_PROVIDER_SITE_OTHER): Payer: Self-pay | Admitting: *Deleted

## 2016-02-06 NOTE — Telephone Encounter (Signed)
This morning there were 2 request for refills . One for Cipro and the other Flagyl. We checked with the patient first to make sure she was not having a flare. She has returned the call and left the message that she was Not having a flare and that she had not requested refills on these two medications. She is not having a flare.  When we went in to refuse request both were missing from the refill request. We do not see that have been approved.

## 2016-03-24 ENCOUNTER — Ambulatory Visit: Payer: Commercial Managed Care - HMO | Admitting: Family Medicine

## 2016-04-14 LAB — CBC
HCT: 37.9 % (ref 35.0–45.0)
Hemoglobin: 12.1 g/dL (ref 11.7–15.5)
MCH: 27.6 pg (ref 27.0–33.0)
MCHC: 31.9 g/dL — ABNORMAL LOW (ref 32.0–36.0)
MCV: 86.3 fL (ref 80.0–100.0)
MPV: 8.5 fL (ref 7.5–12.5)
PLATELETS: 326 10*3/uL (ref 140–400)
RBC: 4.39 MIL/uL (ref 3.80–5.10)
RDW: 15.3 % — AB (ref 11.0–15.0)
WBC: 6.6 10*3/uL (ref 3.8–10.8)

## 2016-04-14 LAB — HEMOGLOBIN A1C
Hgb A1c MFr Bld: 6 % — ABNORMAL HIGH (ref <5.7)
MEAN PLASMA GLUCOSE: 126 mg/dL

## 2016-04-15 LAB — COMPLETE METABOLIC PANEL WITH GFR
ALT: 22 U/L (ref 6–29)
AST: 21 U/L (ref 10–35)
Albumin: 4.2 g/dL (ref 3.6–5.1)
Alkaline Phosphatase: 67 U/L (ref 33–130)
BUN: 11 mg/dL (ref 7–25)
CHLORIDE: 101 mmol/L (ref 98–110)
CO2: 29 mmol/L (ref 20–31)
Calcium: 10 mg/dL (ref 8.6–10.4)
Creat: 0.67 mg/dL (ref 0.50–1.05)
GLUCOSE: 109 mg/dL — AB (ref 65–99)
Potassium: 3.3 mmol/L — ABNORMAL LOW (ref 3.5–5.3)
SODIUM: 140 mmol/L (ref 135–146)
TOTAL PROTEIN: 6.5 g/dL (ref 6.1–8.1)
Total Bilirubin: 0.4 mg/dL (ref 0.2–1.2)

## 2016-04-15 LAB — LIPID PANEL
CHOLESTEROL: 155 mg/dL (ref <200)
HDL: 70 mg/dL (ref >50)
LDL Cholesterol: 59 mg/dL (ref <100)
TRIGLYCERIDES: 131 mg/dL (ref <150)
Total CHOL/HDL Ratio: 2.2 Ratio (ref <5.0)
VLDL: 26 mg/dL (ref <30)

## 2016-04-15 LAB — FERRITIN: FERRITIN: 13 ng/mL (ref 10–232)

## 2016-04-15 LAB — IRON: IRON: 81 ug/dL (ref 45–160)

## 2016-04-16 ENCOUNTER — Encounter: Payer: Self-pay | Admitting: Family Medicine

## 2016-04-16 ENCOUNTER — Ambulatory Visit (INDEPENDENT_AMBULATORY_CARE_PROVIDER_SITE_OTHER): Payer: Commercial Managed Care - HMO | Admitting: Family Medicine

## 2016-04-16 VITALS — BP 120/82 | HR 93 | Resp 16 | Ht 62.0 in | Wt 242.0 lb

## 2016-04-16 DIAGNOSIS — K219 Gastro-esophageal reflux disease without esophagitis: Secondary | ICD-10-CM | POA: Diagnosis not present

## 2016-04-16 DIAGNOSIS — F411 Generalized anxiety disorder: Secondary | ICD-10-CM

## 2016-04-16 DIAGNOSIS — R350 Frequency of micturition: Secondary | ICD-10-CM | POA: Diagnosis not present

## 2016-04-16 DIAGNOSIS — I10 Essential (primary) hypertension: Secondary | ICD-10-CM | POA: Diagnosis not present

## 2016-04-16 DIAGNOSIS — E8881 Metabolic syndrome: Secondary | ICD-10-CM

## 2016-04-16 DIAGNOSIS — R8290 Unspecified abnormal findings in urine: Secondary | ICD-10-CM

## 2016-04-16 DIAGNOSIS — R7303 Prediabetes: Secondary | ICD-10-CM

## 2016-04-16 DIAGNOSIS — E7849 Other hyperlipidemia: Secondary | ICD-10-CM

## 2016-04-16 DIAGNOSIS — E784 Other hyperlipidemia: Secondary | ICD-10-CM

## 2016-04-16 LAB — POCT URINALYSIS DIPSTICK
Bilirubin, UA: NEGATIVE
GLUCOSE UA: NEGATIVE
Ketones, UA: NEGATIVE
Leukocytes, UA: NEGATIVE
NITRITE UA: NEGATIVE
PH UA: 7.5
PROTEIN UA: NEGATIVE
SPEC GRAV UA: 1.02
UROBILINOGEN UA: 0.2

## 2016-04-16 MED ORDER — ROSUVASTATIN CALCIUM 10 MG PO TABS: 10.0000 mg | ORAL_TABLET | Freq: Every day | ORAL | 0 refills | Status: DC

## 2016-04-16 MED ORDER — VALSARTAN-HYDROCHLOROTHIAZIDE 160-25 MG PO TABS: 1.0000 | ORAL_TABLET | Freq: Every day | ORAL | 1 refills | Status: DC

## 2016-04-16 MED ORDER — POTASSIUM CHLORIDE CRYS ER 10 MEQ PO TBCR: 10.0000 meq | EXTENDED_RELEASE_TABLET | Freq: Every day | ORAL | 1 refills | Status: DC

## 2016-04-16 MED ORDER — METFORMIN HCL 500 MG PO TABS: 500.0000 mg | ORAL_TABLET | Freq: Every day | ORAL | 3 refills | Status: DC

## 2016-04-16 MED ORDER — PANTOPRAZOLE SODIUM 40 MG PO TBEC: 40.0000 mg | DELAYED_RELEASE_TABLET | Freq: Every day | ORAL | 1 refills | Status: DC

## 2016-04-16 MED ORDER — FLUOXETINE HCL 40 MG PO CAPS: 40.0000 mg | ORAL_CAPSULE | Freq: Every day | ORAL | 1 refills | Status: DC

## 2016-04-16 NOTE — Patient Instructions (Addendum)
F/u in 4.5 months, call if  You need me before  CONGRATS on improved health  Fadting Lipid, cmp and eGFr, hBa1C in 4.5 months  Urine is being sent for c/s though look OK, except for trace blood, call for urology appt when ready for incomplete emptying  Crestor will be handed to you the script for local fill if able, call back if   Labs will be printed.  Pls buy and slowly read book reversing diabetes, cwith clean green eating   Thanks for choosing [facility], we consider it a privelige to serve you. Please work on good  health habits so that your health will improve. 1. Commitment to daily physical activity for 30 to 60  minutes, if you are able to do this.  2. Commitment to wise food choices. Aim for half of your  food intake to be vegetable and fruit, one quarter starchy foods, and one quarter protein. Try to eat on a regular schedule  3 meals per day, snacking between meals should be limited to vegetables or fruits or small portions of nuts. 64 ounces of water per day is generally recommended, unless you have specific health conditions, like heart failure or kidney failure where you will need to limit fluid intake.  3. Commitment to sufficient and a  good quality of physical and mental rest daily, generally between 6 to 8 hours per day.  WITH PERSISTANCE AND PERSEVERANCE, THE IMPOSSIBLE , BECOMES THE NORM!

## 2016-04-17 ENCOUNTER — Other Ambulatory Visit (HOSPITAL_COMMUNITY)
Admission: RE | Admit: 2016-04-17 | Discharge: 2016-04-17 | Disposition: A | Discharge status: Home / Self Care | Payer: Commercial Managed Care - HMO | Source: Other Acute Inpatient Hospital | Attending: Family Medicine | Admitting: Family Medicine

## 2016-04-17 DIAGNOSIS — R8290 Unspecified abnormal findings in urine: Secondary | ICD-10-CM | POA: Insufficient documentation

## 2016-04-18 LAB — URINE CULTURE

## 2016-04-20 ENCOUNTER — Encounter: Payer: Self-pay | Admitting: Family Medicine

## 2016-04-20 DIAGNOSIS — R350 Frequency of micturition: Secondary | ICD-10-CM | POA: Insufficient documentation

## 2016-04-20 NOTE — Assessment & Plan Note (Signed)
Improved Patient educated about the importance of limiting  Carbohydrate intake , the need to commit to daily physical activity for a minimum of 30 minutes , and to commit weight loss. The fact that changes in all these areas will reduce or eliminate all together the development of diabetes is stressed.   Diabetic Labs Latest Ref Rng & Units 04/14/2016 01/03/2016 11/19/2015 08/09/2015 07/06/2014  HbA1c <5.7 % 6.0(H) - 6.7(H) 6.3(H) 6.3(H)  Chol <200 mg/dL 155 - - 162 113  HDL >50 mg/dL 70 - - 70 52  Calc LDL <100 mg/dL 59 - - 76 38  Triglycerides <150 mg/dL 131 - - 81 117  Creatinine 0.50 - 1.05 mg/dL 0.67 0.60 - 0.73 0.65   BP/Weight 04/16/2016 01/03/2016 12/27/2015 11/22/2015 10/17/2015 09/05/2015 123456  Systolic BP 123456 123XX123 123XX123 123456 Q000111Q [id number] Q000111Q  Diastolic BP 82 86 84 82 85 80 84  Wt. (Lbs) 242 244.8 - 254 245 249.2 249  BMI 44.26 44.77 - 46.45 44.8 45.57 45.53   No flowsheet data found.

## 2016-04-20 NOTE — Assessment & Plan Note (Signed)
Hyperlipidemia:Low fat diet discussed and encouraged.   Lipid Panel  Lab Results  Component Value Date   CHOL 155 04/14/2016   HDL 70 04/14/2016   LDLCALC 59 04/14/2016   TRIG 131 04/14/2016   CHOLHDL 2.2 04/14/2016   Controlled, no change in medication

## 2016-04-20 NOTE — Assessment & Plan Note (Signed)
The increased risk of cardiovascular disease associated with this diagnosis, and the need to consistently work on lifestyle to change this is discussed. Following  a  heart healthy diet ,commitment to 30 minutes of exercise at least 5 days per week, as well as control of blood sugar and cholesterol , and achieving a healthy weight are all the areas to be addressed .  

## 2016-04-20 NOTE — Assessment & Plan Note (Signed)
Improved Patient re-educated about  the importance of commitment to a  minimum of 150 minutes of exercise per week.  The importance of healthy food choices with portion control discussed. Encouraged to start a food diary, count calories and to consider  joining a support group. Sample diet sheets offered. Goals set by the patient for the next several months.   Weight /BMI 04/16/2016 01/03/2016 11/22/2015  WEIGHT 242 lb 244 lb 12.8 oz 254 lb  HEIGHT 5\' 2"  5\' 2"  5\' 2"   BMI 44.26 kg/m2 44.77 kg/m2 46.45 kg/m2

## 2016-04-20 NOTE — Assessment & Plan Note (Signed)
Controlled, no change in medication  

## 2016-04-20 NOTE — Assessment & Plan Note (Signed)
Controlled, no change in medication DASH diet and commitment to daily physical activity for a minimum of 30 minutes discussed and encouraged, as a part of hypertension management. The importance of attaining a healthy weight is also discussed.  BP/Weight 04/16/2016 01/03/2016 12/27/2015 11/22/2015 10/17/2015 09/05/2015 123456  Systolic BP 123456 123XX123 123XX123 123456 Q000111Q [id number] Q000111Q  Diastolic BP 82 86 84 82 85 80 84  Wt. (Lbs) 242 244.8 - 254 245 249.2 249  BMI 44.26 44.77 - 46.45 44.8 45.57 45.53

## 2016-04-20 NOTE — Assessment & Plan Note (Signed)
UA positive for hematuria , otherwise , normal, will send for c/s

## 2016-04-20 NOTE — Progress Notes (Signed)
Caitlin Sampson     MRN: [id number]      DOB: 04-28-1961   HPI Caitlin Sampson is here for follow up and re-evaluation of chronic medical conditions, medication management and review of any available recent lab and radiology data.  Preventive health is updated, specifically  Cancer screening and Immunization.   Questions or concerns regarding consultations or procedures which the PT has had in the interim are  addressed. The PT denies any adverse reactions to current medications since the last visit.  There are no new concerns.  There are no specific complaints   ROS Denies recent fever or chills. Denies sinus pressure, nasal congestion, ear pain or sore throat. Denies chest congestion, productive cough or wheezing. Denies chest pains, palpitations and leg swelling Denies abdominal pain, nausea, vomiting,diarrhea or constipation.   C/o dysuria and frequency , occasional flank pain, denies fever or chills, reports poor urinary stream, waiting on this as she waits for spouse's health to improve Denies joint pain, swelling and limitation in mobility. Denies headaches, seizures, numbness, or tingling. Denies uncontrolled depression, anxiety or insomnia. Denies skin break down or rash.   PE  BP 120/82   Pulse 93   Resp 16   Ht 5\' 2"  (1.575 m)   Wt 242 lb (109.8 kg)   SpO2 99%   BMI 44.26 kg/m   Patient alert and oriented and in no cardiopulmonary distress.  HEENT: No facial asymmetry, EOMI,   oropharynx pink and moist.  Neck supple no JVD, no mass.  Chest: Clear to auscultation bilaterally.  CVS: S1, S2 no murmurs, no S3.Regular rate.  ABD: Soft non tender.   Ext: No edema  MS: Adequate ROM spine, shoulders, hips and knees.  Skin: Intact, no ulcerations or rash noted.  Psych: Good eye contact, normal affect. Memory intact not anxious or depressed appearing.  CNS: CN 2-12 intact, power,  normal throughout.no focal deficits noted.   Assessment & Plan  Essential  hypertension Controlled, no change in medication DASH diet and commitment to daily physical activity for a minimum of 30 minutes discussed and encouraged, as a part of hypertension management. The importance of attaining a healthy weight is also discussed.  BP/Weight 04/16/2016 01/03/2016 12/27/2015 11/22/2015 10/17/2015 09/05/2015 123456  Systolic BP 123456 123XX123 123XX123 123456 Q000111Q A999333 Q000111Q  Diastolic BP 82 86 84 82 85 80 84  Wt. (Lbs) 242 244.8 - 254 245 249.2 249  BMI 44.26 44.77 - 46.45 44.8 45.57 45.53       Hyperlipidemia Hyperlipidemia:Low fat diet discussed and encouraged.   Lipid Panel  Lab Results  Component Value Date   CHOL 155 04/14/2016   HDL 70 04/14/2016   LDLCALC 59 04/14/2016   TRIG 131 04/14/2016   CHOLHDL 2.2 04/14/2016   Controlled, no change in medication     GERD Controlled, no change in medication   Urinary frequency UA positive for hematuria , otherwise , normal, will send for c/s  MORBID OBESITY Improved Patient re-educated about  the importance of commitment to a  minimum of 150 minutes of exercise per week.  The importance of healthy food choices with portion control discussed. Encouraged to start a food diary, count calories and to consider  joining a support group. Sample diet sheets offered. Goals set by the patient for the next several months.   Weight /BMI 04/16/2016 01/03/2016 11/22/2015  WEIGHT 242 lb 244 lb 12.8 oz 254 lb  HEIGHT 5\' 2"  5\' 2"  5\' 2"   BMI 44.26 kg/m2  44.77 kg/m2 46.45 kg/m2      Metabolic syndrome X The increased risk of cardiovascular disease associated with this diagnosis, and the need to consistently work on lifestyle to change this is discussed. Following  a  heart healthy diet ,commitment to 30 minutes of exercise at least 5 days per week, as well as control of blood sugar and cholesterol , and achieving a healthy weight are all the areas to be addressed .   GAD (generalized anxiety disorder) Controlled, no change in  medication   Prediabetes Improved Patient educated about the importance of limiting  Carbohydrate intake , the need to commit to daily physical activity for a minimum of 30 minutes , and to commit weight loss. The fact that changes in all these areas will reduce or eliminate all together the development of diabetes is stressed.   Diabetic Labs Latest Ref Rng & Units 04/14/2016 01/03/2016 11/19/2015 08/09/2015 07/06/2014  HbA1c <5.7 % 6.0(H) - 6.7(H) 6.3(H) 6.3(H)  Chol <200 mg/dL 155 - - 162 113  HDL >50 mg/dL 70 - - 70 52  Calc LDL <100 mg/dL 59 - - 76 38  Triglycerides <150 mg/dL 131 - - 81 117  Creatinine 0.50 - 1.05 mg/dL 0.67 0.60 - 0.73 0.65   BP/Weight 04/16/2016 01/03/2016 12/27/2015 11/22/2015 10/17/2015 09/05/2015 123456  Systolic BP 123456 123XX123 123XX123 123456 Q000111Q A999333 Q000111Q  Diastolic BP 82 86 84 82 85 80 84  Wt. (Lbs) 242 244.8 - 254 245 249.2 249  BMI 44.26 44.77 - 46.45 44.8 45.57 45.53   No flowsheet data found.

## 2016-05-14 ENCOUNTER — Telehealth (INDEPENDENT_AMBULATORY_CARE_PROVIDER_SITE_OTHER): Payer: Self-pay | Admitting: Internal Medicine

## 2016-05-14 ENCOUNTER — Encounter (INDEPENDENT_AMBULATORY_CARE_PROVIDER_SITE_OTHER): Payer: Self-pay | Admitting: Internal Medicine

## 2016-05-14 NOTE — Telephone Encounter (Signed)
Patient called, stated that she had a bout with diverticulitis over the holidays, fortunately she had some medication on hand when this happened.  She would like a prescription to keep on hand in the event this happens again.  [phone]

## 2016-05-15 NOTE — Telephone Encounter (Signed)
She needs to call our office if she has another bout.

## 2016-05-21 ENCOUNTER — Telehealth (INDEPENDENT_AMBULATORY_CARE_PROVIDER_SITE_OTHER): Payer: Self-pay | Admitting: Internal Medicine

## 2016-05-21 NOTE — Telephone Encounter (Signed)
Called the patient and lmoam advising her.

## 2016-05-29 NOTE — Telephone Encounter (Signed)
err

## 2016-06-17 ENCOUNTER — Other Ambulatory Visit: Payer: Self-pay | Admitting: Family Medicine

## 2016-07-03 ENCOUNTER — Ambulatory Visit (INDEPENDENT_AMBULATORY_CARE_PROVIDER_SITE_OTHER): Payer: Commercial Managed Care - HMO | Admitting: Internal Medicine

## 2016-07-03 ENCOUNTER — Encounter (INDEPENDENT_AMBULATORY_CARE_PROVIDER_SITE_OTHER): Payer: Self-pay | Admitting: Internal Medicine

## 2016-07-03 VITALS — BP 178/100 | HR 84 | Temp 98.4°F | Ht 62.0 in | Wt 246.7 lb

## 2016-07-03 DIAGNOSIS — K5732 Diverticulitis of large intestine without perforation or abscess without bleeding: Secondary | ICD-10-CM | POA: Diagnosis not present

## 2016-07-03 NOTE — Patient Instructions (Signed)
If pain worsens go directly to the ED. Continue Cipro and Flagyl. PR in 1 weeks.

## 2016-07-03 NOTE — Progress Notes (Signed)
Subjective:    Patient ID: Caitlin Sampson, female    DOB: 07-Feb-1961, 56 y.o.   MRN: [id number]  HPI  Here today with c/o LL quadrant.abdominal pain. Started yesterday morning.     Has been taking 500mg  and Flagyl x 1 day. Last night the pain worsened. Rates the pain at a 9/10.   Last seen in August of 2017 with LLQ pain. Underwent a CT abdomen which revealed acute diverticulitis. She was covered with Ciopro and Flagyl. No fever. No nausea or vomiting. Is taking Hydrocodone for this.  She has a lot of gas.  This is her 3rd bout of diverticulitis. First bout in 2012.     01/03/2016 CT abdomen/pelvis with CM: LLQ pain: IMPRESSION: 1. Acute diverticulitis of the upper sigmoid colon, with associated bowel wall thickening and pericolonic inflammation/fluid stranding. No abscess collection seen. No evidence of bowel perforation seen. No associated bowel obstruction. 2. Fatty infiltration of the liver. 3. Aortic atherosclerosis.   10/17/2015 EGD: IDA  Impression: - Normal esophagus. - Z-line regular, 35 cm from the incisors. - 2 cm hiatal hernia. - A few gastric polyps. - Gastric antral vascular ectasia without bleeding. - Normal duodenal bulb and second portion of the  duodenum. Biopsied. - Normal hypopharynx.  Small bowel biopsy negative for celiac disease. Iron deficiency anemia may be secondary to impaired iron absorption due to chronic PPI therapy. Will check H&H in one month while patient is taking by mouth iron.   12/28/2013 Sigmoidoscopy,surveillance. Indications: small 21mm rectal carcinoid, close but negative margin.  INternal hemorrhoids were found. Small hemorrhoids.   02/21/2013 Colonoscopy.No active bleeding noted. No colonic neoplasms noted. Scattered diverticula were found  in the sigmoid colon. Three polyps were found in the sigmoid colon. Two removed via hotsnare and one via cold biopsy forceps. A nodule was found in the rectum, submucosal, firm ,rectal lesion removed via hot snare. Redundant sigmoid colon.  Biopsy Benign colonic epithelium from all three polyps.  Rectal biopsy: Carcinoid tumor. Approximately 0.4cm in the greates dimension, involving the submucosa, an coming to less than 0.1 mm of the apparent resection margin.    Review of Systems Past Medical History:  Diagnosis Date  . Anxiety   . Diverticulosis of colon 2012  . GERD (gastroesophageal reflux disease)   . Headache   . Hyperlipidemia   . Hypertension   . IDA (iron deficiency anemia)   . Poison oak dermatitis 2012    Past Surgical History:  Procedure Laterality Date  . ABDOMINAL HYSTERECTOMY    . CARDIAC CATHETERIZATION  09/05/03   EF 57%. NORMAL CORONARY ARTERIES. NO EVIDENCE OF RENAL ARTERY STENOSIS.  Marland Kitchen CHOLECYSTECTOMY    . ESOPHAGOGASTRODUODENOSCOPY N/A 10/17/2015   Procedure: ESOPHAGOGASTRODUODENOSCOPY (EGD);  Surgeon: Rogene Houston, MD;  Location: [facility];  Service: Endoscopy;  Laterality: N/A;  3:00  . MYOCARDIAL PERFUSION STUDY  03/30/09   NORMAL. EF 76%.  . TRANSTHORACIC ECHOCARDIOGRAM  10/10/08   NORMAL. EF => 55%.    Allergies  Allergen Reactions  . Vytorin [Ezetimibe-Simvastatin] Other (See Comments)    Joint pain    Current Outpatient Prescriptions on File Prior to Visit  Medication Sig Dispense Refill  . aspirin 81 MG tablet Take 81 mg by mouth daily.      Marland Kitchen azelastine (ASTELIN) 0.1 % nasal spray Place 2 sprays into both nostrils 2 (two) times daily. Use in each nostril as directed 90 mL 3  . cetirizine (ZYRTEC) 10 MG tablet Take 10 mg by  mouth as needed for allergies.    Marland Kitchen FLUoxetine (PROZAC) 40 MG capsule Take 1 capsule (40 mg total) by mouth daily. 90 capsule 1  . pantoprazole (PROTONIX) 40 MG tablet Take 1 tablet (40 mg total) by mouth daily. 90 tablet  1  . potassium chloride (K-DUR,KLOR-CON) 10 MEQ tablet Take 1 tablet (10 mEq total) by mouth daily. 90 tablet 1  . rosuvastatin (CRESTOR) 10 MG tablet TAKE 1 TABLET BY MOUTH  DAILY 90 tablet 1  . valsartan-hydrochlorothiazide (DIOVAN-HCT) 160-25 MG tablet Take 1 tablet by mouth daily. 90 tablet 1  . metFORMIN (GLUCOPHAGE) 500 MG tablet Take 1 tablet (500 mg total) by mouth daily with breakfast. (Patient not taking: Reported on 07/03/2016) 90 tablet 3  . [DISCONTINUED] potassium chloride (K-DUR) 10 MEQ tablet Take 1 tablet (10 mEq total) by mouth 2 (two) times daily. 60 tablet 5   No current facility-administered medications on file prior to visit.       Objective:   Physical Exam Blood pressure (!) 178/100, pulse 84, temperature 98.4 F (36.9 C), height 5\' 2"  (1.575 m), weight 246 lb 11.2 oz (111.9 kg).  Alert and oriented. Skin warm and dry. Oral mucosa is moist.   . Sclera anicteric, conjunctivae is pink. Thyroid not enlarged. No cervical lymphadenopathy. Lungs clear. Heart regular rate and rhythm.  Abdomen is soft. Bowel sounds are positive. No hepatomegaly. No abdominal masses felt. Tenderness LLQ  No edema to lower extremities.         Assessment & Plan:  Probable acute diverticulitis. She presently taking Ciopro 500mg  BID and Flagyl 500mg  TID. Continue. R for Hydrocodone # 20 with no refill. Advised if pain worsens, to go immediately to the ED.

## 2016-07-14 ENCOUNTER — Telehealth (INDEPENDENT_AMBULATORY_CARE_PROVIDER_SITE_OTHER): Payer: Self-pay | Admitting: Internal Medicine

## 2016-07-14 DIAGNOSIS — K5732 Diverticulitis of large intestine without perforation or abscess without bleeding: Secondary | ICD-10-CM

## 2016-07-14 MED ORDER — METRONIDAZOLE 500 MG PO TABS: 500.0000 mg | ORAL_TABLET | Freq: Three times a day (TID) | ORAL | 0 refills | Status: DC

## 2016-07-14 MED ORDER — CIPROFLOXACIN HCL 500 MG PO TABS: 500.0000 mg | ORAL_TABLET | Freq: Two times a day (BID) | ORAL | 0 refills | Status: DC

## 2016-07-14 NOTE — Telephone Encounter (Signed)
Message left

## 2016-07-14 NOTE — Telephone Encounter (Signed)
Rx sent to her pharmacy 

## 2016-07-14 NOTE — Telephone Encounter (Signed)
Patient called to give a progress report.  She stated that she is better, still on the antibiotic and still has a little discomfort if she moves a certain way on her left side.  She stated that Karna Christmas mentioned doing a 14 day supply.  [phone]

## 2016-07-22 ENCOUNTER — Telehealth (INDEPENDENT_AMBULATORY_CARE_PROVIDER_SITE_OTHER): Payer: Self-pay | Admitting: *Deleted

## 2016-07-22 NOTE — Telephone Encounter (Signed)
Please give her a call back.  You had mentioned she may need surgery and wants your recommendations  [phone]

## 2016-07-23 ENCOUNTER — Telehealth (INDEPENDENT_AMBULATORY_CARE_PROVIDER_SITE_OTHER): Payer: Self-pay | Admitting: Internal Medicine

## 2016-07-23 DIAGNOSIS — K5732 Diverticulitis of large intestine without perforation or abscess without bleeding: Secondary | ICD-10-CM

## 2016-07-23 MED ORDER — METRONIDAZOLE 500 MG PO TABS: 500.0000 mg | ORAL_TABLET | Freq: Three times a day (TID) | ORAL | 0 refills | Status: DC

## 2016-07-23 MED ORDER — CIPROFLOXACIN HCL 500 MG PO TABS: 500.0000 mg | ORAL_TABLET | Freq: Two times a day (BID) | ORAL | 0 refills | Status: DC

## 2016-07-23 NOTE — Telephone Encounter (Signed)
Rx for cipro and Flagyl sent to her pharmacy

## 2016-07-23 NOTE — Telephone Encounter (Signed)
States she thinks she may be having a diverticular flare. cipro and Flagyl sent to her pharmacy

## 2016-08-27 ENCOUNTER — Ambulatory Visit: Payer: Commercial Managed Care - HMO | Admitting: Family Medicine

## 2016-09-22 ENCOUNTER — Telehealth: Payer: Self-pay | Admitting: Cardiovascular Disease

## 2016-09-22 ENCOUNTER — Emergency Department (HOSPITAL_COMMUNITY)
Admission: EM | Admit: 2016-09-22 | Discharge: 2016-09-22 | Disposition: A | Discharge status: Home / Self Care | Payer: 59 | Attending: Emergency Medicine | Admitting: Emergency Medicine

## 2016-09-22 ENCOUNTER — Emergency Department (HOSPITAL_COMMUNITY): Payer: 59

## 2016-09-22 ENCOUNTER — Encounter (HOSPITAL_COMMUNITY): Payer: Self-pay | Admitting: *Deleted

## 2016-09-22 ENCOUNTER — Other Ambulatory Visit: Payer: Self-pay

## 2016-09-22 DIAGNOSIS — E785 Hyperlipidemia, unspecified: Secondary | ICD-10-CM | POA: Diagnosis present

## 2016-09-22 DIAGNOSIS — R0782 Intercostal pain: Secondary | ICD-10-CM

## 2016-09-22 DIAGNOSIS — I10 Essential (primary) hypertension: Secondary | ICD-10-CM | POA: Insufficient documentation

## 2016-09-22 DIAGNOSIS — Z7984 Long term (current) use of oral hypoglycemic drugs: Secondary | ICD-10-CM | POA: Insufficient documentation

## 2016-09-22 DIAGNOSIS — Z79899 Other long term (current) drug therapy: Secondary | ICD-10-CM | POA: Insufficient documentation

## 2016-09-22 DIAGNOSIS — Z7982 Long term (current) use of aspirin: Secondary | ICD-10-CM | POA: Diagnosis not present

## 2016-09-22 DIAGNOSIS — R7303 Prediabetes: Secondary | ICD-10-CM | POA: Diagnosis present

## 2016-09-22 DIAGNOSIS — E669 Obesity, unspecified: Secondary | ICD-10-CM | POA: Diagnosis present

## 2016-09-22 DIAGNOSIS — R079 Chest pain, unspecified: Secondary | ICD-10-CM | POA: Diagnosis present

## 2016-09-22 DIAGNOSIS — E8881 Metabolic syndrome: Secondary | ICD-10-CM | POA: Diagnosis present

## 2016-09-22 DIAGNOSIS — Z87891 Personal history of nicotine dependence: Secondary | ICD-10-CM | POA: Insufficient documentation

## 2016-09-22 DIAGNOSIS — F411 Generalized anxiety disorder: Secondary | ICD-10-CM | POA: Diagnosis present

## 2016-09-22 DIAGNOSIS — R072 Precordial pain: Secondary | ICD-10-CM | POA: Insufficient documentation

## 2016-09-22 DIAGNOSIS — K219 Gastro-esophageal reflux disease without esophagitis: Secondary | ICD-10-CM | POA: Diagnosis present

## 2016-09-22 LAB — I-STAT TROPONIN, ED
Troponin i, poc: 0 ng/mL (ref 0.00–0.08)
Troponin i, poc: 0 ng/mL (ref 0.00–0.08)

## 2016-09-22 LAB — BASIC METABOLIC PANEL
ANION GAP: 10 (ref 5–15)
BUN: 10 mg/dL (ref 6–20)
CHLORIDE: 102 mmol/L (ref 101–111)
CO2: 26 mmol/L (ref 22–32)
Calcium: 10.6 mg/dL — ABNORMAL HIGH (ref 8.9–10.3)
Creatinine, Ser: 0.65 mg/dL (ref 0.44–1.00)
GFR calc non Af Amer: >60 mL/min (ref >60)
GLUCOSE: 114 mg/dL — AB (ref 65–99)
Potassium: 3.3 mmol/L — ABNORMAL LOW (ref 3.5–5.1)
Sodium: 138 mmol/L (ref 135–145)

## 2016-09-22 LAB — CBC
HEMATOCRIT: 39.7 % (ref 36.0–46.0)
Hemoglobin: 12.7 g/dL (ref 12.0–15.0)
MCH: 27.7 pg (ref 26.0–34.0)
MCHC: 32 g/dL (ref 30.0–36.0)
MCV: 86.5 fL (ref 78.0–100.0)
Platelets: 341 10*3/uL (ref 150–400)
RBC: 4.59 MIL/uL (ref 3.87–5.11)
RDW: 16 % — AB (ref 11.5–15.5)
WBC: 10.1 10*3/uL (ref 4.0–10.5)

## 2016-09-22 MED ORDER — ASPIRIN 81 MG PO CHEW
324.0000 mg | CHEWABLE_TABLET | Freq: Once | ORAL | Status: AC
  Administered 2016-09-22: 324 mg via ORAL
  Filled 2016-09-22: qty 4

## 2016-09-22 MED ORDER — TRAMADOL HCL 50 MG PO TABS: 50.0000 mg | ORAL_TABLET | Freq: Four times a day (QID) | ORAL | 0 refills | Status: DC | PRN

## 2016-09-22 NOTE — Telephone Encounter (Signed)
Returned call to patient.Stated she has been having chest pain since this past Friday.Stated she has took Protonix and Nexium with no relief.Stated at present she is having constant burning in left chest.Rates pain # 5.Advised to go to [facility] ED.Trish notified.

## 2016-09-22 NOTE — Telephone Encounter (Signed)
Pt says she have been having,what feels like heart burn since Friday,still have it right now.She said she was nauseated on Saturday,pt is very coocerned that it might be her heart.

## 2016-09-22 NOTE — Consult Note (Signed)
CARDIOLOGY CONSULT NOTE   Patient ID: Caitlin Sampson MRN: [id number] DOB/AGE: 09/03/1960 56 y.o.  Admit date: 09/22/2016  Requesting Physician: Dr. Johnney Killian  Primary Physician:   Fayrene Helper, MD Primary Cardiologist:  Dr. Claiborne Billings Reason for Consultation:  Chest pain   Caitlin Sampson is a 56 y.o. female who is being seen today for the evaluation of chest pain at the request of Dr. Dorothyann Gibbs.   HPI: Caitlin Sampson is a 56 y.o. female with a history of HTN, HLD, metabolic syndrome, GERD who presented to [facility] today with chest pain.   She underwent a cardiac catheterization in 2005 for chest pain and was found to have mid LAD intramyocardial segment but did not have obstructive disease.  In the fall of 2013 she developed episodes of chest tightness with mild shortness of breath. A nuclear perfusion study in 04/2012 revealed normal perfusion and function with normal wall motion. There is a strong family history for CAD with her father suffering an initial myocardial infarction at age 37. Aortic aneurysm in later years.  She was last seen by Dr. Claiborne Billings in the office in 09/2014 and doing well.   She was in her usual state of health until last Friday when she started noticing a left sided burning pain. It has been constant and ongoing since that time. No aggravating or alleviating factors. Not worse with exertion or in any certain positions. It has continued to be ~5/10 pain. No radiation or associated SOB or diaphoresis. She tried multiple different GERD medications and has been compliant with her daily protonix. She called Dr. Evette Georges office and she was told to go to the ED.  No LE edema, orthopnea or PND. No dizziness or syncope. No blood in stool or urine. No palpitations. She is not very active and does not exercise. She works behind a Network engineer. She denies any exertional sx.     Past Medical History:  Diagnosis Date  . Anxiety   . Diverticulosis of colon 2012  . GERD (gastroesophageal  reflux disease)   . Headache   . Hyperlipidemia   . Hypertension   . IDA (iron deficiency anemia)   . Poison oak dermatitis 2012     Past Surgical History:  Procedure Laterality Date  . ABDOMINAL HYSTERECTOMY    . CARDIAC CATHETERIZATION  09/05/03   EF 57%. NORMAL CORONARY ARTERIES. NO EVIDENCE OF RENAL ARTERY STENOSIS.  Marland Kitchen CHOLECYSTECTOMY    . ESOPHAGOGASTRODUODENOSCOPY N/A 10/17/2015   Procedure: ESOPHAGOGASTRODUODENOSCOPY (EGD);  Surgeon: Rogene Houston, MD;  [facility];  Service: Endoscopy;  Laterality: N/A;  3:00  . MYOCARDIAL PERFUSION STUDY  03/30/09   NORMAL. EF 76%.  . TRANSTHORACIC ECHOCARDIOGRAM  10/10/08   NORMAL. EF => 55%.    Allergies  Allergen Reactions  . Vytorin [Ezetimibe-Simvastatin] Other (See Comments)    Joint pain    I have reviewed the patient's current medications     Prior to Admission medications   Medication Sig Start Date End Date Taking? Authorizing Provider  aspirin 81 MG tablet Take 81 mg by mouth daily.     Yes [provider]  azelastine (ASTELIN) 0.1 % nasal spray Place 2 sprays into both nostrils 2 (two) times daily. Use in each nostril as directed Patient taking differently: Place 2 sprays into both nostrils 2 (two) times daily as needed for rhinitis or allergies. Use in each nostril as directed 11/22/15  Yes Fayrene Helper, MD  cetirizine Alethia Berthold)  10 MG tablet Take 10 mg by mouth daily as needed for allergies.    Yes [provider]  FLUoxetine (PROZAC) 40 MG capsule Take 1 capsule (40 mg total) by mouth daily. 04/16/16  Yes Fayrene Helper, MD  pantoprazole (PROTONIX) 40 MG tablet Take 1 tablet (40 mg total) by mouth daily. 04/16/16  Yes Fayrene Helper, MD  potassium chloride (K-DUR,KLOR-CON) 10 MEQ tablet Take 1 tablet (10 mEq total) by mouth daily. 04/16/16  Yes Fayrene Helper, MD  rosuvastatin (CRESTOR) 10 MG tablet TAKE 1 TABLET BY MOUTH  DAILY 06/17/16  Yes Fayrene Helper, MD    valsartan-hydrochlorothiazide (DIOVAN-HCT) 160-25 MG tablet Take 1 tablet by mouth daily. 04/16/16  Yes Fayrene Helper, MD  ciprofloxacin (CIPRO) 500 MG tablet Take 1 tablet (500 mg total) by mouth 2 (two) times daily. Patient not taking: Reported on 09/22/2016 07/23/16   Butch Penny, NP  metFORMIN (GLUCOPHAGE) 500 MG tablet Take 1 tablet (500 mg total) by mouth daily with breakfast. Patient not taking: Reported on 07/03/2016 04/16/16   Fayrene Helper, MD  metroNIDAZOLE (FLAGYL) 500 MG tablet Take 1 tablet (500 mg total) by mouth 3 (three) times daily. Patient not taking: Reported on 09/22/2016 07/23/16   Butch Penny, NP     Social History   Social History  . Marital status: Married    Spouse name: N/A  . Number of children: N/A  . Years of education: N/A   Occupational History  . Not on file.   Social History Main Topics  . Smoking status: Former Research scientist (life sciences)  . Smokeless tobacco: Never Used     Comment: quit smoking for over 20 yrs.   . Alcohol use No  . Drug use: No  . Sexual activity: Not on file   Other Topics Concern  . Not on file   Social History Narrative  . No narrative on file    Family Status  Relation Status  . Mother Alive  . Father Alive  . Brother Alive  . Maternal Grandmother Deceased  . Brother Alive  . Maternal Grandfather Deceased  . Paternal Grandmother Deceased  . Paternal Grandfather Deceased  . Other Alive  . Child Alive   good health   Family History  Problem Relation Age of Onset  . Heart disease Mother     a fib  . Kidney disease Mother   . Heart disease Father     MI  . Hyperlipidemia Father   . Hypertension Father   . Kidney disease Father   . Kidney disease Brother   . Diabetes Maternal Grandmother   . Stroke Maternal Grandmother     ROS:  Full 14 point review of systems complete and found to be negative unless listed above.  Physical Exam: Blood pressure 129/68, pulse 83, temperature 98.5 F (36.9 C), temperature  source Oral, resp. rate (!) 21, SpO2 95 %.  General: Well developed, well nourished, female in no acute distress, obese  Head: Eyes PERRLA, No xanthomas.   Normocephalic and atraumatic, oropharynx without edema or exudate.   Lungs: CTAB Heart: HRRR S1 S2, no rub/gallop, Heart regular rate and rhythm with S1, S2  murmur. pulses are 2+ extrem.   Neck: No carotid bruits. No lymphadenopathy. No JVD. Abdomen: Bowel sounds present, abdomen soft and non-tender without masses or hernias noted. Msk:  No spine or cva tenderness. No weakness, no joint deformities or effusions. Extremities: No clubbing or cyanosis. No LE edema.  Neuro:  Alert and oriented X 3. No focal deficits noted. Psych:  Good affect, responds appropriately Skin: No rashes or lesions noted.  Labs:   Lab Results  Component Value Date   WBC 10.1 09/22/2016   HGB 12.7 09/22/2016   HCT 39.7 09/22/2016   MCV 86.5 09/22/2016   PLT 341 09/22/2016   No results for input(s): INR in the last 72 hours.   Recent Labs Lab 09/22/16 1053  NA 138  K 3.3*  CL 102  CO2 26  BUN 10  CREATININE 0.65  CALCIUM 10.6*  GLUCOSE 114*   No results found for: MG No results for input(s): CKTOTAL, CKMB, TROPONINI in the last 72 hours.  Recent Labs  09/22/16 1109  TROPIPOC 0.00   No results found for: PROBNP Lab Results  Component Value Date   CHOL 155 04/14/2016   HDL 70 04/14/2016   LDLCALC 59 04/14/2016   TRIG 131 04/14/2016   No results found for: DDIMER No results found for: LIPASE, AMYLASE TSH  Date/Time Value Ref Range Status  08/09/2015 07:40 AM 2.15 mIU/L Final    Comment:      Reference Range   > or = 20 Years  0.40-4.50   Pregnancy Range First trimester  0.26-2.66 Second trimester 0.55-2.73 Third trimester  0.43-2.91      Ferritin  Date/Time Value Ref Range Status  04/14/2016 09:21 AM 13 10 - 232 ng/mL Final   Iron  Date/Time Value Ref Range Status  04/14/2016 09:21 AM 81 45 - 160 ug/dL Final    ECG:   NSR with HR 76- personally reviewed  TELE: NSR  - personally reviewed  Radiology:  Dg Chest 2 View  Result Date: 09/22/2016 CLINICAL DATA:  Chest pain EXAM: CHEST  2 VIEW COMPARISON:  04/15/2014 FINDINGS: The heart size and mediastinal contours are within normal limits. Platelike atelectasis of the right middle lobe noted. Both lungs are clear. The visualized skeletal structures are unremarkable. IMPRESSION: 1. Subsegmental atelectasis noted within the right middle lobe. Electronically Signed   By: Kerby Moors M.D.   On: 09/22/2016 11:16    ASSESSMENT AND PLAN:    Principal Problem:   Chest pain Active Problems:   Hyperlipidemia   MORBID OBESITY   GERD   Prediabetes   GAD (generalized anxiety disorder)   Metabolic syndrome X   Essential hypertension  Caitlin Sampson is a 56 y.o. female with a history of HTN, HLD, metabolic syndrome, GERD who presented to [facility] today with chest pain.   Chest pain: she does have several RFs for CAD including HTN, HLD, metabolic syndrome, fam hx. However, her chest pain is very atypical and has been constant and ongoing for several days. There are no objective signs of ischemia. Troponin POC is negative. ECG with no acute ST or TW changes. She has had chest pain in the past with no CAD on cath and normal nuc in 2013. If delta troponin negative, she can go home.   HTN: BP well controlled currently. Continue current meds.  HLD: continue statin   Metabolic syndrome: continue metformin. She needs to focus on diet and exercise.    Signed: Angelena Form, PA-C 09/22/2016 4:39 PM  Pager [phone]  Co-Sign MD  Patient seen, examined. Available data reviewed. Agree with findings, assessment, and plan as outlined by Angelena Form, PA-C. The patient is A&Ox4, NAD, JVP nl, carotids normal without bruits, CV: RRR no murmur, lungs CTA, abdomen soft, NT, extremities no edema. EKG shows sinus rhythm, nonspecific  ST change (< 65mm ST depression) unchanged from  prior tracings, and troponin 0.0 x 2. Symptoms are highly atypical with a focal area of tenderness over the left chest. Previous cath with no CAD. No further cardiac testing indicated. Likely chest wall pain/costochondritis.   Sherren Mocha, M.D. 09/22/2016 9:26 PM

## 2016-09-22 NOTE — ED Provider Notes (Signed)
[facility] DEPT Provider Note   CSN: [id number] Arrival date & time: 09/22/16  1039     History   Chief Complaint Chief Complaint  Patient presents with  . Chest Pain    HPI Caitlin Sampson is a 56 y.o. female.  HPI She reports she had onset of chest pain 3 days ago. Pain is in the left mid upper chest. He has been fairly aching constant pain. There was an occasional sharper pain that radiated to her shoulder blade. Patient reports initially she felt a little hot and sweaty but sweaty and nausea has not been a recurrent symptoms associated with this. No associated shortness of breath. No syncope. No lower extremity swelling or calf pain. No fever cough. Patient did however have a persistent bronchitis for several weeks of cough that finally resolved about 2 weeks ago. Patient reports history of GERD. She tried multiple different GERD medications and has been compliant with her daily protonix. This did not offer any relief of symptoms. Patient does not have coronary ischemic history however she does see cardiology on a regular basis and take a daily aspirin. No history of DVT or PE.  Family history: Father had an MI in his mid 71s. Aortic aneurysm in later years. Mother atrial fibrillation. 2 younger brothers with no coronary artery disease known.  Social history: Nonsmoker, married, sedentary work in an office environment.  Past Medical History:  Diagnosis Date  . Anxiety   . Diverticulosis of colon 2012  . GERD (gastroesophageal reflux disease)   . Headache   . Hyperlipidemia   . Hypertension   . IDA (iron deficiency anemia)   . Poison oak dermatitis 2012    Patient Active Problem List   Diagnosis Date Noted  . Urinary frequency 04/20/2016  . Allergic rhinitis 11/22/2015  . Rectal carcinoid tumor 08/19/2015  . Internal hemorrhoids 08/19/2015  . Diverticular disease of colon 08/19/2015  . IDA (iron deficiency anemia) 08/14/2015  . Essential hypertension 02/22/2014  .  Metabolic syndrome X 80/99/8338  . GAD (generalized anxiety disorder) 10/30/2011  . Prediabetes 11/06/2010  . Chest pain 05/02/2010  . Hyperlipidemia 04/29/2006  . MORBID OBESITY 04/29/2006  . GERD 04/29/2006  . IBS 04/29/2006  . CALCULUS, KIDNEY 04/29/2006    Past Surgical History:  Procedure Laterality Date  . ABDOMINAL HYSTERECTOMY    . CARDIAC CATHETERIZATION  09/05/03   EF 57%. NORMAL CORONARY ARTERIES. NO EVIDENCE OF RENAL ARTERY STENOSIS.  Marland Kitchen CHOLECYSTECTOMY    . ESOPHAGOGASTRODUODENOSCOPY N/A 10/17/2015   Procedure: ESOPHAGOGASTRODUODENOSCOPY (EGD);  Surgeon: Rogene Houston, MD;  Location: [facility];  Service: Endoscopy;  Laterality: N/A;  3:00  . MYOCARDIAL PERFUSION STUDY  03/30/09   NORMAL. EF 76%.  . TRANSTHORACIC ECHOCARDIOGRAM  10/10/08   NORMAL. EF => 55%.    OB History    No data available       Home Medications    Prior to Admission medications   Medication Sig Start Date End Date Taking? Authorizing Provider  aspirin 81 MG tablet Take 81 mg by mouth daily.     Yes [provider]  azelastine (ASTELIN) 0.1 % nasal spray Place 2 sprays into both nostrils 2 (two) times daily. Use in each nostril as directed Patient taking differently: Place 2 sprays into both nostrils 2 (two) times daily as needed for rhinitis or allergies. Use in each nostril as directed 11/22/15  Yes Fayrene Helper, MD  cetirizine (ZYRTEC) 10 MG tablet Take 10 mg by mouth daily  as needed for allergies.    Yes [provider]  FLUoxetine (PROZAC) 40 MG capsule Take 1 capsule (40 mg total) by mouth daily. 04/16/16  Yes Fayrene Helper, MD  pantoprazole (PROTONIX) 40 MG tablet Take 1 tablet (40 mg total) by mouth daily. 04/16/16  Yes Fayrene Helper, MD  potassium chloride (K-DUR,KLOR-CON) 10 MEQ tablet Take 1 tablet (10 mEq total) by mouth daily. 04/16/16  Yes Fayrene Helper, MD  rosuvastatin (CRESTOR) 10 MG tablet TAKE 1 TABLET BY MOUTH  DAILY 06/17/16  Yes  Fayrene Helper, MD  valsartan-hydrochlorothiazide (DIOVAN-HCT) 160-25 MG tablet Take 1 tablet by mouth daily. 04/16/16  Yes Fayrene Helper, MD  ciprofloxacin (CIPRO) 500 MG tablet Take 1 tablet (500 mg total) by mouth 2 (two) times daily. Patient not taking: Reported on 09/22/2016 07/23/16   Butch Penny, NP  metFORMIN (GLUCOPHAGE) 500 MG tablet Take 1 tablet (500 mg total) by mouth daily with breakfast. Patient not taking: Reported on 07/03/2016 04/16/16   Fayrene Helper, MD  metroNIDAZOLE (FLAGYL) 500 MG tablet Take 1 tablet (500 mg total) by mouth 3 (three) times daily. Patient not taking: Reported on 09/22/2016 07/23/16   Butch Penny, NP  traMADol (ULTRAM) 50 MG tablet Take 1 tablet (50 mg total) by mouth every 6 (six) hours as needed. 09/22/16   Charlesetta Shanks, MD    Family History Family History  Problem Relation Age of Onset  . Heart disease Mother     a fib  . Kidney disease Mother   . Heart disease Father     MI  . Hyperlipidemia Father   . Hypertension Father   . Kidney disease Father   . Kidney disease Brother   . Diabetes Maternal Grandmother   . Stroke Maternal Grandmother     Social History Social History  Substance Use Topics  . Smoking status: Former Research scientist (life sciences)  . Smokeless tobacco: Never Used     Comment: quit smoking for over 20 yrs.   . Alcohol use No     Allergies   Vytorin [ezetimibe-simvastatin]   Review of Systems Review of Systems 10 Systems reviewed and are negative for acute change except as noted in the HPI.   Physical Exam Updated Vital Signs BP 123/65   Pulse 76   Temp 98.5 F (36.9 C) (Oral)   Resp (!) 21   SpO2 95%   Physical Exam  Constitutional: She is oriented to person, place, and time. She appears well-developed and well-nourished. No distress.  HENT:  Head: Normocephalic and atraumatic.  Mouth/Throat: Oropharynx is clear and moist.  Eyes: Conjunctivae and EOM are normal. Pupils are equal, round, and reactive to  light.  Neck: Neck supple.  Cardiovascular: Normal rate, regular rhythm, normal heart sounds and intact distal pulses.   No murmur heard. Pulmonary/Chest: Effort normal and breath sounds normal. No respiratory distress. She exhibits no tenderness.  Abdominal: Soft. She exhibits no distension. There is no tenderness. There is no guarding.  Musculoskeletal: She exhibits no edema or tenderness.  Calves Nontender no peripheral edema  Neurological: She is alert and oriented to person, place, and time. No cranial nerve deficit. She exhibits normal muscle tone. Coordination normal.  Skin: Skin is warm and dry.  Psychiatric: She has a normal mood and affect.  Nursing note and vitals reviewed.    ED Treatments / Results  Labs (all labs ordered are listed, but only abnormal results are displayed) Labs Reviewed  BASIC METABOLIC PANEL -  Abnormal; Notable for the following:       Result Value   Potassium 3.3 (*)    Glucose, Bld 114 (*)    Calcium 10.6 (*)    All other components within normal limits  CBC - Abnormal; Notable for the following:    RDW 16.0 (*)    All other components within normal limits  I-STAT TROPOININ, ED  I-STAT TROPOININ, ED    EKG  EKG Interpretation Sinus rhythm 76 PR 154 QRS 86 QTC 407 Low voltage QRS T-wave inversion V1 V2 V3 flat with slight ST depression. New ST depression inferior leads. No STEMI. Compared to previous, V2 inversion and inferior depression new       Radiology Dg Chest 2 View  Result Date: 09/22/2016 CLINICAL DATA:  Chest pain EXAM: CHEST  2 VIEW COMPARISON:  04/15/2014 FINDINGS: The heart size and mediastinal contours are within normal limits. Platelike atelectasis of the right middle lobe noted. Both lungs are clear. The visualized skeletal structures are unremarkable. IMPRESSION: 1. Subsegmental atelectasis noted within the right middle lobe. Electronically Signed   By: Kerby Moors M.D.   On: 09/22/2016 11:16     Procedures Procedures (including critical care time)  Medications Ordered in ED Medications  aspirin chewable tablet 324 mg (324 mg Oral Given 09/22/16 1528)     Initial Impression / Assessment and Plan / ED Course  I have reviewed the triage vital signs and the nursing notes.  Pertinent labs & imaging results that were available during my care of the patient were reviewed by me and considered in my medical decision making (see chart for details).    Consult cardiology  Final Clinical Impressions(s) / ED Diagnoses   Final diagnoses:  Precordial pain  Cardiology consult obtained. At this point they feels low risk for cardiac ischemic disorder. Repeat troponin has been obtained and normal. Patient is compliant with reflux medications. Consideration is for chest wall pain. Patient will be given tramadol to take them. They for discomfort and advised on follow-up with PCP.  New Prescriptions New Prescriptions   TRAMADOL (ULTRAM) 50 MG TABLET    Take 1 tablet (50 mg total) by mouth every 6 (six) hours as needed.     Charlesetta Shanks, MD 09/22/16 [phone]

## 2016-09-22 NOTE — ED Notes (Signed)
Pt is in stable condition upon d/c and ambulates from ED. 

## 2016-09-22 NOTE — ED Triage Notes (Signed)
PT states left chest burning sensation that started on Friday and has had radiation to back and abdomen.  Pt states that burning was constant.  Pt denies sob, but reported some nausea

## 2016-09-22 NOTE — ED Notes (Signed)
Pt states she has a family hx of AAA.

## 2016-09-25 ENCOUNTER — Telehealth: Payer: Self-pay

## 2016-09-25 DIAGNOSIS — I10 Essential (primary) hypertension: Secondary | ICD-10-CM

## 2016-09-25 DIAGNOSIS — E7849 Other hyperlipidemia: Secondary | ICD-10-CM

## 2016-09-25 DIAGNOSIS — R7303 Prediabetes: Secondary | ICD-10-CM

## 2016-09-25 NOTE — Telephone Encounter (Signed)
-----   Message from Fayrene Helper, MD sent at 09/25/2016  9:24 AM EDT ----- Regarding: lab order to [facility] pls fax Fasting lipid, hBA1C and tSH has upcoming appt, thanks

## 2016-09-28 ENCOUNTER — Other Ambulatory Visit: Payer: Self-pay | Admitting: Family Medicine

## 2016-10-02 LAB — TSH: TSH: 3.74 u[IU]/mL (ref 0.450–4.500)

## 2016-10-02 LAB — HEMOGLOBIN A1C
Est. average glucose Bld gHb Est-mCnc: 140 mg/dL
Hgb A1c MFr Bld: 6.5 % — ABNORMAL HIGH (ref 4.8–5.6)

## 2016-10-02 LAB — HEPATIC FUNCTION PANEL
ALT: 27 IU/L (ref 0–32)
AST: 23 IU/L (ref 0–40)
Albumin: 4.5 g/dL (ref 3.5–5.5)
Alkaline Phosphatase: 91 IU/L (ref 39–117)
Bilirubin, Direct: 0.08 mg/dL (ref 0.00–0.40)
Total Protein: 6.8 g/dL (ref 6.0–8.5)

## 2016-10-02 LAB — LIPID PANEL
Chol/HDL Ratio: 2.5 ratio (ref 0.0–4.4)
Cholesterol, Total: 175 mg/dL (ref 100–199)
HDL: 70 mg/dL (ref >39)
LDL Calculated: 80 mg/dL (ref 0–99)
TRIGLYCERIDES: 126 mg/dL (ref 0–149)
VLDL CHOLESTEROL CAL: 25 mg/dL (ref 5–40)

## 2016-10-06 ENCOUNTER — Encounter: Payer: Self-pay | Admitting: Family Medicine

## 2016-10-06 ENCOUNTER — Ambulatory Visit (INDEPENDENT_AMBULATORY_CARE_PROVIDER_SITE_OTHER): Payer: 59 | Admitting: Family Medicine

## 2016-10-06 VITALS — BP 130/84 | HR 79 | Resp 16 | Ht 62.0 in | Wt 251.0 lb

## 2016-10-06 DIAGNOSIS — E784 Other hyperlipidemia: Secondary | ICD-10-CM

## 2016-10-06 DIAGNOSIS — E1169 Type 2 diabetes mellitus with other specified complication: Secondary | ICD-10-CM

## 2016-10-06 DIAGNOSIS — I10 Essential (primary) hypertension: Secondary | ICD-10-CM

## 2016-10-06 DIAGNOSIS — F411 Generalized anxiety disorder: Secondary | ICD-10-CM | POA: Diagnosis not present

## 2016-10-06 DIAGNOSIS — E1159 Type 2 diabetes mellitus with other circulatory complications: Secondary | ICD-10-CM | POA: Diagnosis not present

## 2016-10-06 DIAGNOSIS — E8881 Metabolic syndrome: Secondary | ICD-10-CM | POA: Diagnosis not present

## 2016-10-06 DIAGNOSIS — Z09 Encounter for follow-up examination after completed treatment for conditions other than malignant neoplasm: Secondary | ICD-10-CM

## 2016-10-06 DIAGNOSIS — E669 Obesity, unspecified: Secondary | ICD-10-CM | POA: Diagnosis not present

## 2016-10-06 DIAGNOSIS — D3A026 Benign carcinoid tumor of the rectum: Secondary | ICD-10-CM | POA: Diagnosis not present

## 2016-10-06 DIAGNOSIS — E7849 Other hyperlipidemia: Secondary | ICD-10-CM

## 2016-10-06 MED ORDER — POTASSIUM CHLORIDE ER 10 MEQ PO TBCR: 10.0000 meq | EXTENDED_RELEASE_TABLET | Freq: Two times a day (BID) | ORAL | 3 refills | Status: DC

## 2016-10-06 MED ORDER — PANTOPRAZOLE SODIUM 40 MG PO TBEC: 40.0000 mg | DELAYED_RELEASE_TABLET | Freq: Every day | ORAL | 3 refills | Status: DC

## 2016-10-06 NOTE — Patient Instructions (Signed)
F/u in 4 months, call if you need me before  If unable to tolerate the metformin,  Not an issue , you are able to normalize you HBA1C by diet and exercise only, you cAN do this  PLATE method  It is important that you exercise regularly at least 30 minutes 5 times a week. If you develop chest pain, have severe difficulty breathing, or feel very tired, stop exercising immediately and seek medical attention    I support bariatric surgery for you 100%    HBA1C, chem 7 and EGFr in 4 months  Thank you  for choosing [facility]. We consider it a privelige to serve you.  Delivering excellent health care in a caring and  compassionate way is our goal.  Partnering with you,  so that together we can achieve this goal is our strategy.

## 2016-10-10 ENCOUNTER — Encounter: Payer: Self-pay | Admitting: Family Medicine

## 2016-10-10 DIAGNOSIS — E669 Obesity, unspecified: Secondary | ICD-10-CM

## 2016-10-10 DIAGNOSIS — E1169 Type 2 diabetes mellitus with other specified complication: Secondary | ICD-10-CM | POA: Insufficient documentation

## 2016-10-12 DIAGNOSIS — Z09 Encounter for follow-up examination after completed treatment for conditions other than malignant neoplasm: Secondary | ICD-10-CM | POA: Insufficient documentation

## 2016-10-12 NOTE — Progress Notes (Signed)
Caitlin Sampson     MRN: [id number]      DOB: 1961-03-15   HPI Caitlin Sampson is here for follow up and re-evaluation of chronic medical conditions, medication management and review of any available recent lab and radiology data.  Preventive health is updated, specifically  Cancer screening and Immunization.   recentlyin ED with chest pain, seen by cardiology there, no cardiac cause, wants referral/ letter of support for gastric bypass, which is medically indicated, multiple co morbidities,unsuccesssful weight loss despite multiple attempts over the years and increasing weight gain. The PT denies any adverse reactions to current medications since the last visit.  There are no new concerns.  There are no specific complaints   ROS Denies recent fever or chills. Denies sinus pressure, nasal congestion, ear pain or sore throat. Denies chest congestion, productive cough or wheezing. Denies chest pains, palpitations and leg swelling Denies abdominal pain, nausea, vomiting,diarrhea or constipation.   Denies dysuria, frequency, hesitancy or incontinence. Denies joint pain, swelling and limitation in mobility. Denies headaches, seizures, numbness, or tingling. Denies depression, anxiety or insomnia. Denies skin break down or rash.   PE  BP 130/84   Pulse 79   Resp 16   Ht 5\' 2"  (1.575 m)   Wt 251 lb (113.9 kg)   SpO2 96%   BMI 45.91 kg/m   Patient alert and oriented and in no cardiopulmonary distress.  HEENT: No facial asymmetry, EOMI,   oropharynx pink and moist.  Neck supple no JVD, no mass.  Chest: Clear to auscultation bilaterally.  CVS: S1, S2 no murmurs, no S3.Regular rate.  ABD: Soft non tender.   Ext: No edema  MS: Adequate ROM spine, shoulders, hips and knees.  Skin: Intact, no ulcerations or rash noted.  Psych: Good eye contact, normal affect. Memory intact not anxious or depressed appearing.  CNS: CN 2-12 intact, power,  normal throughout.no focal deficits  noted.   Assessment & Plan  Essential hypertension Controlled, no change in medication DASH diet and commitment to daily physical activity for a minimum of 30 minutes discussed and encouraged, as a part of hypertension management. The importance of attaining a healthy weight is also discussed.  BP/Weight 10/06/2016 09/22/2016 07/03/2016 04/16/2016 01/03/2016 12/29/7515 0/0/1749  Systolic BP 449 675 916 384 665 993 570  Diastolic BP 84 68 177 82 86 84 82  Wt. (Lbs) 251 - 246.7 242 244.8 - 254  BMI 45.91 - 45.12 44.26 44.77 - 46.45       Diabetes mellitus type 2 in obese (HCC) Controlled, no change in medication Caitlin Sampson is reminded of the importance of commitment to daily physical activity for 30 minutes or more, as able and the need to limit carbohydrate intake to 30 to 60 grams per meal to help with blood sugar control.   The need to take medication as prescribed, .   Caitlin Sampson is reminded of the importance of daily foot exam,  and good blood sugar, blood pressure and cholesterol control.  Diabetic Labs Latest Ref Rng & Units 10/01/2016 09/22/2016 04/14/2016 01/03/2016 11/19/2015  HbA1c 4.8 - 5.6 % 6.5(H) - 6.0(H) - 6.7(H)  Chol 100 - 199 mg/dL 175 - 155 - -  HDL >39 mg/dL 70 - 70 - -  Calc LDL 0 - 99 mg/dL 80 - 59 - -  Triglycerides 0 - 149 mg/dL 126 - 131 - -  Creatinine 0.44 - 1.00 mg/dL - 0.65 0.67 0.60 -   BP/Weight 10/06/2016 09/22/2016 07/03/2016 04/16/2016  01/03/2016 3/47/4259 09/21/3873  Systolic BP 643 329 518 841 660 630 160  Diastolic BP 84 68 109 82 86 84 82  Wt. (Lbs) 251 - 246.7 242 244.8 - 254  BMI 45.91 - 45.12 44.26 44.77 - 46.45   No flowsheet data found.      Rectal carcinoid tumor Refer to Dr Laural Golden for re evaluation for colonoscopy based on recommendation of GI Doc who diagnosed carcinoid in 2015  MORBID OBESITY Deteriorated. Patient re-educated about  the importance of commitment to a  minimum of 150 minutes of exercise per week.  The importance of  healthy food choices with portion control discussed. Encouraged to start a food diary, count calories and to consider  joining a support group. Sample diet sheets offered. Goals set by the patient for the next several months.   Weight /BMI 10/06/2016 07/03/2016 04/16/2016  WEIGHT 251 lb 246 lb 11.2 oz 242 lb  HEIGHT 5\' 2"  5\' 2"  5\' 2"   BMI 45.91 kg/m2 45.12 kg/m2 44.26 kg/m2   Progressive weight gain despite multiple attempts, gastric bypass is best for this pt, support info complete and faxed   Metabolic syndrome X The increased risk of cardiovascular disease associated with this diagnosis, and the need to consistently work on lifestyle to change this is discussed. Following  a  heart healthy diet ,commitment to 30 minutes of exercise at least 5 days per week, as well as control of blood sugar and cholesterol , and achieving a healthy weight are all the areas to be addressed .   Hyperlipidemia Hyperlipidemia:Low fat diet discussed and encouraged.   Lipid Panel  Lab Results  Component Value Date   CHOL 175 10/01/2016   HDL 70 10/01/2016   LDLCALC 80 10/01/2016   TRIG 126 10/01/2016   CHOLHDL 2.5 10/01/2016   Controlled, no change in medication     GAD (generalized anxiety disorder) Controlled, no change in medication   Encounter for examination following treatment at hospital Pt satisfied with negative cardiac workup, was also seen by cardiology in ED. Current focus is approval for gastric bypass and improved health as a result of weight loss. Denies any current or subsequent chest pain following the visit

## 2016-10-12 NOTE — Assessment & Plan Note (Signed)
Hyperlipidemia:Low fat diet discussed and encouraged.   Lipid Panel  Lab Results  Component Value Date   CHOL 175 10/01/2016   HDL 70 10/01/2016   LDLCALC 80 10/01/2016   TRIG 126 10/01/2016   CHOLHDL 2.5 10/01/2016   Controlled, no change in medication

## 2016-10-12 NOTE — Assessment & Plan Note (Signed)
Refer to Dr Laural Golden for re evaluation for colonoscopy based on recommendation of GI Doc who diagnosed carcinoid in 2015

## 2016-10-12 NOTE — Assessment & Plan Note (Signed)
Controlled, no change in medication DASH diet and commitment to daily physical activity for a minimum of 30 minutes discussed and encouraged, as a part of hypertension management. The importance of attaining a healthy weight is also discussed.  BP/Weight 10/06/2016 09/22/2016 07/03/2016 04/16/2016 01/03/2016 1/67/4255 06/23/8946  Systolic BP 347 583 074 600 298 473 085  Diastolic BP 84 68 694 82 86 84 82  Wt. (Lbs) 251 - 246.7 242 244.8 - 254  BMI 45.91 - 45.12 44.26 44.77 - 46.45

## 2016-10-12 NOTE — Assessment & Plan Note (Signed)
Controlled, no change in medication  

## 2016-10-12 NOTE — Assessment & Plan Note (Signed)
The increased risk of cardiovascular disease associated with this diagnosis, and the need to consistently work on lifestyle to change this is discussed. Following  a  heart healthy diet ,commitment to 30 minutes of exercise at least 5 days per week, as well as control of blood sugar and cholesterol , and achieving a healthy weight are all the areas to be addressed .  

## 2016-10-12 NOTE — Assessment & Plan Note (Addendum)
Controlled, no change in medication Caitlin Sampson is reminded of the importance of commitment to daily physical activity for 30 minutes or more, as able and the need to limit carbohydrate intake to 30 to 60 grams per meal to help with blood sugar control.   The need to take medication as prescribed, .   Caitlin Sampson is reminded of the importance of daily foot exam,  and good blood sugar, blood pressure and cholesterol control.  Diabetic Labs Latest Ref Rng & Units 10/01/2016 09/22/2016 04/14/2016 01/03/2016 11/19/2015  HbA1c 4.8 - 5.6 % 6.5(H) - 6.0(H) - 6.7(H)  Chol 100 - 199 mg/dL 175 - 155 - -  HDL >39 mg/dL 70 - 70 - -  Calc LDL 0 - 99 mg/dL 80 - 59 - -  Triglycerides 0 - 149 mg/dL 126 - 131 - -  Creatinine 0.44 - 1.00 mg/dL - 0.65 0.67 0.60 -   BP/Weight 10/06/2016 09/22/2016 07/03/2016 04/16/2016 01/03/2016 9/43/2003 11/24/4444  Systolic BP 190 122 241 146 431 427 670  Diastolic BP 84 68 110 82 86 84 82  Wt. (Lbs) 251 - 246.7 242 244.8 - 254  BMI 45.91 - 45.12 44.26 44.77 - 46.45   No flowsheet data found.

## 2016-10-12 NOTE — Assessment & Plan Note (Signed)
Deteriorated. Patient re-educated about  the importance of commitment to a  minimum of 150 minutes of exercise per week.  The importance of healthy food choices with portion control discussed. Encouraged to start a food diary, count calories and to consider  joining a support group. Sample diet sheets offered. Goals set by the patient for the next several months.   Weight /BMI 10/06/2016 07/03/2016 04/16/2016  WEIGHT 251 lb 246 lb 11.2 oz 242 lb  HEIGHT 5\' 2"  5\' 2"  5\' 2"   BMI 45.91 kg/m2 45.12 kg/m2 44.26 kg/m2   Progressive weight gain despite multiple attempts, gastric bypass is best for this pt, support info complete and faxed

## 2016-10-12 NOTE — Assessment & Plan Note (Signed)
Pt satisfied with negative cardiac workup, was also seen by cardiology in ED. Current focus is approval for gastric bypass and improved health as a result of weight loss. Denies any current or subsequent chest pain following the visit

## 2016-10-17 ENCOUNTER — Telehealth: Payer: Self-pay | Admitting: Family Medicine

## 2016-10-17 ENCOUNTER — Other Ambulatory Visit: Payer: Self-pay

## 2016-10-17 MED ORDER — PANTOPRAZOLE SODIUM 40 MG PO TBEC: 40.0000 mg | DELAYED_RELEASE_TABLET | Freq: Every day | ORAL | 3 refills | Status: DC

## 2016-10-17 NOTE — Telephone Encounter (Signed)
Form was faxed in and all meds are 90 days

## 2016-10-17 NOTE — Telephone Encounter (Signed)
Called patient and left message.

## 2016-10-17 NOTE — Telephone Encounter (Signed)
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  Also a letter of intent was done for weight loss surgery, but patient states they've not received it yet.   Pt's cb  [phone]

## 2016-10-22 ENCOUNTER — Ambulatory Visit (INDEPENDENT_AMBULATORY_CARE_PROVIDER_SITE_OTHER): Payer: 59 | Admitting: Internal Medicine

## 2016-10-28 ENCOUNTER — Telehealth: Payer: Self-pay

## 2016-10-28 NOTE — Telephone Encounter (Signed)
Pt called asking if she can see a dietician at [facility]?

## 2016-10-28 NOTE — Telephone Encounter (Signed)
pls refer to nutritionist here in [place], d

## 2016-10-30 ENCOUNTER — Telehealth: Payer: Self-pay | Admitting: Family Medicine

## 2016-10-30 DIAGNOSIS — E669 Obesity, unspecified: Principal | ICD-10-CM

## 2016-10-30 DIAGNOSIS — E1169 Type 2 diabetes mellitus with other specified complication: Secondary | ICD-10-CM

## 2016-10-30 NOTE — Telephone Encounter (Signed)
She has been referred

## 2016-10-30 NOTE — Telephone Encounter (Signed)
Patient left message on voice mail 10-29-16 @ 5:13pm.  She wants to know if you can go ahead and get her referred to the nutritionist or dietician at the hospital.  Cell [phone] Work [phone]

## 2016-11-03 NOTE — Telephone Encounter (Signed)
Referred.

## 2016-11-13 ENCOUNTER — Encounter: Payer: 59 | Attending: Family Medicine | Admitting: Skilled Nursing Facility1

## 2016-11-13 ENCOUNTER — Encounter: Payer: Self-pay | Admitting: Skilled Nursing Facility1

## 2016-11-13 DIAGNOSIS — Z713 Dietary counseling and surveillance: Secondary | ICD-10-CM | POA: Diagnosis present

## 2016-11-13 DIAGNOSIS — Z6841 Body Mass Index (BMI) 40.0 and over, adult: Secondary | ICD-10-CM | POA: Diagnosis not present

## 2016-11-13 DIAGNOSIS — E669 Obesity, unspecified: Secondary | ICD-10-CM

## 2016-11-13 DIAGNOSIS — E1169 Type 2 diabetes mellitus with other specified complication: Secondary | ICD-10-CM | POA: Diagnosis not present

## 2016-11-13 NOTE — Patient Instructions (Addendum)
-  Once a day or a couple times a week: always above 70; check either before you have eaten anything and 2 hours after a meal   -Log those numbers   -fasting numbers: under 130  -2 hours after you have eaten: under 180  -Try to have intentional movement every day  -Eat within an hour/hour and a half within waking   -Try kefir in the yogurt aisle or kefir cups

## 2016-11-13 NOTE — Progress Notes (Signed)
Appt start time: 4:36 end time:    Assessment:   1 st SWL Appointment.   Pt states she has GERD which subsides with medication. Pt states she needs to lose weight in order to control her diabetes and is thinking about getting the sleeve gastrectomy. Pts A1C is 6.5. Pt states she has been taking miralax every morning. Pt states she is lactose intolerant.  Pt will have her assessment next month. Was given the pre-op goals sheet to look over for next month.  Send this note to the surgeon when she has a referral from [place].  Start Wt at NDES: 252 Wt: 252 BMI: 46.09  MEDICATIONS: See List   DIETARY INTAKE:  24-hr recall:  B ( 8AM): bacon and eggs sometimes a fast food biscuit Snk ( AM):  L ( PM): bbq chicken, squash and onions Snk ( PM):  D ( PM): grilled salmon and baked potato Snk ( PM): swiss cake roll Beverages: diet green tea, diet ocean spray cranberry juice, sweet tea, diet coke, dr. Malachi Bonds  Usual physical activity: ADL's  Diet to Follow: 1500 calories 170 g carbohydrates 112 g protein 42 g fat   Nutritional Diagnosis:  [place]-3.3 Overweight/obesity related to past poor dietary habits and physical inactivity as evidenced by patient w/ recent sleeve surgery following dietary guidelines for continued weight loss.    Intervention:  Nutrition counseling for upcoming Bariatric Surgery. Goals: -Once a day or a couple times a week: always above 70; check either before you have eaten anything and 2 hours after a meal  -Log those numbers  -fasting numbers: under 130 -2 hours after you have eaten: under 180 -Try to have intentional movement every day -Eat within an hour/hour and a half within waking  -Try kefir in the yogurt aisle or kefir cups  Teaching Method Utilized:  Visual Auditory Hands on  Barriers to learning/adherence to lifestyle change: none identified   Demonstrated degree of understanding via:  Teach Back   Monitoring/Evaluation:  Dietary intake, exercise, and  body weight prn.

## 2016-11-27 ENCOUNTER — Other Ambulatory Visit (HOSPITAL_COMMUNITY): Payer: Self-pay | Admitting: Surgery

## 2016-11-28 ENCOUNTER — Other Ambulatory Visit: Payer: Self-pay | Admitting: Surgery

## 2016-12-02 ENCOUNTER — Encounter: Payer: Self-pay | Admitting: Skilled Nursing Facility1

## 2016-12-02 ENCOUNTER — Encounter: Payer: 59 | Attending: Family Medicine | Admitting: Skilled Nursing Facility1

## 2016-12-02 DIAGNOSIS — Z713 Dietary counseling and surveillance: Secondary | ICD-10-CM | POA: Diagnosis present

## 2016-12-02 DIAGNOSIS — Z6841 Body Mass Index (BMI) 40.0 and over, adult: Secondary | ICD-10-CM | POA: Diagnosis not present

## 2016-12-02 DIAGNOSIS — E1169 Type 2 diabetes mellitus with other specified complication: Secondary | ICD-10-CM | POA: Insufficient documentation

## 2016-12-02 DIAGNOSIS — E669 Obesity, unspecified: Secondary | ICD-10-CM

## 2016-12-02 NOTE — Patient Instructions (Addendum)
-  Get new strips  -Throw your used needles in a plastic container like a tide bottle or cascade bottle  -prick the side of your finger   -Read your chocolate label   -Work on your goal  -Find your meter

## 2016-12-02 NOTE — Progress Notes (Signed)
  Assessment:   Sleeve Gastrectomy   Pt states she is not consistent with her metformin. Pt states she is concerned for her husband possibly having a new pop up of cancer. Pt states she likes the premier protein shakes. Pt states she stopped drinking coffee and now does not have flarups of colitis.    Start Wt at NDES: 252 Wt: 249.4 BMI: 46.09  MEDICATIONS: See List   DIETARY INTAKE:  24-hr recall:  B ( 8AM): bacon and eggs sometimes a fast food biscuit Snk ( AM):  L ( PM): bbq chicken, squash and onions Snk ( PM):  D ( PM): grilled salmon and baked potato Snk ( PM): swiss cake roll, some kind of chocolate Beverages: diet green tea, diet ocean spray cranberry juice, sweet tea, diet coke, dr. Malachi Bonds  Usual physical activity: ADL's  Diet to Follow: 1500 calories 170 g carbohydrates 112 g protein 42 g fat   Nutritional Diagnosis:  [place]-3.3 Overweight/obesity related to past poor dietary habits and physical inactivity as evidenced by patient w/ recent sleeve surgery following dietary guidelines for continued weight loss.    Intervention:  Nutrition counseling for upcoming Bariatric Surgery. Goals: -Get new strips -Throw your used needles in a plastic container like a tide bottle or cascade bottle -prick the side of your finger  -Read your chocolate label  -Work on your goal -Find your meter Teaching Method Utilized:  Visual Auditory Hands on  Barriers to learning/adherence to lifestyle change: none identified   Demonstrated degree of understanding via:  Teach Back   Monitoring/Evaluation:  Dietary intake, exercise, and body weight prn.

## 2016-12-05 ENCOUNTER — Ambulatory Visit (HOSPITAL_COMMUNITY): Payer: 59

## 2016-12-05 ENCOUNTER — Other Ambulatory Visit (HOSPITAL_COMMUNITY): Payer: 59

## 2016-12-08 ENCOUNTER — Ambulatory Visit (HOSPITAL_COMMUNITY): Payer: 59

## 2016-12-08 ENCOUNTER — Other Ambulatory Visit (HOSPITAL_COMMUNITY): Payer: 59

## 2016-12-24 ENCOUNTER — Encounter: Payer: 59 | Attending: Family Medicine | Admitting: Skilled Nursing Facility1

## 2016-12-24 DIAGNOSIS — Z6841 Body Mass Index (BMI) 40.0 and over, adult: Secondary | ICD-10-CM | POA: Diagnosis not present

## 2016-12-24 DIAGNOSIS — Z713 Dietary counseling and surveillance: Secondary | ICD-10-CM | POA: Insufficient documentation

## 2016-12-24 DIAGNOSIS — E1169 Type 2 diabetes mellitus with other specified complication: Secondary | ICD-10-CM

## 2016-12-24 DIAGNOSIS — E669 Obesity, unspecified: Secondary | ICD-10-CM

## 2016-12-25 ENCOUNTER — Encounter: Payer: Self-pay | Admitting: Skilled Nursing Facility1

## 2016-12-25 NOTE — Progress Notes (Signed)
  Assessment:   Sleeve Gastrectomy  Pt states she is concerned for her husband possibly having a new pop up of cancer. Pt states she likes the premier protein shakes. Pt states she stopped drinking coffee and now does not have flarups of colitis.    Pt states she has been checking her blood sugars: fasting: 106, 117, 109 and 2 hours after a meal: 103, 96, 95. Pt states she is currently taking pain medication for the beginnings of a colitis flair up. Pt states she is not into kefir. Pt states not drinking with meals is not a problem.    Start Wt at NDES: 252 Wt: 249.4 BMI: 46.09  MEDICATIONS: See List   DIETARY INTAKE:  24-hr recall:  B ( 8AM): premier protein or 2 boiled eggs Snk ( AM):  L ( PM): bbq chicken, squash and onions Snk ( PM):  D ( PM): grilled salmon and greens Snk ( PM):  Beverages: diet green tea, water Usual physical activity: trying to move more: parking further away and taking the stairs   Diet to Follow: 1500 calories 170 g carbohydrates 112 g protein 42 g fat   Nutritional Diagnosis:  [place]-3.3 Overweight/obesity related to past poor dietary habits and physical inactivity as evidenced by patient w/ recent sleeve surgery following dietary guidelines for continued weight loss.    Intervention:  Nutrition counseling for upcoming Bariatric Surgery. Goals: -eat complex carbohydrates with your meals -keep working on chewing well -Sip on water throughout the day -Aim for 150 minutes of activity each week including cardio and weight bearing Teaching Method Utilized:  Visual Auditory Hands on  Barriers to learning/adherence to lifestyle change: none identified   Demonstrated degree of understanding via:  Teach Back   Monitoring/Evaluation:  Dietary intake, exercise, and body weight prn.

## 2017-01-02 ENCOUNTER — Ambulatory Visit (HOSPITAL_COMMUNITY)
Admission: RE | Admit: 2017-01-02 | Discharge: 2017-01-02 | Disposition: A | Discharge status: Home / Self Care | Payer: 59 | Source: Ambulatory Visit | Attending: Surgery | Admitting: Surgery

## 2017-01-02 DIAGNOSIS — Z01818 Encounter for other preprocedural examination: Secondary | ICD-10-CM | POA: Insufficient documentation

## 2017-01-02 DIAGNOSIS — K5790 Diverticulosis of intestine, part unspecified, without perforation or abscess without bleeding: Secondary | ICD-10-CM | POA: Diagnosis not present

## 2017-01-22 ENCOUNTER — Encounter: Payer: 59 | Attending: Family Medicine | Admitting: Registered"

## 2017-01-22 ENCOUNTER — Encounter: Payer: Self-pay | Admitting: Registered"

## 2017-01-22 DIAGNOSIS — Z713 Dietary counseling and surveillance: Secondary | ICD-10-CM | POA: Diagnosis present

## 2017-01-22 DIAGNOSIS — Z6841 Body Mass Index (BMI) 40.0 and over, adult: Secondary | ICD-10-CM | POA: Insufficient documentation

## 2017-01-22 DIAGNOSIS — E1169 Type 2 diabetes mellitus with other specified complication: Secondary | ICD-10-CM | POA: Diagnosis not present

## 2017-01-22 DIAGNOSIS — E119 Type 2 diabetes mellitus without complications: Secondary | ICD-10-CM

## 2017-01-22 LAB — BMP8+EGFR
BUN / CREAT RATIO: 16 (ref 9–23)
BUN: 11 mg/dL (ref 6–24)
CO2: 28 mmol/L (ref 20–29)
CREATININE: 0.67 mg/dL (ref 0.57–1.00)
Calcium: 10.7 mg/dL — ABNORMAL HIGH (ref 8.7–10.2)
Chloride: 98 mmol/L (ref 96–106)
GFR, EST AFRICAN AMERICAN: 115 mL/min/{1.73_m2} (ref >59)
GFR, EST NON AFRICAN AMERICAN: 100 mL/min/{1.73_m2} (ref >59)
Glucose: 116 mg/dL — ABNORMAL HIGH (ref 65–99)
Potassium: 3.7 mmol/L (ref 3.5–5.2)
Sodium: 141 mmol/L (ref 134–144)

## 2017-01-22 LAB — HEMOGLOBIN A1C
Est. average glucose Bld gHb Est-mCnc: 137 mg/dL
Hgb A1c MFr Bld: 6.4 % — ABNORMAL HIGH (ref 4.8–5.6)

## 2017-01-22 NOTE — Patient Instructions (Addendum)
-   Continue to work on chewing well.   - Aim to not drink 15 min before, during, and waiting 30 min after eating meals and snacks.  - Track fluid intake and aim for at least 64 oz a day.    - Try to exercise with DVDs in the morning before getting your day started for 15 min, 3 days/week.

## 2017-01-22 NOTE — Progress Notes (Signed)
Assessment: Sleeve Gastrectomy  3rd SWL visit  Start time: 4:42           End time: 5:08  Surgery expectations: Wants to improve health, reduce medications, feel better   Pt arrives having maintained from previous visit. Pt  states she has been checking her blood sugar 3x/week: fasting (109-124) and 2 hours after a meal (109). Pt states she is working on chewing well and not drinking out of straws. Pt states she is mindful of aiming for more fluid during her workday but unsure if she is gettign 64 fluid ounces per day. Pt states she likes chocolate and peaches and cream flavored Premier Protein. Pt states she wants to be consistent with physical activity and may be joining a local gym with a friend.  Pt states she is concerned for her husband possibly having a new pop up of cancer. Pt states she likes the premier protein shakes. Pt states she stopped drinking coffee and now does not have flarups of colitis.   Pt states she is currently taking pain medication for the beginnings of a colitis flair up. Pt states she is not into kefir. Pt states not drinking with meals is not a problem.    Start Wt at NDES: 252 Wt: 248.5 BMI: 45.47  MEDICATIONS: See List   DIETARY INTAKE:  24-hr recall:  B ( 8AM): premier protein or 2 boiled eggs Snk ( AM):  L ( PM): bbq chicken, squash and onions Snk ( PM):  D ( PM): grilled salmon and greens Snk ( PM):  Beverages: diet green tea, water  Usual physical activity: trying to move more: parking further away and taking the stairs   Diet to Follow: 1500 calories 170 g carbohydrates 112 g protein 42 g fat   Nutritional Diagnosis:  [place]-3.3 Overweight/obesity related to past poor dietary habits and physical inactivity as evidenced by patient w/ recent sleeve surgery following dietary guidelines for continued weight loss.    Intervention:  Nutrition counseling for upcoming Bariatric Surgery. Goals: - Continue to work on chewing well.  - Aim to not  drink 15 min before, during, and waiting 30 min after eating meals and snacks. - Track fluid intake and aim for at least 64 oz a day.   - Try to exercise with DVDs in the morning before getting your day started for 15 min, 3 days/week.  Teaching Method Utilized:  Visual Auditory Hands on  Barriers to learning/adherence to lifestyle change: none identified   Demonstrated degree of understanding via:  Teach Back   Monitoring/Evaluation:  Dietary intake, exercise, and body weight in 21 day(s).

## 2017-01-27 ENCOUNTER — Ambulatory Visit (INDEPENDENT_AMBULATORY_CARE_PROVIDER_SITE_OTHER): Payer: 59 | Admitting: Family Medicine

## 2017-01-27 ENCOUNTER — Encounter: Payer: Self-pay | Admitting: Family Medicine

## 2017-01-27 VITALS — BP 122/74 | HR 76 | Temp 98.5°F | Resp 16 | Ht 62.0 in | Wt 249.8 lb

## 2017-01-27 DIAGNOSIS — Z23 Encounter for immunization: Secondary | ICD-10-CM | POA: Diagnosis not present

## 2017-01-27 DIAGNOSIS — D3A026 Benign carcinoid tumor of the rectum: Secondary | ICD-10-CM

## 2017-01-27 DIAGNOSIS — E669 Obesity, unspecified: Secondary | ICD-10-CM

## 2017-01-27 DIAGNOSIS — F411 Generalized anxiety disorder: Secondary | ICD-10-CM

## 2017-01-27 DIAGNOSIS — E782 Mixed hyperlipidemia: Secondary | ICD-10-CM | POA: Diagnosis not present

## 2017-01-27 DIAGNOSIS — E1169 Type 2 diabetes mellitus with other specified complication: Secondary | ICD-10-CM

## 2017-01-27 DIAGNOSIS — I10 Essential (primary) hypertension: Secondary | ICD-10-CM | POA: Diagnosis not present

## 2017-01-27 MED ORDER — LOSARTAN POTASSIUM-HCTZ 100-12.5 MG PO TABS: 1.0000 | ORAL_TABLET | Freq: Every day | ORAL | 3 refills | Status: DC

## 2017-01-27 NOTE — Patient Instructions (Addendum)
F/u early February, call if you need me before  Nurse BP check first week in October  New for blood pressure is Hyzaar one daily take in place of valsartan/ HCTZ as temporarily discontinued  All the very best with surgery, this is best for you  Prevnar and flu vaccines today  Good foot exam  I ell refer you to [facility] for diabetic eye exam  willsend a message to Dr Rehman/ review his las note  Thank you  for choosing [facility]. We consider it a privelige to serve you.  Delivering excellent health care in a caring and  compassionate way is our goal.  Partnering with you,  so that together we can achieve this goal is our strategy.

## 2017-01-31 DIAGNOSIS — Z23 Encounter for immunization: Secondary | ICD-10-CM | POA: Insufficient documentation

## 2017-01-31 NOTE — Progress Notes (Signed)
[name]     MRN: [id number]      DOB: 13-Jun-1960   HPI [name] is here for follow up and re-evaluation of chronic medical conditions, medication management and review of any available recent lab and radiology data.  Preventive health is updated, specifically  Cancer screening and Immunization.   Questions or concerns regarding consultations or procedures which the PT has had in the interim are  addressed. The PT denies any adverse reactions to current medications since the last visit.  Has been approved for bariatric surgery and is excited about this from a health perspective ROS Denies recent fever or chills. Denies sinus pressure, nasal congestion, ear pain or sore throat. Denies chest congestion, productive cough or wheezing. Denies chest pains, palpitations and leg swelling Denies abdominal pain, nausea, vomiting,diarrhea or constipation.   Denies dysuria, frequency, hesitancy or incontinence. Denies joint pain, swelling and limitation in mobility. Denies headaches, seizures, numbness, or tingling. Denies depression, anxiety or insomnia. Denies skin break down or rash.   PE  BP 122/74 (BP Location: Left Arm, Patient Position: Sitting, Cuff Size: Normal)   Pulse 76   Temp 98.5 F (36.9 C) (Other (Comment))   Resp 16   Ht 5\' 2"  (1.575 m)   Wt 249 lb 12 oz (113.3 kg)   SpO2 98%   BMI 45.68 kg/m   Patient alert and oriented and in no cardiopulmonary distress.  HEENT: No facial asymmetry, EOMI,   oropharynx pink and moist.  Neck supple no JVD, no mass.  Chest: Clear to auscultation bilaterally.  CVS: S1, S2 no murmurs, no S3.Regular rate.  ABD: Soft non tender.   Ext: No edema  MS: Adequate ROM spine, shoulders, hips and knees.  Skin: Intact, no ulcerations or rash noted.  Psych: Good eye contact, normal affect. Memory intact not anxious or depressed appearing.  CNS: CN 2-12 intact, power,  normal throughout.no focal deficits noted.   Assessment &  Plan  Diabetes mellitus type 2 in obese (HCC) Controlled, no change in medication [name] is reminded of the importance of commitment to daily physical activity for 30 minutes or more, as able and the need to limit carbohydrate intake to 30 to 60 grams per meal to help with blood sugar control.   The need to take medication as prescribed, test blood sugar as directed, and to call between visits if there is a concern that blood sugar is uncontrolled is also discussed.   [name] is reminded of the importance of daily foot exam, annual eye examination, and good blood sugar, blood pressure and cholesterol control.  Diabetic Labs Latest Ref Rng & Units 01/21/2017 10/01/2016 09/22/2016 04/14/2016 01/03/2016  HbA1c 4.8 - 5.6 % 6.4(H) 6.5(H) - 6.0(H) -  Chol 100 - 199 mg/dL - 175 - 155 -  HDL >39 mg/dL - 70 - 70 -  Calc LDL 0 - 99 mg/dL - 80 - 59 -  Triglycerides 0 - 149 mg/dL - 126 - 131 -  Creatinine 0.57 - 1.00 mg/dL 0.67 - 0.65 0.67 0.60   BP/Weight 01/27/2017 01/22/2017 12/24/2016 12/02/2016 11/13/2016 09/18/7739 06/26/7865  Systolic BP 672 - - - - 094 709  Diastolic BP 74 - - - - 84 68  Wt. (Lbs) 249.75 248.6 251.2 249.4 252 251 -  BMI 45.68 45.47 45.95 45.62 46.09 45.91 -   Foot/eye exam completion dates 01/27/2017  Foot Form Completion Done        Need for vaccination with 13-polyvalent pneumococcal conjugate  vaccine After obtaining informed consent, the vaccine is  administered by LPN.   Hyperlipidemia Hyperlipidemia:Low fat diet discussed and encouraged. Controlled, no change in medication    Lipid Panel  Lab Results  Component Value Date   CHOL 175 10/01/2016   HDL 70 10/01/2016   LDLCALC 80 10/01/2016   TRIG 126 10/01/2016   CHOLHDL 2.5 10/01/2016    ]  GAD (generalized anxiety disorder) Controlled, no change in medication   Essential hypertension Controlled, medication changed due to the fact that diovan recently  Withdrawn from the market DASH diet and  commitment to daily physical activity for a minimum of 30 minutes discussed and encouraged, as a part of hypertension management. The importance of attaining a healthy weight is also discussed.  BP/Weight 01/27/2017 01/22/2017 12/24/2016 12/02/2016 11/13/2016 2/59/5638 11/21/6431  Systolic BP 295 - - - - 188 416  Diastolic BP 74 - - - - 84 68  Wt. (Lbs) 249.75 248.6 251.2 249.4 252 251 -  BMI 45.68 45.47 45.95 45.62 46.09 45.91 -       MORBID OBESITY Uncharged, on track for bariatric surgery, which she will benefit greatly from Patient re-educated about  the importance of commitment to a  minimum of 150 minutes of exercise per week.  The importance of healthy food choices with portion control discussed. Encouraged to start a food diary, count calories and to consider  joining a support group. Sample diet sheets offered. Goals set by the patient for the next several months.   Weight /BMI 01/27/2017 01/22/2017 12/24/2016  WEIGHT 249 lb 12 oz 248 lb 9.6 oz 251 lb 3.2 oz  HEIGHT 5\' 2"  - 5\' 2"   BMI 45.68 kg/m2 45.47 kg/m2 45.95 kg/m2

## 2017-01-31 NOTE — Assessment & Plan Note (Signed)
After obtaining informed consent, the vaccine is  administered by LPN.  

## 2017-01-31 NOTE — Assessment & Plan Note (Signed)
Controlled, no change in medication Caitlin Sampson is reminded of the importance of commitment to daily physical activity for 30 minutes or more, as able and the need to limit carbohydrate intake to 30 to 60 grams per meal to help with blood sugar control.   The need to take medication as prescribed, test blood sugar as directed, and to call between visits if there is a concern that blood sugar is uncontrolled is also discussed.   Caitlin Sampson is reminded of the importance of daily foot exam, annual eye examination, and good blood sugar, blood pressure and cholesterol control.  Diabetic Labs Latest Ref Rng & Units 01/21/2017 10/01/2016 09/22/2016 04/14/2016 01/03/2016  HbA1c 4.8 - 5.6 % 6.4(H) 6.5(H) - 6.0(H) -  Chol 100 - 199 mg/dL - 175 - 155 -  HDL >39 mg/dL - 70 - 70 -  Calc LDL 0 - 99 mg/dL - 80 - 59 -  Triglycerides 0 - 149 mg/dL - 126 - 131 -  Creatinine 0.57 - 1.00 mg/dL 0.67 - 0.65 0.67 0.60   BP/Weight 01/27/2017 01/22/2017 12/24/2016 12/02/2016 11/13/2016 3/61/4431 09/19/84  Systolic BP 761 - - - - 950 932  Diastolic BP 74 - - - - 84 68  Wt. (Lbs) 249.75 248.6 251.2 249.4 252 251 -  BMI 45.68 45.47 45.95 45.62 46.09 45.91 -   Foot/eye exam completion dates 01/27/2017  Foot Form Completion Done

## 2017-01-31 NOTE — Assessment & Plan Note (Signed)
Controlled, no change in medication  

## 2017-01-31 NOTE — Assessment & Plan Note (Addendum)
Hyperlipidemia:Low fat diet discussed and encouraged. Controlled, no change in medication    Lipid Panel  Lab Results  Component Value Date   CHOL 175 10/01/2016   HDL 70 10/01/2016   LDLCALC 80 10/01/2016   TRIG 126 10/01/2016   CHOLHDL 2.5 10/01/2016    ]

## 2017-01-31 NOTE — Assessment & Plan Note (Signed)
Controlled, medication changed due to the fact that diovan recently  Withdrawn from the market DASH diet and commitment to daily physical activity for a minimum of 30 minutes discussed and encouraged, as a part of hypertension management. The importance of attaining a healthy weight is also discussed.  BP/Weight 01/27/2017 01/22/2017 12/24/2016 12/02/2016 11/13/2016 9/82/6415 12/20/938  Systolic BP 768 - - - - 088 110  Diastolic BP 74 - - - - 84 68  Wt. (Lbs) 249.75 248.6 251.2 249.4 252 251 -  BMI 45.68 45.47 45.95 45.62 46.09 45.91 -

## 2017-01-31 NOTE — Assessment & Plan Note (Signed)
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

## 2017-01-31 NOTE — Assessment & Plan Note (Signed)
Uncharged, on track for bariatric surgery, which she will benefit greatly from Patient re-educated about  the importance of commitment to a  minimum of 150 minutes of exercise per week.  The importance of healthy food choices with portion control discussed. Encouraged to start a food diary, count calories and to consider  joining a support group. Sample diet sheets offered. Goals set by the patient for the next several months.   Weight /BMI 01/27/2017 01/22/2017 12/24/2016  WEIGHT 249 lb 12 oz 248 lb 9.6 oz 251 lb 3.2 oz  HEIGHT 5\' 2"  - 5\' 2"   BMI 45.68 kg/m2 45.47 kg/m2 45.95 kg/m2

## 2017-02-03 ENCOUNTER — Ambulatory Visit (INDEPENDENT_AMBULATORY_CARE_PROVIDER_SITE_OTHER): Payer: 59 | Admitting: Internal Medicine

## 2017-02-12 ENCOUNTER — Encounter: Payer: 59 | Admitting: Skilled Nursing Facility1

## 2017-02-12 DIAGNOSIS — E1169 Type 2 diabetes mellitus with other specified complication: Secondary | ICD-10-CM

## 2017-02-12 DIAGNOSIS — Z713 Dietary counseling and surveillance: Secondary | ICD-10-CM | POA: Diagnosis not present

## 2017-02-12 DIAGNOSIS — E669 Obesity, unspecified: Principal | ICD-10-CM

## 2017-02-12 NOTE — Patient Instructions (Signed)
-  listen to your body and learn what her satisfaction cues are

## 2017-02-12 NOTE — Progress Notes (Signed)
Assessment: Sleeve Gastrectomy  4th SWL visit  Start time: 4:42           End time: 5:08  Surgery expectations: Wants to improve health, reduce medications, feel better   Pt arrives having maintained from previous visit. Pt  states she has been checking her blood sugar 3x/week: fasting (109-124) and 2 hours after a meal (109). Pt states she is working on chewing well and not drinking out of straws. Pt states she is mindful of aiming for more fluid during her workday but unsure if she is gettign 64 fluid ounces per day. Pt states she likes chocolate and peaches and cream flavored Premier Protein. Pt states she wants to be consistent with physical activity and may be joining a local gym with a friend.  Pt states she is concerned for her husband possibly having a new pop up of cancer. Pt states she likes the premier protein shakes. Pt states she stopped drinking coffee and now does not have flarups of colitis.   Pt states she is currently taking pain medication for the beginnings of a colitis flair up. Pt states she is not into kefir. Pt states not drinking with meals is not a problem.    Pt arrives having gained about 3 pounds. Pt states she is now taking losarten-HTZ instead of valsartin due to a recall. Pt states she has not been working out as much as she wants to due to taking care of her father in [employer]. Pt states she attended support group and enjoyed it. Pt states she has finished weight loss for dummies and liked it. Pt states she likes her crock pot meals. Pt states she only needs one more visit.    Start Wt at [facility]: 252 Wt: 251.3 BMI: 45.96  MEDICATIONS: See List   DIETARY INTAKE:  24-hr recall:  B ( 8AM): premier protein or 2 boiled eggs Snk ( AM):  L ( PM): bbq chicken, squash and onions Snk ( PM):  D ( PM): grilled salmon and greens Snk ( PM):  Beverages: diet green tea, water  Usual physical activity: trying to move more: parking further away and taking the stairs   Diet  to Follow: 1500 calories 170 g carbohydrates 112 g protein 42 g fat   Nutritional Diagnosis:  [place]-3.3 Overweight/obesity related to past poor dietary habits and physical inactivity as evidenced by patient w/ recent sleeve surgery following dietary guidelines for continued weight loss.    Intervention:  Nutrition counseling for upcoming Bariatric Surgery. Goals: - Continue to work on chewing well.  - Aim to not drink 15 min before, during, and waiting 30 min after eating meals and snacks. - Track fluid intake and aim for at least 64 oz a day.   - Try to exercise with DVDs in the morning before getting your day started for 15 min, 3 days/week. -listen to your body and learn what her satisfaction cues are  Teaching Method Utilized:  Visual Auditory Hands on  Barriers to learning/adherence to lifestyle change: none identified   Demonstrated degree of understanding via:  Teach Back   Monitoring/Evaluation:  Dietary intake, exercise, and body weight in 21 day(s).

## 2017-02-15 ENCOUNTER — Other Ambulatory Visit: Payer: Self-pay | Admitting: Family Medicine

## 2017-02-16 ENCOUNTER — Ambulatory Visit: Payer: 59

## 2017-02-16 VITALS — BP 128/78

## 2017-02-16 DIAGNOSIS — I10 Essential (primary) hypertension: Secondary | ICD-10-CM

## 2017-02-23 ENCOUNTER — Other Ambulatory Visit: Payer: Self-pay

## 2017-02-23 MED ORDER — LOSARTAN POTASSIUM-HCTZ 100-12.5 MG PO TABS: 1.0000 | ORAL_TABLET | Freq: Every day | ORAL | 3 refills | Status: DC

## 2017-02-24 ENCOUNTER — Telehealth: Payer: Self-pay | Admitting: Family Medicine

## 2017-02-24 MED ORDER — LOSARTAN POTASSIUM-HCTZ 100-12.5 MG PO TABS: 1.0000 | ORAL_TABLET | Freq: Every day | ORAL | 3 refills | Status: DC

## 2017-02-24 NOTE — Telephone Encounter (Signed)
Patient calling wanting to make sure that her Rx for Losartan is called into [facility] (90 day supply) and not [facility].  She normally receives email when this is done and she has yet to receive.

## 2017-03-09 ENCOUNTER — Ambulatory Visit: Payer: 59 | Admitting: Skilled Nursing Facility1

## 2017-03-10 ENCOUNTER — Encounter: Payer: 59 | Attending: Family Medicine | Admitting: Skilled Nursing Facility1

## 2017-03-10 ENCOUNTER — Encounter: Payer: Self-pay | Admitting: Skilled Nursing Facility1

## 2017-03-10 DIAGNOSIS — Z6841 Body Mass Index (BMI) 40.0 and over, adult: Secondary | ICD-10-CM | POA: Insufficient documentation

## 2017-03-10 DIAGNOSIS — E669 Obesity, unspecified: Secondary | ICD-10-CM

## 2017-03-10 DIAGNOSIS — Z713 Dietary counseling and surveillance: Secondary | ICD-10-CM | POA: Diagnosis present

## 2017-03-10 DIAGNOSIS — E1169 Type 2 diabetes mellitus with other specified complication: Secondary | ICD-10-CM | POA: Insufficient documentation

## 2017-03-10 NOTE — Progress Notes (Signed)
Assessment: Sleeve Gastrectomy  5th SWL visit  Start time: 4:42           End time: 5:08  Surgery expectations: Wants to improve health, reduce medications, feel better   Pt states she worries about having diverticulitis flarups after surgery. Pt states she thinks the hardest habit after surgery is going to be not using a straw and states she is really looking forward to being healthy. Pt staets she has been trying to not eat in front of the tv and has been portioning out her chips instead of eating out of the whole bag.  Start Wt at [facility]: 252 Wt: 252 BMI: 45.96  MEDICATIONS: See List   DIETARY INTAKE:  24-hr recall:  B ( 8AM): premier protein or 2 boiled eggs Snk ( AM): fruit L ( PM): pizza Snk ( PM): greek yogurt D ( PM): grilled salmon and greens Snk ( PM): chips Beverages: diet green tea, water  Usual physical activity: trying to move more: parking further away and taking the stairs   Diet to Follow: 1500 calories 170 g carbohydrates 112 g protein 42 g fat   Nutritional Diagnosis:  [place]-3.3 Overweight/obesity related to past poor dietary habits and physical inactivity as evidenced by patient w/ recent sleeve surgery following dietary guidelines for continued weight loss.    Intervention:  Nutrition counseling for upcoming Bariatric Surgery. Goals: - Continue to work on chewing well.  - Aim to not drink 15 min before, during, and waiting 30 min after eating meals and snacks. - Track fluid intake and aim for at least 64 oz a day.   - Try to exercise with DVDs in the morning before getting your day started for 15 min, 3 days/week. -listen to your body and learn what her satisfaction cues are  Teaching Method Utilized:  Visual Auditory Hands on  Barriers to learning/adherence to lifestyle change: none identified   Demonstrated degree of understanding via:  Teach Back   Monitoring/Evaluation:  Dietary intake, exercise, and body weight in 21 day(s).

## 2017-03-23 ENCOUNTER — Encounter: Payer: 59 | Attending: Family Medicine | Admitting: Registered"

## 2017-03-23 DIAGNOSIS — Z6841 Body Mass Index (BMI) 40.0 and over, adult: Secondary | ICD-10-CM | POA: Insufficient documentation

## 2017-03-23 DIAGNOSIS — Z713 Dietary counseling and surveillance: Secondary | ICD-10-CM | POA: Diagnosis present

## 2017-03-23 DIAGNOSIS — E1169 Type 2 diabetes mellitus with other specified complication: Secondary | ICD-10-CM | POA: Insufficient documentation

## 2017-03-23 DIAGNOSIS — E119 Type 2 diabetes mellitus without complications: Secondary | ICD-10-CM

## 2017-03-23 NOTE — Progress Notes (Signed)
  Pre-Operative Nutrition Class:  Appt start time: 8:15   End time:  9:15.  Patient was seen on 03/23/2017 for Pre-Operative Bariatric Surgery Education at [facility].   Surgery date: TBD Surgery type: Sleeve  [facility] weight at [facility]: 252.0 Weight today: 251.6   Samples given per MNT protocol. Patient educated on appropriate usage: Bariatric Advantage Multivitamin Lot # [id number] Exp: 11/2017  Bariatric Advantage Calcium Citrate Lot # [id number] Exp: 02/19/2018  Renee Pain Protein Shake Lot # [id number] Exp: 10/13/2017   The following the learning objectives were met by the patient during this course:  Identify Pre-Op Dietary Goals and will begin 2 weeks pre-operatively  Identify appropriate sources of fluids and proteins   State protein recommendations and appropriate sources pre and post-operatively  Identify Post-Operative Dietary Goals and will follow for 2 weeks post-operatively  Identify appropriate multivitamin and calcium sources  Describe the need for physical activity post-operatively and will follow MD recommendations  State when to call healthcare provider regarding medication questions or post-operative complications  Handouts given during class include:  Pre-Op Bariatric Surgery Diet Handout  Protein Shake Handout  Post-Op Bariatric Surgery Nutrition Handout  BELT Program Information Flyer  Support Group Information Flyer  [facility] Bariatric Supplements Price List  Follow-Up Plan: Patient will follow-up at [facility] 2 weeks post operatively for diet advancement per MD.

## 2017-04-22 ENCOUNTER — Ambulatory Visit: Payer: Self-pay | Admitting: Surgery

## 2017-04-22 NOTE — H&P (View-Only) (Signed)
Chief Complaint:  BMI 45 with DM  History of Present Illness:  Caitlin Sampson is an 56 y.o. female who was seen last summer as a bariatric consult interested in having sleeve gastrectomy.  She has GER but UGI showed no hiatal hernia.  Questions answered and she is ready to proceed with lap sleeve gastrectomy.    Prior surgery included hysterectomy and cholecystectomy.  No hx of DVT  Past Medical History:  Diagnosis Date  . Anxiety   . Diverticulosis of colon 2012  . GERD (gastroesophageal reflux disease)   . Headache   . Hyperlipidemia   . Hypertension   . IDA (iron deficiency anemia)   . Poison oak dermatitis 2012    Past Surgical History:  Procedure Laterality Date  . ABDOMINAL HYSTERECTOMY    . CARDIAC CATHETERIZATION  09/05/03   EF 57%. NORMAL CORONARY ARTERIES. NO EVIDENCE OF RENAL ARTERY STENOSIS.  Marland Kitchen CHOLECYSTECTOMY    . ESOPHAGOGASTRODUODENOSCOPY N/A 10/17/2015   Procedure: ESOPHAGOGASTRODUODENOSCOPY (EGD);  Surgeon: Rogene Houston, MD;  Location: [facility];  Service: Endoscopy;  Laterality: N/A;  3:00  . MYOCARDIAL PERFUSION STUDY  03/30/09   NORMAL. EF 76%.  . TRANSTHORACIC ECHOCARDIOGRAM  10/10/08   NORMAL. EF => 55%.    Current Outpatient Medications  Medication Sig Dispense Refill  . aspirin 81 MG tablet Take 81 mg by mouth daily.      Marland Kitchen azelastine (ASTELIN) 0.1 % nasal spray Place 2 sprays into both nostrils 2 (two) times daily. Use in each nostril as directed (Patient taking differently: Place 2 sprays into both nostrils 2 (two) times daily as needed for rhinitis or allergies. Use in each nostril as directed) 90 mL 3  . FLUoxetine (PROZAC) 40 MG capsule TAKE 1 CAPSULE BY MOUTH  DAILY 90 capsule 0  . losartan-hydrochlorothiazide (HYZAAR) 100-12.5 MG tablet Take 1 tablet by mouth daily. 30 tablet 3  . metFORMIN (GLUCOPHAGE) 500 MG tablet Take 1 tablet (500 mg total) by mouth daily with breakfast. 90 tablet 3  . pantoprazole (PROTONIX) 40 MG tablet Take 1 tablet  (40 mg total) by mouth daily. 90 tablet 3  . potassium chloride (K-DUR,KLOR-CON) 10 MEQ tablet Take 1 tablet (10 mEq total) by mouth daily. 90 tablet 1  . potassium chloride (KLOR-CON 10) 10 MEQ tablet Take 1 tablet (10 mEq total) by mouth 2 (two) times daily. 180 tablet 3  . rosuvastatin (CRESTOR) 10 MG tablet TAKE 1 TABLET BY MOUTH  DAILY 90 tablet 1   No current facility-administered medications for this visit.    Vytorin [ezetimibe-simvastatin] Family History  Problem Relation Age of Onset  . Heart disease Mother        a fib  . Kidney disease Mother   . Heart disease Father        MI  . Hyperlipidemia Father   . Hypertension Father   . Kidney disease Father   . Kidney disease Brother   . Diabetes Maternal Grandmother   . Stroke Maternal Grandmother    Social History:   reports that she has quit smoking. she has never used smokeless tobacco. She reports that she does not drink alcohol or use drugs.   REVIEW OF SYSTEMS : Negative except for see problem list  Physical Exam:   There were no vitals taken for this visit. There is no height or weight on file to calculate BMI.  BMI 45  Gen:  WDWN WF NAD  Neurological: Alert and oriented to person, place, and  time. Motor and sensory function is grossly intact  Head: Normocephalic and atraumatic.  Eyes: Conjunctivae are normal. Pupils are equal, round, and reactive to light. No scleral icterus.  Neck: Normal range of motion. Neck supple. No tracheal deviation or thyromegaly present.  Cardiovascular:  SR without murmurs or gallops.  No carotid bruits Breast:  Not examined Respiratory: Effort normal.  No respiratory distress. No chest wall tenderness. Breath sounds normal.  No wheezes, rales or rhonchi.  Abdomen:  nontender GU:  Not examined Musculoskeletal: Normal range of motion. Extremities are nontender. No cyanosis, edema or clubbing noted Lymphadenopathy: No cervical, preauricular, postauricular or axillary adenopathy is  present Skin: Skin is warm and dry. No rash noted. No diaphoresis. No erythema. No pallor. Pscyh: Normal mood and affect. Behavior is normal. Judgment and thought content normal.   LABORATORY RESULTS: No results found for this or any previous visit (from the past 48 hour(s)).   RADIOLOGY RESULTS: No results found.  Problem List: Patient Active Problem List   Diagnosis Date Noted  . Need for vaccination with 13-polyvalent pneumococcal conjugate vaccine 01/31/2017  . Diabetes mellitus type 2 in obese ([place]) 10/10/2016  . Rectal carcinoid tumor 08/19/2015  . Internal hemorrhoids 08/19/2015  . Diverticular disease of colon 08/19/2015  . IDA (iron deficiency anemia) 08/14/2015  . Essential hypertension 02/22/2014  . Metabolic syndrome X 33/00/7622  . GAD (generalized anxiety disorder) 10/30/2011  . Hyperlipidemia 04/29/2006  . MORBID OBESITY 04/29/2006  . GERD 04/29/2006  . IBS 04/29/2006  . CALCULUS, KIDNEY 04/29/2006    Assessment & Plan: DM and hypertension and morbid obesity with BMI>45;  Plan sleeve gastrectomy.      Matt B. Hassell Done, MD, [facility] [phone] beeper [phone]  04/22/2017 4:22 PM

## 2017-04-22 NOTE — H&P (Signed)
Chief Complaint:  BMI 45 with DM  History of Present Illness:  Caitlin Sampson is an 56 y.o. female who was seen last summer as a bariatric consult interested in having sleeve gastrectomy.  She has GER but UGI showed no hiatal hernia.  Questions answered and she is ready to proceed with lap sleeve gastrectomy.    Prior surgery included hysterectomy and cholecystectomy.  No hx of DVT  Past Medical History:  Diagnosis Date  . Anxiety   . Diverticulosis of colon 2012  . GERD (gastroesophageal reflux disease)   . Headache   . Hyperlipidemia   . Hypertension   . IDA (iron deficiency anemia)   . Poison oak dermatitis 2012    Past Surgical History:  Procedure Laterality Date  . ABDOMINAL HYSTERECTOMY    . CARDIAC CATHETERIZATION  09/05/03   EF 57%. NORMAL CORONARY ARTERIES. NO EVIDENCE OF RENAL ARTERY STENOSIS.  Marland Kitchen CHOLECYSTECTOMY    . ESOPHAGOGASTRODUODENOSCOPY N/A 10/17/2015   Procedure: ESOPHAGOGASTRODUODENOSCOPY (EGD);  Surgeon: Rogene Houston, MD;  Location: [facility];  Service: Endoscopy;  Laterality: N/A;  3:00  . MYOCARDIAL PERFUSION STUDY  03/30/09   NORMAL. EF 76%.  . TRANSTHORACIC ECHOCARDIOGRAM  10/10/08   NORMAL. EF => 55%.    Current Outpatient Medications  Medication Sig Dispense Refill  . aspirin 81 MG tablet Take 81 mg by mouth daily.      Marland Kitchen azelastine (ASTELIN) 0.1 % nasal spray Place 2 sprays into both nostrils 2 (two) times daily. Use in each nostril as directed (Patient taking differently: Place 2 sprays into both nostrils 2 (two) times daily as needed for rhinitis or allergies. Use in each nostril as directed) 90 mL 3  . FLUoxetine (PROZAC) 40 MG capsule TAKE 1 CAPSULE BY MOUTH  DAILY 90 capsule 0  . losartan-hydrochlorothiazide (HYZAAR) 100-12.5 MG tablet Take 1 tablet by mouth daily. 30 tablet 3  . metFORMIN (GLUCOPHAGE) 500 MG tablet Take 1 tablet (500 mg total) by mouth daily with breakfast. 90 tablet 3  . pantoprazole (PROTONIX) 40 MG tablet Take 1 tablet  (40 mg total) by mouth daily. 90 tablet 3  . potassium chloride (K-DUR,KLOR-CON) 10 MEQ tablet Take 1 tablet (10 mEq total) by mouth daily. 90 tablet 1  . potassium chloride (KLOR-CON 10) 10 MEQ tablet Take 1 tablet (10 mEq total) by mouth 2 (two) times daily. 180 tablet 3  . rosuvastatin (CRESTOR) 10 MG tablet TAKE 1 TABLET BY MOUTH  DAILY 90 tablet 1   No current facility-administered medications for this visit.    Vytorin [ezetimibe-simvastatin] Family History  Problem Relation Age of Onset  . Heart disease Mother        a fib  . Kidney disease Mother   . Heart disease Father        MI  . Hyperlipidemia Father   . Hypertension Father   . Kidney disease Father   . Kidney disease Brother   . Diabetes Maternal Grandmother   . Stroke Maternal Grandmother    Social History:   reports that she has quit smoking. she has never used smokeless tobacco. She reports that she does not drink alcohol or use drugs.   REVIEW OF SYSTEMS : Negative except for see problem list  Physical Exam:   There were no vitals taken for this visit. There is no height or weight on file to calculate BMI.  BMI 45  Gen:  WDWN WF NAD  Neurological: Alert and oriented to person, place, and  time. Motor and sensory function is grossly intact  Head: Normocephalic and atraumatic.  Eyes: Conjunctivae are normal. Pupils are equal, round, and reactive to light. No scleral icterus.  Neck: Normal range of motion. Neck supple. No tracheal deviation or thyromegaly present.  Cardiovascular:  SR without murmurs or gallops.  No carotid bruits Breast:  Not examined Respiratory: Effort normal.  No respiratory distress. No chest wall tenderness. Breath sounds normal.  No wheezes, rales or rhonchi.  Abdomen:  nontender GU:  Not examined Musculoskeletal: Normal range of motion. Extremities are nontender. No cyanosis, edema or clubbing noted Lymphadenopathy: No cervical, preauricular, postauricular or axillary adenopathy is  present Skin: Skin is warm and dry. No rash noted. No diaphoresis. No erythema. No pallor. Pscyh: Normal mood and affect. Behavior is normal. Judgment and thought content normal.   LABORATORY RESULTS: No results found for this or any previous visit (from the past 48 hour(s)).   RADIOLOGY RESULTS: No results found.  Problem List: Patient Active Problem List   Diagnosis Date Noted  . Need for vaccination with 13-polyvalent pneumococcal conjugate vaccine 01/31/2017  . Diabetes mellitus type 2 in obese ([place]) 10/10/2016  . Rectal carcinoid tumor 08/19/2015  . Internal hemorrhoids 08/19/2015  . Diverticular disease of colon 08/19/2015  . IDA (iron deficiency anemia) 08/14/2015  . Essential hypertension 02/22/2014  . Metabolic syndrome X 92/92/4462  . GAD (generalized anxiety disorder) 10/30/2011  . Hyperlipidemia 04/29/2006  . MORBID OBESITY 04/29/2006  . GERD 04/29/2006  . IBS 04/29/2006  . CALCULUS, KIDNEY 04/29/2006    Assessment & Plan: DM and hypertension and morbid obesity with BMI>45;  Plan sleeve gastrectomy.      Matt B. Hassell Done, MD, [facility] [phone] beeper [phone]  04/22/2017 4:22 PM

## 2017-05-01 ENCOUNTER — Telehealth: Payer: Self-pay | Admitting: Family Medicine

## 2017-05-01 NOTE — Progress Notes (Signed)
ekg 09-23-16  Epic  cxr 01-02-17 epic

## 2017-05-01 NOTE — Patient Instructions (Signed)
Caitlin Sampson  05/01/2017   Your procedure is scheduled on: 05-05-17  Report to [facility] Main  Entrance Take [place]  elevators to 3rd floor to  [place] at     1000 AM.    Call this number if you have problems the morning of surgery [phone]    Remember: ONLY 1 PERSON MAY GO WITH YOU TO SHORT STAY TO GET  READY MORNING OF YOUR SURGERY.  Do not eat food or drink liquids :After Midnight.     Take these medicines the morning of surgery with A SIP OF WATER: none DO NOT TAKE ANY DIABETIC MEDICATIONS DAY OF YOUR SURGERY                               You may not have any metal on your body including hair pins and              piercings  Do not wear jewelry, make-up, lotions, powders or perfumes, deodorant         Do not bring valuables to the hospital. [place].  Contacts, dentures or bridgework may not be worn into surgery.  Leave suitcase in the car. After surgery it may be brought to your room.                 Please read over the following fact sheets you were given: _____________________________________________________________________          [facility] - Preparing for Surgery Before surgery, you can play an important role.  Because skin is not sterile, your skin needs to be as free of germs as possible.  You can reduce the number of germs on your skin by washing with CHG (chlorahexidine gluconate) soap before surgery.  CHG is an antiseptic cleaner which kills germs and bonds with the skin to continue killing germs even after washing. Please DO NOT use if you have an allergy to CHG or antibacterial soaps.  If your skin becomes reddened/irritated stop using the CHG and inform your nurse when you arrive at Short Stay. Do not shave (including legs and underarms) for at least 48 hours prior to the first CHG shower.  You may shave your face/neck. Please follow these instructions carefully:  1.   Shower with CHG Soap the night before surgery and the  morning of Surgery.  2.  If you choose to wash your hair, wash your hair first as usual with your  normal  shampoo.  3.  After you shampoo, rinse your hair and body thoroughly to remove the  shampoo.                           4.  Use CHG as you would any other liquid soap.  You can apply chg directly  to the skin and wash                       Gently with a scrungie or clean washcloth.  5.  Apply the CHG Soap to your body ONLY FROM THE NECK DOWN.   Do not use on face/ open  Wound or open sores. Avoid contact with eyes, ears mouth and genitals (private parts).                       Wash face,  Genitals (private parts) with your normal soap.             6.  Wash thoroughly, paying special attention to the area where your surgery  will be performed.  7.  Thoroughly rinse your body with warm water from the neck down.  8.  DO NOT shower/wash with your normal soap after using and rinsing off  the CHG Soap.                9.  Pat yourself dry with a clean towel.            10.  Wear clean pajamas.            11.  Place clean sheets on your bed the night of your first shower and do not  sleep with pets. Day of Surgery : Do not apply any lotions/deodorants the morning of surgery.  Please wear clean clothes to the hospital/surgery center.  FAILURE TO FOLLOW THESE INSTRUCTIONS MAY RESULT IN THE CANCELLATION OF YOUR SURGERY PATIENT SIGNATURE_________________________________  NURSE SIGNATURE__________________________________  ________________________________________________________________________  WHAT IS A BLOOD TRANSFUSION? Blood Transfusion Information  A transfusion is the replacement of blood or some of its parts. Blood is made up of multiple cells which provide different functions.  Red blood cells carry oxygen and are used for blood loss replacement.  White blood cells fight against infection.  Platelets control  bleeding.  Plasma helps clot blood.  Other blood products are available for specialized needs, such as hemophilia or other clotting disorders. BEFORE THE TRANSFUSION  Who gives blood for transfusions?   Healthy volunteers who are fully evaluated to make sure their blood is safe. This is blood bank blood. Transfusion therapy is the safest it has ever been in the practice of medicine. Before blood is taken from a donor, a complete history is taken to make sure that person has no history of diseases nor engages in risky social behavior (examples are intravenous drug use or sexual activity with multiple partners). The donor's travel history is screened to minimize risk of transmitting infections, such as malaria. The donated blood is tested for signs of infectious diseases, such as HIV and hepatitis. The blood is then tested to be sure it is compatible with you in order to minimize the chance of a transfusion reaction. If you or a relative donates blood, this is often done in anticipation of surgery and is not appropriate for emergency situations. It takes many days to process the donated blood. RISKS AND COMPLICATIONS Although transfusion therapy is very safe and saves many lives, the main dangers of transfusion include:   Getting an infectious disease.  Developing a transfusion reaction. This is an allergic reaction to something in the blood you were given. Every precaution is taken to prevent this. The decision to have a blood transfusion has been considered carefully by your caregiver before blood is given. Blood is not given unless the benefits outweigh the risks. AFTER THE TRANSFUSION  Right after receiving a blood transfusion, you will usually feel much better and more energetic. This is especially true if your red blood cells have gotten low (anemic). The transfusion raises the level of the red blood cells which carry oxygen, and this usually causes an energy increase.  The  nurse  administering the transfusion will monitor you carefully for complications. HOME CARE INSTRUCTIONS  No special instructions are needed after a transfusion. You may find your energy is better. Speak with your caregiver about any limitations on activity for underlying diseases you may have. SEEK MEDICAL CARE IF:   Your condition is not improving after your transfusion.  You develop redness or irritation at the intravenous (IV) site. SEEK IMMEDIATE MEDICAL CARE IF:  Any of the following symptoms occur over the next 12 hours:  Shaking chills.  You have a temperature by mouth above 102 F (38.9 C), not controlled by medicine.  Chest, back, or muscle pain.  People around you feel you are not acting correctly or are confused.  Shortness of breath or difficulty breathing.  Dizziness and fainting.  You get a rash or develop hives.  You have a decrease in urine output.  Your urine turns a dark color or changes to pink, red, or brown. Any of the following symptoms occur over the next 10 days:  You have a temperature by mouth above 102 F (38.9 C), not controlled by medicine.  Shortness of breath.  Weakness after normal activity.  The white part of the eye turns yellow (jaundice).  You have a decrease in the amount of urine or are urinating less often.  Your urine turns a dark color or changes to pink, red, or brown. Document Released: 05/02/2000 Document Revised: 07/28/2011 Document Reviewed: 12/20/2007 ExitCare Patient Information 2014 [place].  _______________________________________________________________________  Incentive Spirometer  An incentive spirometer is a tool that can help keep your lungs clear and active. This tool measures how well you are filling your lungs with each breath. Taking long deep breaths may help reverse or decrease the chance of developing breathing (pulmonary) problems (especially infection) following:  A long period of time when you are  unable to move or be active. BEFORE THE PROCEDURE   If the spirometer includes an indicator to show your best effort, your nurse or respiratory therapist will set it to a desired goal.  If possible, sit up straight or lean slightly forward. Try not to slouch.  Hold the incentive spirometer in an upright position. INSTRUCTIONS FOR USE  1. Sit on the edge of your bed if possible, or sit up as far as you can in bed or on a chair. 2. Hold the incentive spirometer in an upright position. 3. Breathe out normally. 4. Place the mouthpiece in your mouth and seal your lips tightly around it. 5. Breathe in slowly and as deeply as possible, raising the piston or the ball toward the top of the column. 6. Hold your breath for 3-5 seconds or for as long as possible. Allow the piston or ball to fall to the bottom of the column. 7. Remove the mouthpiece from your mouth and breathe out normally. 8. Rest for a few seconds and repeat Steps 1 through 7 at least 10 times every 1-2 hours when you are awake. Take your time and take a few normal breaths between deep breaths. 9. The spirometer may include an indicator to show your best effort. Use the indicator as a goal to work toward during each repetition. 10. After each set of 10 deep breaths, practice coughing to be sure your lungs are clear. If you have an incision (the cut made at the time of surgery), support your incision when coughing by placing a pillow or rolled up towels firmly against it. Once you are able to get  out of bed, walk around indoors and cough well. You may stop using the incentive spirometer when instructed by your caregiver.  RISKS AND COMPLICATIONS  Take your time so you do not get dizzy or light-headed.  If you are in pain, you may need to take or ask for pain medication before doing incentive spirometry. It is harder to take a deep breath if you are having pain. AFTER USE  Rest and breathe slowly and easily.  It can be helpful to  keep track of a log of your progress. Your caregiver can provide you with a simple table to help with this. If you are using the spirometer at home, follow these instructions: [place] IF:   You are having difficultly using the spirometer.  You have trouble using the spirometer as often as instructed.  Your pain medication is not giving enough relief while using the spirometer.  You develop fever of 100.5 F (38.1 C) or higher. SEEK IMMEDIATE MEDICAL CARE IF:   You cough up bloody sputum that had not been present before.  You develop fever of 102 F (38.9 C) or greater.  You develop worsening pain at or near the incision site. MAKE SURE YOU:   Understand these instructions.  Will watch your condition.  Will get help right away if you are not doing well or get worse. Document Released: 09/15/2006 Document Revised: 07/28/2011 Document Reviewed: 11/16/2006 [facility] Patient Information 2014 [place], [place].   ________________________________________________________________________

## 2017-05-01 NOTE — Telephone Encounter (Signed)
Pt called in and is having surgery on Tuesday at [facility] , the surgeon advised that she needs medicine that is smaller than a dime. All the medicine is fine except for the [place] Potassium plus hydro. Can she cout this medicine in half or can you call her in something else for 1 month..[phone]

## 2017-05-04 ENCOUNTER — Encounter (HOSPITAL_COMMUNITY): Payer: Self-pay

## 2017-05-04 ENCOUNTER — Encounter (HOSPITAL_COMMUNITY)
Admission: RE | Admit: 2017-05-04 | Discharge: 2017-05-04 | Disposition: A | Discharge status: Home / Self Care | Payer: 59 | Source: Ambulatory Visit | Attending: Surgery | Admitting: Surgery

## 2017-05-04 ENCOUNTER — Other Ambulatory Visit: Payer: Self-pay

## 2017-05-04 HISTORY — DX: Personal history of other diseases of the digestive system: Z87.19

## 2017-05-04 HISTORY — DX: Adverse effect of unspecified anesthetic, initial encounter: T41.45XA

## 2017-05-04 HISTORY — DX: Personal history of urinary calculi: Z87.442

## 2017-05-04 HISTORY — DX: Other complications of anesthesia, initial encounter: T88.59XA

## 2017-05-04 LAB — COMPREHENSIVE METABOLIC PANEL
ALK PHOS: 79 U/L (ref 38–126)
ALT: 30 U/L (ref 14–54)
AST: 29 U/L (ref 15–41)
Albumin: 4.3 g/dL (ref 3.5–5.0)
Anion gap: 7 (ref 5–15)
BUN: 17 mg/dL (ref 6–20)
CALCIUM: 10.5 mg/dL — AB (ref 8.9–10.3)
CHLORIDE: 103 mmol/L (ref 101–111)
CO2: 28 mmol/L (ref 22–32)
CREATININE: 0.59 mg/dL (ref 0.44–1.00)
GFR calc non Af Amer: >60 mL/min (ref >60)
GLUCOSE: 112 mg/dL — AB (ref 65–99)
Potassium: 3.4 mmol/L — ABNORMAL LOW (ref 3.5–5.1)
SODIUM: 138 mmol/L (ref 135–145)
Total Bilirubin: 0.8 mg/dL (ref 0.3–1.2)
Total Protein: 7.2 g/dL (ref 6.5–8.1)

## 2017-05-04 LAB — CBC WITH DIFFERENTIAL/PLATELET
BASOS ABS: 0 10*3/uL (ref 0.0–0.1)
Basophils Relative: 0 %
EOS ABS: 0.1 10*3/uL (ref 0.0–0.7)
EOS PCT: 1 %
HCT: 38.6 % (ref 36.0–46.0)
HEMOGLOBIN: 12.7 g/dL (ref 12.0–15.0)
LYMPHS ABS: 2.4 10*3/uL (ref 0.7–4.0)
LYMPHS PCT: 34 %
MCH: 28.7 pg (ref 26.0–34.0)
MCHC: 32.9 g/dL (ref 30.0–36.0)
MCV: 87.1 fL (ref 78.0–100.0)
Monocytes Absolute: 0.8 10*3/uL (ref 0.1–1.0)
Monocytes Relative: 10 %
NEUTROS PCT: 55 %
Neutro Abs: 3.9 10*3/uL (ref 1.7–7.7)
PLATELETS: 364 10*3/uL (ref 150–400)
RBC: 4.43 MIL/uL (ref 3.87–5.11)
RDW: 15 % (ref 11.5–15.5)
WBC: 7.2 10*3/uL (ref 4.0–10.5)

## 2017-05-04 LAB — ABO/RH: ABO/RH(D): O POS

## 2017-05-04 LAB — GLUCOSE, CAPILLARY: GLUCOSE-CAPILLARY: 114 mg/dL — AB (ref 65–99)

## 2017-05-04 NOTE — Telephone Encounter (Signed)
I spoke directly with the pt after verifying that the hyzaar can be cut in half, and advised her to cut the medication in half and take the two halves daily for blood pressure control, also wished her all the best with her surgery in the morning

## 2017-05-04 NOTE — Progress Notes (Signed)
cmp done 05-04-17 routed to Dr. Hassell Done via epic

## 2017-05-05 ENCOUNTER — Inpatient Hospital Stay (HOSPITAL_COMMUNITY): Payer: 59 | Admitting: Certified Registered Nurse Anesthetist

## 2017-05-05 ENCOUNTER — Other Ambulatory Visit: Payer: Self-pay

## 2017-05-05 ENCOUNTER — Encounter (HOSPITAL_COMMUNITY): Payer: Self-pay | Admitting: Emergency Medicine

## 2017-05-05 ENCOUNTER — Inpatient Hospital Stay (HOSPITAL_COMMUNITY)
Admission: RE | Admit: 2017-05-05 | Discharge: 2017-05-06 | DRG: 621 | Disposition: A | Discharge status: Home / Self Care | Payer: 59 | Source: Ambulatory Visit | Attending: Surgery | Admitting: Surgery

## 2017-05-05 ENCOUNTER — Encounter (HOSPITAL_COMMUNITY)
Admission: RE | Disposition: A | Discharge status: Home / Self Care | Payer: Self-pay | Source: Ambulatory Visit | Attending: Surgery

## 2017-05-05 DIAGNOSIS — Z6841 Body Mass Index (BMI) 40.0 and over, adult: Secondary | ICD-10-CM

## 2017-05-05 DIAGNOSIS — D509 Iron deficiency anemia, unspecified: Secondary | ICD-10-CM | POA: Diagnosis present

## 2017-05-05 DIAGNOSIS — Z79899 Other long term (current) drug therapy: Secondary | ICD-10-CM

## 2017-05-05 DIAGNOSIS — Z9884 Bariatric surgery status: Secondary | ICD-10-CM

## 2017-05-05 DIAGNOSIS — Z833 Family history of diabetes mellitus: Secondary | ICD-10-CM | POA: Diagnosis not present

## 2017-05-05 DIAGNOSIS — Z90710 Acquired absence of both cervix and uterus: Secondary | ICD-10-CM | POA: Diagnosis not present

## 2017-05-05 DIAGNOSIS — Z823 Family history of stroke: Secondary | ICD-10-CM | POA: Diagnosis not present

## 2017-05-05 DIAGNOSIS — Z91041 Radiographic dye allergy status: Secondary | ICD-10-CM | POA: Diagnosis not present

## 2017-05-05 DIAGNOSIS — E119 Type 2 diabetes mellitus without complications: Secondary | ICD-10-CM | POA: Diagnosis present

## 2017-05-05 DIAGNOSIS — Z87891 Personal history of nicotine dependence: Secondary | ICD-10-CM

## 2017-05-05 DIAGNOSIS — Z8349 Family history of other endocrine, nutritional and metabolic diseases: Secondary | ICD-10-CM

## 2017-05-05 DIAGNOSIS — Z87442 Personal history of urinary calculi: Secondary | ICD-10-CM | POA: Diagnosis not present

## 2017-05-05 DIAGNOSIS — K589 Irritable bowel syndrome without diarrhea: Secondary | ICD-10-CM | POA: Diagnosis present

## 2017-05-05 DIAGNOSIS — K219 Gastro-esophageal reflux disease without esophagitis: Secondary | ICD-10-CM | POA: Diagnosis present

## 2017-05-05 DIAGNOSIS — I10 Essential (primary) hypertension: Secondary | ICD-10-CM | POA: Diagnosis present

## 2017-05-05 DIAGNOSIS — Z7982 Long term (current) use of aspirin: Secondary | ICD-10-CM | POA: Diagnosis not present

## 2017-05-05 DIAGNOSIS — E785 Hyperlipidemia, unspecified: Secondary | ICD-10-CM | POA: Diagnosis present

## 2017-05-05 DIAGNOSIS — Z888 Allergy status to other drugs, medicaments and biological substances status: Secondary | ICD-10-CM

## 2017-05-05 DIAGNOSIS — Z9049 Acquired absence of other specified parts of digestive tract: Secondary | ICD-10-CM | POA: Diagnosis not present

## 2017-05-05 DIAGNOSIS — Z8249 Family history of ischemic heart disease and other diseases of the circulatory system: Secondary | ICD-10-CM | POA: Diagnosis not present

## 2017-05-05 DIAGNOSIS — F411 Generalized anxiety disorder: Secondary | ICD-10-CM | POA: Diagnosis present

## 2017-05-05 DIAGNOSIS — Z7984 Long term (current) use of oral hypoglycemic drugs: Secondary | ICD-10-CM

## 2017-05-05 DIAGNOSIS — Z841 Family history of disorders of kidney and ureter: Secondary | ICD-10-CM

## 2017-05-05 HISTORY — PX: LAPAROSCOPIC GASTRIC SLEEVE RESECTION: SHX5895

## 2017-05-05 LAB — CREATININE, SERUM
Creatinine, Ser: 0.64 mg/dL (ref 0.44–1.00)
GFR calc Af Amer: >60 mL/min (ref >60)
GFR calc non Af Amer: >60 mL/min (ref >60)

## 2017-05-05 LAB — GLUCOSE, CAPILLARY
GLUCOSE-CAPILLARY: 107 mg/dL — AB (ref 65–99)
GLUCOSE-CAPILLARY: 157 mg/dL — AB (ref 65–99)
GLUCOSE-CAPILLARY: 193 mg/dL — AB (ref 65–99)
Glucose-Capillary: 175 mg/dL — ABNORMAL HIGH (ref 65–99)
Glucose-Capillary: 170 mg/dL — ABNORMAL HIGH (ref 65–99)

## 2017-05-05 LAB — CBC
HEMATOCRIT: 36.6 % (ref 36.0–46.0)
Hemoglobin: 12 g/dL (ref 12.0–15.0)
MCH: 28.3 pg (ref 26.0–34.0)
MCHC: 32.8 g/dL (ref 30.0–36.0)
MCV: 86.3 fL (ref 78.0–100.0)
PLATELETS: 321 10*3/uL (ref 150–400)
RBC: 4.24 MIL/uL (ref 3.87–5.11)
RDW: 14.8 % (ref 11.5–15.5)
WBC: 16.4 10*3/uL — AB (ref 4.0–10.5)

## 2017-05-05 LAB — HEMOGLOBIN AND HEMATOCRIT, BLOOD
HEMATOCRIT: 36.2 % (ref 36.0–46.0)
Hemoglobin: 11.9 g/dL — ABNORMAL LOW (ref 12.0–15.0)

## 2017-05-05 LAB — TYPE AND SCREEN
ABO/RH(D): O POS
Antibody Screen: NEGATIVE

## 2017-05-05 LAB — HEMOGLOBIN A1C
Hgb A1c MFr Bld: 6.2 % — ABNORMAL HIGH (ref 4.8–5.6)
Mean Plasma Glucose: 131 mg/dL

## 2017-05-05 SURGERY — GASTRECTOMY, SLEEVE, LAPAROSCOPIC
Anesthesia: General | Site: Abdomen

## 2017-05-05 MED ORDER — ROCURONIUM BROMIDE 50 MG/5ML IV SOSY
PREFILLED_SYRINGE | INTRAVENOUS | Status: AC
  Filled 2017-05-05: qty 5

## 2017-05-05 MED ORDER — MIDAZOLAM HCL 2 MG/2ML IJ SOLN
INTRAMUSCULAR | Status: AC
  Filled 2017-05-05: qty 2

## 2017-05-05 MED ORDER — ONDANSETRON HCL 4 MG/2ML IJ SOLN
4.0000 mg | Freq: Once | INTRAMUSCULAR | Status: DC | PRN
Start: 2017-05-05 — End: 2017-05-05

## 2017-05-05 MED ORDER — ONDANSETRON HCL 4 MG/2ML IJ SOLN: 4.0000 mg | INTRAMUSCULAR | Status: DC | PRN

## 2017-05-05 MED ORDER — LACTATED RINGERS IV SOLN
INTRAVENOUS | Status: DC
  Administered 2017-05-05: 11:00:00 via INTRAVENOUS

## 2017-05-05 MED ORDER — CHLORHEXIDINE GLUCONATE CLOTH 2 % EX PADS: 6.0000 | MEDICATED_PAD | Freq: Once | CUTANEOUS | Status: DC

## 2017-05-05 MED ORDER — HEPARIN SODIUM (PORCINE) 5000 UNIT/ML IJ SOLN
5000.0000 [IU] | INTRAMUSCULAR | Status: AC
  Administered 2017-05-05: 5000 [IU] via SUBCUTANEOUS
  Filled 2017-05-05: qty 1

## 2017-05-05 MED ORDER — KCL IN DEXTROSE-NACL 20-5-0.45 MEQ/L-%-% IV SOLN
INTRAVENOUS | Status: DC
  Administered 2017-05-05: via INTRAVENOUS
  Administered 2017-05-05: 1000 mL via INTRAVENOUS
  Administered 2017-05-06: 11:00:00 via INTRAVENOUS
  Filled 2017-05-05 (×3): qty 1000

## 2017-05-05 MED ORDER — ROCURONIUM BROMIDE 50 MG/5ML IV SOSY
PREFILLED_SYRINGE | INTRAVENOUS | Status: AC
  Filled 2017-05-05: qty 10

## 2017-05-05 MED ORDER — SUGAMMADEX SODIUM 500 MG/5ML IV SOLN
INTRAVENOUS | Status: AC
  Filled 2017-05-05: qty 5

## 2017-05-05 MED ORDER — SODIUM CHLORIDE 0.9 % IJ SOLN
INTRAMUSCULAR | Status: AC
  Filled 2017-05-05: qty 20

## 2017-05-05 MED ORDER — PANTOPRAZOLE SODIUM 40 MG IV SOLR
40.0000 mg | Freq: Every day | INTRAVENOUS | Status: DC
  Administered 2017-05-05: 40 mg via INTRAVENOUS
  Filled 2017-05-05: qty 40

## 2017-05-05 MED ORDER — ONDANSETRON HCL 4 MG/2ML IJ SOLN
INTRAMUSCULAR | Status: DC | PRN
  Administered 2017-05-05: 4 mg via INTRAVENOUS

## 2017-05-05 MED ORDER — GABAPENTIN 300 MG PO CAPS
300.0000 mg | ORAL_CAPSULE | ORAL | Status: AC
  Administered 2017-05-05: 300 mg via ORAL
  Filled 2017-05-05: qty 1

## 2017-05-05 MED ORDER — LIDOCAINE 2% (20 MG/ML) 5 ML SYRINGE
INTRAMUSCULAR | Status: DC | PRN
  Administered 2017-05-05: 100 mg via INTRAVENOUS

## 2017-05-05 MED ORDER — PROPOFOL 10 MG/ML IV BOLUS
INTRAVENOUS | Status: DC | PRN
  Administered 2017-05-05: 20 mg via INTRAVENOUS
  Administered 2017-05-05: 120 mg via INTRAVENOUS

## 2017-05-05 MED ORDER — FENTANYL CITRATE (PF) 250 MCG/5ML IJ SOLN
INTRAMUSCULAR | Status: AC
Start: 2017-05-05 — End: ?
  Filled 2017-05-05: qty 5

## 2017-05-05 MED ORDER — SUGAMMADEX SODIUM 200 MG/2ML IV SOLN
INTRAVENOUS | Status: DC | PRN
  Administered 2017-05-05: 300 mg via INTRAVENOUS

## 2017-05-05 MED ORDER — 0.9 % SODIUM CHLORIDE (POUR BTL) OPTIME
TOPICAL | Status: DC | PRN
  Administered 2017-05-05: 1000 mL

## 2017-05-05 MED ORDER — KCL IN DEXTROSE-NACL 20-5-0.45 MEQ/L-%-% IV SOLN
INTRAVENOUS | Status: AC
  Administered 2017-05-05: 1000 mL via INTRAVENOUS
  Filled 2017-05-05: qty 1000

## 2017-05-05 MED ORDER — MEPERIDINE HCL 50 MG/ML IJ SOLN: 6.2500 mg | INTRAMUSCULAR | Status: DC | PRN

## 2017-05-05 MED ORDER — HYDROMORPHONE HCL 1 MG/ML IJ SOLN
0.2500 mg | INTRAMUSCULAR | Status: DC | PRN
  Administered 2017-05-05: 0.25 mg via INTRAVENOUS
  Administered 2017-05-05: 0.5 mg via INTRAVENOUS
  Administered 2017-05-05: 0.25 mg via INTRAVENOUS

## 2017-05-05 MED ORDER — OXYCODONE HCL 5 MG/5ML PO SOLN
5.0000 mg | ORAL | Status: DC | PRN
  Administered 2017-05-06: 5 mg via ORAL
  Filled 2017-05-05: qty 5

## 2017-05-05 MED ORDER — CEFOTETAN DISODIUM-DEXTROSE 2-2.08 GM-%(50ML) IV SOLR
2.0000 g | INTRAVENOUS | Status: AC
  Administered 2017-05-05: 2 g via INTRAVENOUS
  Filled 2017-05-05: qty 50

## 2017-05-05 MED ORDER — APREPITANT 40 MG PO CAPS
40.0000 mg | ORAL_CAPSULE | ORAL | Status: AC
  Administered 2017-05-05: 40 mg via ORAL
  Filled 2017-05-05: qty 1

## 2017-05-05 MED ORDER — ACETAMINOPHEN 500 MG PO TABS
1000.0000 mg | ORAL_TABLET | ORAL | Status: AC
  Administered 2017-05-05: 1000 mg via ORAL
  Filled 2017-05-05: qty 2

## 2017-05-05 MED ORDER — CELECOXIB 200 MG PO CAPS
400.0000 mg | ORAL_CAPSULE | ORAL | Status: AC
  Administered 2017-05-05: 400 mg via ORAL
  Filled 2017-05-05: qty 2

## 2017-05-05 MED ORDER — BUPIVACAINE LIPOSOME 1.3 % IJ SUSP
INTRAMUSCULAR | Status: DC | PRN
  Administered 2017-05-05: 20 mL

## 2017-05-05 MED ORDER — HYDROMORPHONE HCL 1 MG/ML IJ SOLN
INTRAMUSCULAR | Status: AC
  Administered 2017-05-05: 0.25 mg via INTRAVENOUS
  Filled 2017-05-05: qty 1

## 2017-05-05 MED ORDER — LIDOCAINE 2% (20 MG/ML) 5 ML SYRINGE
INTRAMUSCULAR | Status: DC | PRN
  Administered 2017-05-05: 1.5 mg/kg/h via INTRAVENOUS

## 2017-05-05 MED ORDER — GABAPENTIN 250 MG/5ML PO SOLN
200.0000 mg | Freq: Two times a day (BID) | ORAL | Status: DC
  Administered 2017-05-05 – 2017-05-06 (×2): 200 mg via ORAL
  Filled 2017-05-05 (×2): qty 4

## 2017-05-05 MED ORDER — INSULIN ASPART 100 UNIT/ML ~~LOC~~ SOLN
0.0000 [IU] | SUBCUTANEOUS | Status: DC
  Administered 2017-05-05 (×3): 4 [IU] via SUBCUTANEOUS
  Administered 2017-05-06: 3 [IU] via SUBCUTANEOUS

## 2017-05-05 MED ORDER — LACTATED RINGERS IR SOLN
Status: DC | PRN
  Administered 2017-05-05: 1000 mL

## 2017-05-05 MED ORDER — EPHEDRINE SULFATE-NACL 50-0.9 MG/10ML-% IV SOSY
PREFILLED_SYRINGE | INTRAVENOUS | Status: DC | PRN
  Administered 2017-05-05: 10 mg via INTRAVENOUS
  Administered 2017-05-05: 2.5 mg via INTRAVENOUS
  Administered 2017-05-05 (×3): 10 mg via INTRAVENOUS
  Administered 2017-05-05: 5 mg via INTRAVENOUS

## 2017-05-05 MED ORDER — BUPIVACAINE LIPOSOME 1.3 % IJ SUSP
20.0000 mL | Freq: Once | INTRAMUSCULAR | Status: DC
  Filled 2017-05-05: qty 20

## 2017-05-05 MED ORDER — ONDANSETRON HCL 4 MG/2ML IJ SOLN
INTRAMUSCULAR | Status: AC
Start: 2017-05-05 — End: ?
  Filled 2017-05-05: qty 2

## 2017-05-05 MED ORDER — DEXAMETHASONE SODIUM PHOSPHATE 4 MG/ML IJ SOLN: 4.0000 mg | INTRAMUSCULAR | Status: DC

## 2017-05-05 MED ORDER — FENTANYL CITRATE (PF) 100 MCG/2ML IJ SOLN
INTRAMUSCULAR | Status: DC | PRN
  Administered 2017-05-05: 50 ug via INTRAVENOUS
  Administered 2017-05-05: 100 ug via INTRAVENOUS

## 2017-05-05 MED ORDER — PREMIER PROTEIN SHAKE
2.0000 [oz_av] | ORAL | Status: DC
  Administered 2017-05-06 (×3): 2 [oz_av] via ORAL

## 2017-05-05 MED ORDER — ACETAMINOPHEN 160 MG/5ML PO SOLN: 650.0000 mg | ORAL | Status: DC | PRN

## 2017-05-05 MED ORDER — DEXAMETHASONE SODIUM PHOSPHATE 10 MG/ML IJ SOLN
INTRAMUSCULAR | Status: AC
Start: 2017-05-05 — End: ?
  Filled 2017-05-05: qty 1

## 2017-05-05 MED ORDER — MIDAZOLAM HCL 5 MG/5ML IJ SOLN
INTRAMUSCULAR | Status: DC | PRN
  Administered 2017-05-05: 2 mg via INTRAVENOUS

## 2017-05-05 MED ORDER — HEPARIN SODIUM (PORCINE) 5000 UNIT/ML IJ SOLN
5000.0000 [IU] | Freq: Three times a day (TID) | INTRAMUSCULAR | Status: DC
  Administered 2017-05-05 – 2017-05-06 (×2): 5000 [IU] via SUBCUTANEOUS
  Filled 2017-05-05 (×2): qty 1

## 2017-05-05 MED ORDER — SCOPOLAMINE 1 MG/3DAYS TD PT72
1.0000 | MEDICATED_PATCH | TRANSDERMAL | Status: DC
  Administered 2017-05-05: 1.5 mg via TRANSDERMAL
  Filled 2017-05-05: qty 1

## 2017-05-05 MED ORDER — HYDRALAZINE HCL 20 MG/ML IJ SOLN: 10.0000 mg | INTRAMUSCULAR | Status: DC | PRN

## 2017-05-05 MED ORDER — DEXAMETHASONE SODIUM PHOSPHATE 10 MG/ML IJ SOLN
INTRAMUSCULAR | Status: DC | PRN
  Administered 2017-05-05: 10 mg via INTRAVENOUS

## 2017-05-05 MED ORDER — MORPHINE SULFATE (PF) 2 MG/ML IV SOLN
1.0000 mg | INTRAVENOUS | Status: DC | PRN
  Administered 2017-05-05 (×2): 2 mg via INTRAVENOUS
  Filled 2017-05-05 (×2): qty 1

## 2017-05-05 MED ORDER — ROCURONIUM BROMIDE 10 MG/ML (PF) SYRINGE
PREFILLED_SYRINGE | INTRAVENOUS | Status: DC | PRN
  Administered 2017-05-05: 50 mg via INTRAVENOUS
  Administered 2017-05-05: 20 mg via INTRAVENOUS
  Administered 2017-05-05: 10 mg via INTRAVENOUS

## 2017-05-05 SURGICAL SUPPLY — 65 items
ADH SKN CLS APL DERMABOND .7 (GAUZE/BANDAGES/DRESSINGS) ×1
APPLICATOR COTTON TIP 6IN STRL (MISCELLANEOUS) IMPLANT
APPLIER CLIP 5 13 M/L LIGAMAX5 (MISCELLANEOUS)
APPLIER CLIP ROT 10 11.4 M/L (STAPLE)
APPLIER CLIP ROT 13.4 12 LRG (CLIP)
APR CLP LRG 13.4X12 ROT 20 MLT (CLIP)
APR CLP MED LRG 11.4X10 (STAPLE)
APR CLP MED LRG 5 ANG JAW (MISCELLANEOUS)
BLADE SURG 15 STRL LF DISP TIS (BLADE) ×1 IMPLANT
BLADE SURG 15 STRL SS (BLADE) ×3
CABLE HIGH FREQUENCY MONO STRZ (ELECTRODE) ×3 IMPLANT
CLIP APPLIE 5 13 M/L LIGAMAX5 (MISCELLANEOUS) IMPLANT
CLIP APPLIE ROT 10 11.4 M/L (STAPLE) IMPLANT
CLIP APPLIE ROT 13.4 12 LRG (CLIP) IMPLANT
DERMABOND ADVANCED (GAUZE/BANDAGES/DRESSINGS) ×2
DERMABOND ADVANCED .7 DNX12 (GAUZE/BANDAGES/DRESSINGS) IMPLANT
DEVICE PMI PUNCTURE CLOSURE (MISCELLANEOUS) ×3 IMPLANT
DEVICE SUT QUICK LOAD TK 5 (STAPLE) IMPLANT
DEVICE SUT TI-KNOT TK 5X26 (MISCELLANEOUS) IMPLANT
DEVICE SUTURE ENDOST 10MM (ENDOMECHANICALS) IMPLANT
DEVICE TI KNOT TK5 (MISCELLANEOUS)
DISSECTOR BLUNT TIP ENDO 5MM (MISCELLANEOUS) IMPLANT
ELECT REM PT RETURN 15FT ADLT (MISCELLANEOUS) ×3 IMPLANT
GAUZE SPONGE 4X4 12PLY STRL (GAUZE/BANDAGES/DRESSINGS) IMPLANT
GLOVE BIOGEL M 8.0 STRL (GLOVE) ×3 IMPLANT
GOWN STRL REUS W/TWL XL LVL3 (GOWN DISPOSABLE) ×14 IMPLANT
HANDLE STAPLE EGIA 4 XL (STAPLE) ×3 IMPLANT
HOVERMATT SINGLE USE (MISCELLANEOUS) ×3 IMPLANT
KIT BASIN OR (CUSTOM PROCEDURE TRAY) ×3 IMPLANT
MARKER SKIN DUAL TIP RULER LAB (MISCELLANEOUS) ×3 IMPLANT
NDL SPNL 22GX3.5 QUINCKE BK (NEEDLE) ×1 IMPLANT
NEEDLE SPNL 22GX3.5 QUINCKE BK (NEEDLE) ×3 IMPLANT
PACK UNIVERSAL I (CUSTOM PROCEDURE TRAY) ×3 IMPLANT
QUICK LOAD TK 5 (STAPLE)
RELOAD STAPLE 45 PURP MED/THCK (STAPLE) IMPLANT
RELOAD TRI 45 ART MED THCK BLK (STAPLE) ×3 IMPLANT
RELOAD TRI 45 ART MED THCK PUR (STAPLE) IMPLANT
RELOAD TRI 60 ART MED THCK BLK (STAPLE) ×3 IMPLANT
RELOAD TRI 60 ART MED THCK PUR (STAPLE) ×6 IMPLANT
SCISSORS LAP 5X45 EPIX DISP (ENDOMECHANICALS) ×2 IMPLANT
SET IRRIG TUBING LAPAROSCOPIC (IRRIGATION / IRRIGATOR) ×3 IMPLANT
SHEARS HARMONIC ACE PLUS 45CM (MISCELLANEOUS) ×3 IMPLANT
SLEEVE ADV FIXATION 5X100MM (TROCAR) ×6 IMPLANT
SLEEVE GASTRECTOMY 36FR VISIGI (MISCELLANEOUS) ×3 IMPLANT
SOLUTION ANTI FOG 6CC (MISCELLANEOUS) ×3 IMPLANT
SPONGE LAP 18X18 X RAY DECT (DISPOSABLE) ×3 IMPLANT
STAPLER VISISTAT 35W (STAPLE) ×3 IMPLANT
SUT MNCRL AB 4-0 PS2 18 (SUTURE) ×3 IMPLANT
SUT SURGIDAC NAB ES-9 0 48 120 (SUTURE) IMPLANT
SUT VIC AB 4-0 SH 18 (SUTURE) ×3 IMPLANT
SUT VICRYL 0 TIES 12 18 (SUTURE) ×3 IMPLANT
SYR 10ML ECCENTRIC (SYRINGE) ×3 IMPLANT
SYR 20CC LL (SYRINGE) ×3 IMPLANT
SYR 50ML LL SCALE MARK (SYRINGE) ×3 IMPLANT
TOWEL OR 17X26 10 PK STRL BLUE (TOWEL DISPOSABLE) ×6 IMPLANT
TOWEL OR NON WOVEN STRL DISP B (DISPOSABLE) ×3 IMPLANT
TRAY FOLEY W/METER SILVER 16FR (SET/KITS/TRAYS/PACK) IMPLANT
TROCAR ADV FIXATION 5X100MM (TROCAR) ×3 IMPLANT
TROCAR BLADELESS 15MM (ENDOMECHANICALS) ×3 IMPLANT
TROCAR BLADELESS OPT 5 100 (ENDOMECHANICALS) ×3 IMPLANT
TUBE CALIBRATION LAPBAND (TUBING) ×2 IMPLANT
TUBING CONNECTING 10 (TUBING) ×3 IMPLANT
TUBING CONNECTING 10' (TUBING) ×2
TUBING ENDO SMARTCAP (MISCELLANEOUS) ×3 IMPLANT
TUBING INSUF HEATED (TUBING) ×3 IMPLANT

## 2017-05-05 NOTE — Interval H&P Note (Signed)
History and Physical Interval Note:  05/05/2017 11:10 AM  Caitlin Sampson  has presented today for surgery, with the diagnosis of Morbid Obesity, HTN, GERD  The various methods of treatment have been discussed with the patient and family. After consideration of risks, benefits and other options for treatment, the patient has consented to  Procedure(s): LAPAROSCOPIC GASTRIC SLEEVE RESECTION, UPPER ENDO (N/A) as a surgical intervention .  The patient's history has been reviewed, patient examined, no change in status, stable for surgery.  I have reviewed the patient's chart and labs.  Questions were answered to the patient's satisfaction.     [name]

## 2017-05-05 NOTE — Op Note (Signed)
Name:  [name] MRN: [id number] Date of Surgery: 05/05/2017  Preop Diagnosis:  Morbid Obesity  Postop Diagnosis:  Morbid Obesity (Weight - 251, BMI - 45), S/P Gastric Sleeve resection  Procedure:  Upper endoscopy  (Intraoperative)  Surgeon:  Alphonsa Overall, M.D.  Anesthesia:  GET  Indications for procedure: Caitlin Sampson is a 56 y.o. female whose primary care physician is Fayrene Helper, MD and has completed a gastric sleeve resection today for weight loss by Dr. Hassell Done.  I am doing an intraoperative upper endoscopy to evaluate the gastric pouch after the sleeve gastrectomy.  Operative Note: The patient is under general anesthesia.  Dr. Hassell Done is laparoscoping the patient while I do an upper endoscopy to evaluate the stomach pouch.  With the patient intubated, I passed the Pentax upper endoscope without difficulty down the esophagus.  The esophagus was unremarkable.  The esophago-gastric junction was at 40 cm.    The mucosa of the stomach looked viable and the staple line was intact without bleeding.  I advanced the scope to the pylorus, but did not go through it.  While I insufflated the stomach pouch with air, Dr. Hassell Done  flooded the upper abdomen with saline to put the gastric pouch under saline.  There was no bubbling or evidence of a leak.  There was no evidence of narrowing of the pouch and the gastric sleeve looked tubular.  The scope was then withdrawn.  The esophagus was unremarkable and the patient tolerated the endoscopy without difficulty.  Alphonsa Overall, MD, [facility] Pager: [phone] Office phone:  [phone]

## 2017-05-05 NOTE — Anesthesia Procedure Notes (Signed)
Procedure Name: Intubation Date/Time: 05/05/2017 11:36 AM Performed by: West Pugh, CRNA Pre-anesthesia Checklist: Patient identified, Emergency Drugs available, Suction available and Patient being monitored Patient Re-evaluated:Patient Re-evaluated prior to induction Oxygen Delivery Method: Circle system utilized Preoxygenation: Pre-oxygenation with 100% oxygen Induction Type: IV induction Ventilation: Mask ventilation without difficulty Laryngoscope Size: Mac and 3 Grade View: Grade I Tube type: Oral Tube size: 7.0 mm Number of attempts: 1 Airway Equipment and Method: Stylet Placement Confirmation: ETT inserted through vocal cords under direct vision,  positive ETCO2 and breath sounds checked- equal and bilateral Secured at: 21 cm Tube secured with: Tape Dental Injury: Teeth and Oropharynx as per pre-operative assessment

## 2017-05-05 NOTE — Op Note (Signed)
Surgeon: Kaylyn Lim, MD, FACS  Asst:  Alphonsa Overall, MD, FACS  Anes:  General endotracheal  Procedure: Laparoscopic sleeve gastrectomy and upper endoscopy  Diagnosis: Morbid obesity  Complications: none  EBL:   15 cc  Description of Procedure:  The patient was take to OR 2 and given general anesthesia.  The abdomen was prepped with Technicare and draped sterilely.  A timeout was performed.  Access to the abdomen was achieved with a 5 mm Optiview through the left upper quadrant.  Following insufflation, the state of the abdomen was found to be free of adhesions.  No visible dimple was noted.  Balloon test with 10 cc of air was negative.  The ViSiGi 36Fr tube was inserted to deflate the stomach and was pulled back into the esophagus.    The pylorus was identified and we measured 5 cm back and marked the antrum.  At that point we began dissection to take down the greater curvature of the stomach using the Harmonic scalpel.  This dissection was taken all the way up to the left crus.  Posterior attachments of the stomach were also taken down.    The ViSiGi tube was then passed into the antrum and suction applied so that it was snug along the lessor curvature.  The "crow's foot" or incisura was identified.  The sleeve gastrectomy was begun using the [employer] stapler beginning with a 4.5 cm black load followed by a 6 cm black load followed by several applications of a 6 cm purple load.  When the sleeve was complete the tube was taken off suction and insufflated briefly.  The tube was withdrawn.  Upper endoscopy was then performed by Dr. Lucia Gaskins which showed no bleeding or bubbles.     The specimen was extracted through the 15 trocar site.  Wounds were infiltrated with Exparel and closed with 4-0 Monocryl and Dermabond.    Matt B. Hassell Done, [place], [facility], [place]

## 2017-05-05 NOTE — Progress Notes (Signed)
Discussed post op day goals with patient including ambulation, IS, diet progression, pain, and nausea control.  Questions answered. 

## 2017-05-05 NOTE — Anesthesia Preprocedure Evaluation (Signed)
Anesthesia Evaluation  Patient identified by MRN, date of birth, ID band Patient awake    Reviewed: Allergy & Precautions, NPO status , Patient's Chart, lab work & pertinent test results  Airway Mallampati: II  TM Distance: >3 FB Neck ROM: Full    Dental   Pulmonary former smoker,    Pulmonary exam normal        Cardiovascular hypertension, Pt. on medications Normal cardiovascular exam     Neuro/Psych Anxiety    GI/Hepatic GERD  Medicated and Controlled,  Endo/Other  diabetes, Type 2  Renal/GU      Musculoskeletal   Abdominal   Peds  Hematology   Anesthesia Other Findings   Reproductive/Obstetrics                             Anesthesia Physical Anesthesia Plan  ASA: III  Anesthesia Plan: General   Post-op Pain Management:    Induction: Intravenous  PONV Risk Score and Plan: 3 and Midazolam, Dexamethasone and Ondansetron  Airway Management Planned: Oral ETT  Additional Equipment:   Intra-op Plan:   Post-operative Plan: Extubation in OR  Informed Consent: I have reviewed the patients History and Physical, chart, labs and discussed the procedure including the risks, benefits and alternatives for the proposed anesthesia with the patient or authorized representative who has indicated his/her understanding and acceptance.     Plan Discussed with: CRNA and Surgeon  Anesthesia Plan Comments:         Anesthesia Quick Evaluation

## 2017-05-05 NOTE — Anesthesia Postprocedure Evaluation (Signed)
Anesthesia Post Note  Patient: Caitlin Sampson  Procedure(s) Performed: LAPAROSCOPIC GASTRIC SLEEVE RESECTION, UPPER ENDO (N/A Abdomen)     Patient location during evaluation: PACU Anesthesia Type: General Level of consciousness: awake and alert Pain management: pain level controlled Vital Signs Assessment: post-procedure vital signs reviewed and stable Respiratory status: spontaneous breathing, nonlabored ventilation, respiratory function stable and patient connected to nasal cannula oxygen Cardiovascular status: blood pressure returned to baseline and stable Postop Assessment: no apparent nausea or vomiting Anesthetic complications: no    Last Vitals:  Vitals:   05/05/17 1345 05/05/17 1400  BP: 129/76 133/70  Pulse: 84 82  Resp: 17 10  Temp:    SpO2: 98% 96%    Last Pain:  Vitals:   05/05/17 1400  TempSrc:   PainSc: 5                  Ellery Meroney DAVID

## 2017-05-05 NOTE — Transfer of Care (Signed)
Immediate Anesthesia Transfer of Care Note  Patient: Caitlin Sampson  Procedure(s) Performed: LAPAROSCOPIC GASTRIC SLEEVE RESECTION, UPPER ENDO (N/A Abdomen)  Patient Location: PACU  Anesthesia Type:General  Level of Consciousness: awake, alert  and patient cooperative  Airway & Oxygen Therapy: Patient Spontanous Breathing and Patient connected to face mask oxygen  Post-op Assessment: Report given to RN, Post -op Vital signs reviewed and stable and Patient moving all extremities X 4  Post vital signs: Reviewed and stable  Last Vitals:  Vitals:   05/05/17 1013 05/05/17 1319  BP: (!) 147/87 125/65  Pulse: 75 88  Resp: 18 13  Temp: 36.9 C 36.6 C  SpO2: 98% 100%    Last Pain:  Vitals:   05/05/17 1013  TempSrc: Oral      Patients Stated Pain Goal: 4 (09/38/18 2993)  Complications: No apparent anesthesia complications

## 2017-05-06 ENCOUNTER — Encounter (HOSPITAL_COMMUNITY): Payer: Self-pay | Admitting: Surgery

## 2017-05-06 LAB — CBC WITH DIFFERENTIAL/PLATELET
BASOS PCT: 0 %
Basophils Absolute: 0 10*3/uL (ref 0.0–0.1)
EOS ABS: 0 10*3/uL (ref 0.0–0.7)
Eosinophils Relative: 0 %
HCT: 35.6 % — ABNORMAL LOW (ref 36.0–46.0)
Hemoglobin: 11.6 g/dL — ABNORMAL LOW (ref 12.0–15.0)
Lymphocytes Relative: 11 %
Lymphs Abs: 1.4 10*3/uL (ref 0.7–4.0)
MCH: 28.3 pg (ref 26.0–34.0)
MCHC: 32.6 g/dL (ref 30.0–36.0)
MCV: 86.8 fL (ref 78.0–100.0)
MONO ABS: 1.1 10*3/uL — AB (ref 0.1–1.0)
MONOS PCT: 8 %
Neutro Abs: 10.9 10*3/uL — ABNORMAL HIGH (ref 1.7–7.7)
Neutrophils Relative %: 81 %
Platelets: 374 10*3/uL (ref 150–400)
RBC: 4.1 MIL/uL (ref 3.87–5.11)
RDW: 15 % (ref 11.5–15.5)
WBC: 13.3 10*3/uL — ABNORMAL HIGH (ref 4.0–10.5)

## 2017-05-06 LAB — GLUCOSE, CAPILLARY
GLUCOSE-CAPILLARY: 141 mg/dL — AB (ref 65–99)
GLUCOSE-CAPILLARY: 117 mg/dL — AB (ref 65–99)
Glucose-Capillary: 114 mg/dL — ABNORMAL HIGH (ref 65–99)

## 2017-05-06 NOTE — Discharge Instructions (Signed)
° ° ° °GASTRIC BYPASS/SLEEVE ° Home Care Instructions ° ° These instructions are to help you care for yourself when you go home. ° °Call: If you have any problems. °• Call [phone] and ask for the surgeon on call °• If you need immediate help, come to the ER at [facility].  °• Tell the ER staff that you are a new post-op gastric bypass or gastric sleeve patient °  °Signs and symptoms to report: • Severe vomiting or nausea °o If you cannot keep down clear liquids for longer than 1 day, call your surgeon  °• Abdominal pain that does not get better after taking your pain medication °• Fever over 100.4° F with chills °• Heart beating over 100 beats a minute °• Shortness of breath at rest °• Chest pain °•  Redness, swelling, drainage, or foul odor at incision (surgical) sites °•  If your incisions open or pull apart °• Swelling or pain in calf (lower leg) °• Diarrhea (Loose bowel movements that happen often), frequent watery, uncontrolled bowel movements °• Constipation, (no bowel movements for 3 days) if this happens: Pick one °o Milk of Magnesia, 2 tablespoons by mouth, 3 times a day for 2 days if needed °o Stop taking Milk of Magnesia once you have a bowel movement °o Call your doctor if constipation continues °Or °o Miralax  (instead of Milk of Magnesia) following the label instructions °o Stop taking Miralax once you have a bowel movement °o Call your doctor if constipation continues °• Anything you think is not normal °  °Normal side effects after surgery: • Unable to sleep at night or unable to focus °• Irritability or moody °• Being tearful (crying) or depressed °These are common complaints, possibly related to your anesthesia medications that put you to sleep, stress of surgery, and change in lifestyle.  This usually goes away a few weeks after surgery.  If these feelings continue, call your primary care doctor. °  °Wound Care: You may have surgical glue, steri-strips, or staples over your incisions after  surgery °• Surgical glue:  Looks like a clear film over your incisions and will wear off a little at a time °• Steri-strips: Strips of tape over your incisions. You may notice a yellowish color on the skin under the steri-strips. This is used to make the   steri-strips stick better. Do not pull the steri-strips off - let them fall off °• Staples: Staples may be removed before you leave the hospital °o If you go home with staples, call [facility], [phone] at for an appointment with your surgeon’s nurse to have staples removed 10 days after surgery. °• Showering: You may shower two (2) days after your surgery unless your surgeon tells you differently °o Wash gently around incisions with warm soapy water, rinse well, and gently pat dry  °o No tub baths until staples are removed, steri-strips fall off or glue is gone.  °  °Medications: • Medications should be liquid or crushed if larger than the size of a dime °• Extended release pills (medication that release a little bit at a time through the day) should NOT be crushed or cut. (examples include XL, ER, DR, SR) °• Depending on the size and number of medications you take, you may need to space (take a few throughout the day)/change the time you take your medications so that you do not over-fill your pouch (smaller stomach) °• Make sure you follow-up with your primary care doctor to   make medication changes needed during rapid weight loss and life-style changes °• If you have diabetes, follow up with the doctor that orders your diabetes medication(s) within one week after surgery and check your blood sugar regularly. °• Do not drive while taking prescription pain medication  °• It is ok to take Tylenol by the bottle instructions with your pain medicine or instead of your pain medicine as needed.  DO NOT TAKE NSAIDS (EXAMPLES OF NSAIDS:  IBUPROFREN/ NAPROXEN)  °Diet:                    First 2 Weeks ° You will see the dietician t about two (2) weeks  after your surgery. The dietician will increase the types of foods you can eat if you are handling liquids well: °• If you have severe vomiting or nausea and cannot keep down clear liquids lasting longer than 1 day, call your surgeon @ ([phone]) °Protein Shake °• Drink at least 2 ounces of shake 5-6 times per day °• Each serving of protein shakes (usually 8 - 12 ounces) should have: °o 15 grams of protein  °o And no more than 5 grams of carbohydrate  °• Goal for protein each day: °o Men = 80 grams per day °o Women = 60 grams per day °• Protein powder may be added to fluids such as non-fat milk or Lactaid milk or unsweetened Soy/Almond milk (limit to 35 grams added protein powder per serving) ° °Hydration °• Slowly increase the amount of water and other clear liquids as tolerated (See Acceptable Fluids) °• Slowly increase the amount of protein shake as tolerated  °•  Sip fluids slowly and throughout the day.  Do not use straws. °• May use sugar substitutes in small amounts (no more than 6 - 8 packets per day; i.e. Splenda) ° °Fluid Goal °• The first goal is to drink at least 8 ounces of protein shake/drink per day (or as directed by the nutritionist); some examples of protein shakes are Syntrax Nectar, Adkins Advantage, EAS Edge HP, and Unjury. See handout from pre-op Bariatric Education Class: °o Slowly increase the amount of protein shake you drink as tolerated °o You may find it easier to slowly sip shakes throughout the day °o It is important to get your proteins in first °• Your fluid goal is to drink 64 - 100 ounces of fluid daily °o It may take a few weeks to build up to this °• 32 oz (or more) should be clear liquids  °And  °• 32 oz (or more) should be full liquids (see below for examples) °• Liquids should not contain sugar, caffeine, or carbonation ° °Clear Liquids: °• Water or Sugar-free flavored water (i.e. Fruit H2O, Propel) °• Decaffeinated coffee or tea (sugar-free) °• Crystal Lite, Wyler’s Lite,  Minute Maid Lite °• Sugar-free Jell-O °• Bouillon or broth °• Sugar-free Popsicle:   *Less than 20 calories each; Limit 1 per day ° °Full Liquids: °Protein Shakes/Drinks + 2 choices per day of other full liquids °• Full liquids must be: °o No More Than 15 grams of Carbs per serving  °o No More Than 3 grams of Fat per serving °• Strained low-fat cream soup (except Cream of Potato or Tomato) °• Non-Fat milk °• Fat-free Lactaid Milk °• Unsweetened Soy Or Unsweetened Almond Milk °• Low Sugar yogurt (Dannon Lite & Fit, Greek yogurt; Oikos Triple Zero; Chobani Simply 100; Yoplait 100 calorie Greek - No Fruit on the Bottom) ° °  °Vitamins   and Minerals • Start 1 day after surgery unless otherwise directed by your surgeon °• 2 Chewable Bariatric Specific Multivitamin / Multimineral Supplement with iron (Example: Bariatric Advantage Multi EA) °• Chewable Calcium with Vitamin D-3 °(Example: 3 Chewable Calcium Plus 600 with Vitamin D-3) °o Take 500 mg three (3) times a day for a total of 1500 mg each day °o Do not take all 3 doses of calcium at one time as it may cause constipation, and you can only absorb 500 mg  at a time  °o Do not mix multivitamins containing iron with calcium supplements; take 2 hours apart °• Menstruating women and those with a history of anemia (a blood disease that causes weakness) may need extra iron °o Talk with your doctor to see if you need more iron °• Do not stop taking or change any vitamins or minerals until you talk to your dietitian or surgeon °• Your Dietitian and/or surgeon must approve all vitamin and mineral supplements °  °Activity and Exercise: Limit your physical activity as instructed by your doctor.  It is important to continue walking at home.  During this time, use these guidelines: °• Do not lift anything greater than ten (10) pounds for at least two (2) weeks °• Do not go back to work or drive until your surgeon says you can °• You may have sex when you feel comfortable  °o It is  VERY important for female patients to use a reliable birth control method; fertility often increases after surgery  °o All hormonal birth control will be ineffective for 30 days after surgery due to medications given during surgery a barrier method must be used. °o Do not get pregnant for at least 18 months °• Start exercising as soon as your doctor tells you that you can °o Make sure your doctor approves any physical activity °• Start with a simple walking program °• Walk 5-15 minutes each day, 7 days per week.  °• Slowly increase until you are walking 30-45 minutes per day °Consider joining our BELT program. [phone] or email [email or web address] °  °Special Instructions Things to remember: °• Use your CPAP when sleeping if this applies to you ° °• [facility] has two free Bariatric Surgery Support Groups that meet monthly °o The 3rd Thursday of each month, 6 pm, [facility]  °o The 2nd Friday of each month, 11:45 am in the private dining room in the basement of [facility] °• It is very important to keep all follow up appointments with your surgeon, dietitian, primary care physician, and behavioral health practitioner °• Routine follow up schedule with your surgeon include appointments at 2-3 weeks, 6-8 weeks, 6 months, and 1 year at a minimum.  Your surgeon may request to see you more often.   °o After the first year, please follow up with your bariatric surgeon and dietitian at least once a year in order to maintain best weight loss results °[facility]: [phone] °[facility]: [phone] °Bariatric Nurse Coordinator: [phone] °  °   Reviewed and Endorsed  °by  Patient Education Committee, June, 2016 °Edits Approved: Aug, 2018 ° ° ° °

## 2017-05-06 NOTE — Progress Notes (Signed)
Patient alert and oriented, Post op day 1.  Provided support and encouragement.  Encouraged pulmonary toilet, ambulation and small sips of liquids.  Completed 12 ounces of clear fluids and started protein.  All questions answered.  Will continue to monitor.

## 2017-05-06 NOTE — Progress Notes (Signed)
Patient alert and oriented, pain is controlled. Patient is tolerating fluids, advanced to protein shake today, patient is tolerating well.  Reviewed Gastric sleeve discharge instructions with patient and patient is able to articulate understanding.  Provided information on BELT program, Support Group and [facility] outpatient pharmacy. All questions answered, will continue to monitor.  

## 2017-05-06 NOTE — Plan of Care (Signed)
Nutrition Education Note  Received consult for diet education per DROP protocol.   Discussed 2 week post op diet with pt. Emphasized that liquids must be non carbonated, non caffeinated, and sugar free. Fluid goals discussed. Pt to follow up with outpatient bariatric RD for further diet progression after 2 weeks. Multivitamins and minerals also reviewed. Teach back method used, pt expressed understanding, expect good compliance.   Diet: First 2 Weeks  You will see the nutritionist about two (2) weeks after your surgery. The nutritionist will increase the types of foods you can eat if you are handling liquids well:  If you have severe vomiting or nausea and cannot handle clear liquids lasting longer than 1 day, call your surgeon  Protein Shake  Drink at least 2 ounces of shake 5-6 times per day  Each serving of protein shakes (usually 8 - 12 ounces) should have a minimum of:  15 grams of protein  And no more than 5 grams of carbohydrate  Goal for protein each day:  Men = 80 grams per day  Women = 60 grams per day  Protein powder may be added to fluids such as non-fat milk or Lactaid milk or Soy milk (limit to 35 grams added protein powder per serving)   Hydration  Slowly increase the amount of water and other clear liquids as tolerated (See Acceptable Fluids)  Slowly increase the amount of protein shake as tolerated  Sip fluids slowly and throughout the day  May use sugar substitutes in small amounts (no more than 6 - 8 packets per day; i.e. Splenda)   Fluid Goal  The first goal is to drink at least 8 ounces of protein shake/drink per day (or as directed by the nutritionist); some examples of protein shakes are Premier Protein, Syntrax Nectar, Adkins Advantage, EAS Edge HP, and Unjury. See handout from pre-op Bariatric Education Class:  Slowly increase the amount of protein shake you drink as tolerated  You may find it easier to slowly sip shakes throughout the day  It is important to  get your proteins in first  Your fluid goal is to drink 64 - 100 ounces of fluid daily  It may take a few weeks to build up to this  32 oz (or more) should be clear liquids  And  32 oz (or more) should be full liquids (see below for examples)  Liquids should not contain sugar, caffeine, or carbonation   Clear Liquids:  Water or Sugar-free flavored water (i.e. Fruit H2O, Propel)  Decaffeinated coffee or tea (sugar-free)  Crystal Lite, Wyler?s Lite, Minute Maid Lite  Sugar-free Jell-O  Bouillon or broth  Sugar-free Popsicle: *Less than 20 calories each; Limit 1 per day   Full Liquids:  Protein Shakes/Drinks + 2 choices per day of other full liquids  Full liquids must be:  No More Than 12 grams of Carbs per serving  No More Than 3 grams of Fat per serving  Strained low-fat cream soup  Non-Fat milk  Fat-free Lactaid Milk  Sugar-free yogurt (Dannon Lite & Fit, Greek yogurt, Oikos Zero)   Tayvin Preslar, MS, RD, LDN [facility] Clinical Dietitian Pager: [phone] After Hours Pager: [phone]   

## 2017-05-06 NOTE — Discharge Summary (Signed)
Physician Discharge Summary  Patient ID: Caitlin Sampson MRN: [id number] DOB/AGE: 09/01/1960 56 y.o.  Admit date: 05/05/2017 Discharge date: 05/06/2017  Admission Diagnoses:  Morbid obesity  Discharge Diagnoses:  same  Principal Problem:   S/P laparoscopic sleeve gastrectomy Dec 2018   Surgery:  Sleeve gastrectomy  Discharged Condition: good  Hospital Course:   Had surgery  And begun on liquids and ready for discharge on PD 1  Consults: none  Significant Diagnostic Studies: none    Discharge Exam: Blood pressure (!) 146/77, pulse 63, temperature 99.4 F (37.4 C), temperature source Oral, resp. rate 16, height 5\' 2"  (1.575 m), weight 111.1 kg (245 lb), SpO2 98 %. incisionsOK  Disposition: 01-Home or Self Care  Discharge Instructions    Ambulate hourly while awake   Complete by:  As directed    Call MD for:  difficulty breathing, headache or visual disturbances   Complete by:  As directed    Call MD for:  persistant dizziness or light-headedness   Complete by:  As directed    Call MD for:  persistant nausea and vomiting   Complete by:  As directed    Call MD for:  redness, tenderness, or signs of infection (pain, swelling, redness, odor or green/yellow discharge around incision site)   Complete by:  As directed    Call MD for:  severe uncontrolled pain   Complete by:  As directed    Call MD for:  temperature >101 F   Complete by:  As directed    Diet bariatric full liquid   Complete by:  As directed    Incentive spirometry   Complete by:  As directed    Perform hourly while awake     Allergies as of 05/06/2017      Reactions   Ivp Dye [iodinated Diagnostic Agents] Itching   Vytorin [ezetimibe-simvastatin] Other (See Comments)   Joint pain      Medication List    STOP taking these medications   azelastine 0.1 % nasal spray Commonly known as:  ASTELIN     TAKE these medications   acetaminophen 500 MG tablet Commonly known as:  TYLENOL Take 1,000 mg by  mouth every 6 (six) hours as needed for moderate pain or headache.   aspirin 81 MG tablet Take 81 mg by mouth at bedtime.   BARIATRIC MULTIVITAMINS/IRON PO Take 1 tablet by mouth daily.   FLUoxetine 40 MG capsule Commonly known as:  PROZAC TAKE 1 CAPSULE BY MOUTH  DAILY What changed:    how much to take  how to take this  when to take this   losartan-hydrochlorothiazide 100-12.5 MG tablet Commonly known as:  HYZAAR Take 1 tablet by mouth daily. What changed:  when to take this   metFORMIN 500 MG tablet Commonly known as:  GLUCOPHAGE Take 1 tablet (500 mg total) by mouth daily with breakfast. Notes to patient:  Monitor Blood Sugar Frequently and keep a log for primary care physician, you may need to adjust medication dosage with rapid weight loss.     pantoprazole 40 MG tablet Commonly known as:  PROTONIX Take 1 tablet (40 mg total) by mouth daily. What changed:  when to take this   potassium chloride 10 MEQ tablet Commonly known as:  K-DUR,KLOR-CON Take 1 tablet (10 mEq total) by mouth daily.   rosuvastatin 10 MG tablet Commonly known as:  CRESTOR TAKE 1 TABLET BY MOUTH  DAILY What changed:    how much to take  how  to take this  when to take this      Follow-up Information    [facility], [place]. Go on 05/20/2017.   Specialty:  General Surgery Why:  at 145 Contact information: [address] [place] [address] [phone]        [facility], [place] Follow up.   Specialty:  General Surgery Contact information: [address] [place] [address] [phone]           Signed: [name] 05/06/2017, 12:26 PM

## 2017-05-06 NOTE — Progress Notes (Signed)
Patient discharged to home. Discharge instruction given,All question answered. Patient is   Tolerating her diet,,ambulating & voiding well. No any complaints. PRN Oxycodone administered for pain

## 2017-05-07 ENCOUNTER — Telehealth: Payer: Self-pay | Admitting: Family Medicine

## 2017-05-07 LAB — PTH, INTACT AND CALCIUM
Calcium, Total (PTH): 10.3 mg/dL — ABNORMAL HIGH (ref 8.7–10.2)
PTH: 69 pg/mL — AB (ref 15–65)

## 2017-05-07 NOTE — Telephone Encounter (Signed)
Transition Care Management Follow-up Telephone Call   Date discharged?    05/06/17            How have you been since you were released from the hospital? She states she is sore and weak but has improved since yesterday   Do you understand why you were in the hospital? She does understand   Do you understand the discharge instructions? Patient states instructions were thoroughly explained to her in the hospital and she has no concerns   Where were you discharged to? Home   Items Reviewed:  Medications reviewed: Yes  Allergies reviewed: Yes  Dietary changes reviewed: Patient states or two weeks she is on a liquid protein shake as tolerated.  Referrals reviewed: No referrals placed   Functional Questionnaire:   Activities of Daily Living (ADLs):  Patient is able to complete ADLs as prior- independently. She states he husband and mother are home with her and she is bathing/dressing/toileting well, getting more active and doing more as soreness decreases.    Any transportation issues/concerns?: No   Any patient concerns? No, will call if she has questions.   Confirmed importance and date/time of follow-up visits scheduled Yes   05/18/17 at 12:20 pm  Confirmed with patient if condition begins to worsen call PCP or go to the ER.  Patient was given the office number and encouraged to call back with question or concerns.  : Yes

## 2017-05-13 ENCOUNTER — Telehealth (HOSPITAL_COMMUNITY): Payer: Self-pay

## 2017-05-13 NOTE — Telephone Encounter (Addendum)
Follow up with bariatric surgical patient to discuss post discharge questions.  No answer at this time voicemail left for patient along with contact information to discuss the following questions.    Returned call 12/26 at 1135  1.  Are you having any pain not relieved by pain medication?has not needed pain medicaiton  2.  How much fluid total fluid intake have you had in the last 24/48 hours?  66+ fluid  3.  How much protein intake have you had in the last 24/48 hours?60 grams of protein  4.  Have you had any trouble making urine?no  5.  Have you had nausea that has not been relieved by nausea medication?has not needed nausea medication  6.  Are you ambulating every hour?yes  7.  Are you passing gas or had a BM?took miralax but had bm  8.  Do you know how to contact [facility]? [facility]? [facility]?yes, plans to call to reschedule post op nutrition  9.  Are you taking your vitamins and calcium without difficulty?no problems  10. Tell me how your incision looks?  Any redness, open incision, or drainage?look good, has staples in upper incision to be removed with Dr Hassell Done office

## 2017-05-18 ENCOUNTER — Encounter: Payer: Self-pay | Admitting: Family Medicine

## 2017-05-18 ENCOUNTER — Ambulatory Visit (INDEPENDENT_AMBULATORY_CARE_PROVIDER_SITE_OTHER): Payer: 59 | Admitting: Family Medicine

## 2017-05-18 VITALS — Resp 14 | Ht 62.0 in | Wt 235.0 lb

## 2017-05-18 DIAGNOSIS — E1169 Type 2 diabetes mellitus with other specified complication: Secondary | ICD-10-CM | POA: Diagnosis not present

## 2017-05-18 DIAGNOSIS — E782 Mixed hyperlipidemia: Secondary | ICD-10-CM

## 2017-05-18 DIAGNOSIS — E119 Type 2 diabetes mellitus without complications: Secondary | ICD-10-CM

## 2017-05-18 DIAGNOSIS — E669 Obesity, unspecified: Secondary | ICD-10-CM

## 2017-05-18 DIAGNOSIS — I10 Essential (primary) hypertension: Secondary | ICD-10-CM

## 2017-05-18 DIAGNOSIS — Z09 Encounter for follow-up examination after completed treatment for conditions other than malignant neoplasm: Secondary | ICD-10-CM

## 2017-05-18 NOTE — Patient Instructions (Addendum)
Nurse BP check in 4 weeks  MD f/u in 4.5 months, call if you need me sooner  Lab order sheet will be faxed to [facility] for 1 week before MD f/u fasting lipid, cmp and eGFR,HBA1C , and vit D   Take one half lorsartan/ hctz once daily  Stop metformin  Continue all other meds as before and as listed

## 2017-05-19 ENCOUNTER — Encounter: Payer: Self-pay | Admitting: Family Medicine

## 2017-05-19 DIAGNOSIS — Z09 Encounter for follow-up examination after completed treatment for conditions other than malignant neoplasm: Secondary | ICD-10-CM | POA: Insufficient documentation

## 2017-05-19 NOTE — Assessment & Plan Note (Signed)
Hospital course reviewed with patient, this was uncomplicated, surgery , sleeve gastrectomy. She is tolerating her current liquid diet well,  Weight loss is appropriate , and surgical wounds are healing well. She is light headed and mildly hypotensive on current antihypertensive regime , so medication is reduced by 50%, return for nurse BP check in 6 weeks Metformin has already been discontinued Follow up with surgery and bariatric team per routine

## 2017-05-19 NOTE — Progress Notes (Signed)
   Caitlin Sampson     MRN: [id number]      DOB: May 09, 1961   HPI Caitlin Sampson is here for follow up from recent hospitalization from gastric sleeve done 05/05/2017.remained in hospital overnight , post op course has been uncomplicated. Has been doing well as far as nausea and post op pain. Denies fever or chills. Wounds are healing well overall, no purulent drainage or erythema She has discontinued metformin , and notes that she has been experiencing light headedness, especially with change in position ROS See HPI  Denies recent fever or chills. Denies sinus pressure, nasal congestion, ear pain or sore throat. Denies chest congestion, productive cough or wheezing. Denies chest pains, palpitations and leg swelling Denies abdominal pain, nausea, vomiting,diarrhea or constipation.   Denies dysuria, frequency, hesitancy or incontinence. Denies joint pain, swelling and limitation in mobility. Denies headaches, seizures, numbness, or tingling. Denies depression, anxiety or insomnia. Denies skin break down or rash.   PE  Resp 14   Ht 5\' 2"  (1.575 m)   Wt 235 lb (106.6 kg)   SpO2 97%   BMI 42.98 kg/m     Patient alert and oriented and in no cardiopulmonary distress.  HEENT: No facial asymmetry, EOMI,   oropharynx pink and moist.  Neck supple no JVD, no mass.  Chest: Clear to auscultation bilaterally.  CVS: S1, S2 no murmurs, no S3.Regular rate.  ABD: Soft non tender.   Ext: No edema  MS: Adequate ROM spine, shoulders, hips and knees.  Skin: surgical wounds healing well, npo erythema or drainage noited Psych: Good eye contact, normal affect. Memory intact not anxious or depressed appearing.  CNS: CN 2-12 intact, power,  normal throughout.no focal deficits noted.   [facility] discharge follow-up Hospital course reviewed with patient, this was uncomplicated, surgery , sleeve gastrectomy. She is tolerating her current liquid diet well,  Weight loss is  appropriate , and surgical wounds are healing well. She is light headed and mildly hypotensive on current antihypertensive regime , so medication is reduced by 50%, return for nurse BP check in 6 weeks Metformin has already been discontinued Follow up with surgery and bariatric team per routine  Essential hypertension Over corrected and symptomatic , medication dose reduced  DASH diet and commitment to daily physical activity for a minimum of 30 minutes discussed and encouraged, as a part of hypertension management. The importance of attaining a healthy weight is also discussed.  BP/Weight 05/18/2017 05/06/2017 05/05/2017 05/04/2017 03/23/2017 03/10/2017 37/12/5883  Systolic BP - 027 - 741 - - 287  Diastolic BP - 77 - 84 - - 78  Wt. (Lbs) 235 246.1 - 242 251.6 252 -  BMI 42.98 - 45.01 44.26 46.02 46.09 -   Nurse bP recheck in 4 to 6 weeks    MORBID OBESITY Improved. Patient re-educated about  the importance of commitment to a  minimum of 150 minutes of exercise per week.  The importance of healthy food choices with portion control discussed. Encouraged to start a food diary, count calories and to consider  joining a support group. Sample diet sheets offered. Goals set by the patient for the next several months.   Weight /BMI 05/18/2017 05/06/2017 05/05/2017  WEIGHT 235 lb 246 lb 1.6 oz -  HEIGHT 5\' 2"  - 5\' 2"   BMI 42.98 kg/m2 - 45.01 kg/m2

## 2017-05-19 NOTE — Assessment & Plan Note (Signed)
Improved. Patient re-educated about  the importance of commitment to a  minimum of 150 minutes of exercise per week.  The importance of healthy food choices with portion control discussed. Encouraged to start a food diary, count calories and to consider  joining a support group. Sample diet sheets offered. Goals set by the patient for the next several months.   Weight /BMI 05/18/2017 05/06/2017 05/05/2017  WEIGHT 235 lb 246 lb 1.6 oz -  HEIGHT 5\' 2"  - 5\' 2"   BMI 42.98 kg/m2 - 45.01 kg/m2

## 2017-05-19 NOTE — Assessment & Plan Note (Signed)
Over corrected and symptomatic , medication dose reduced  DASH diet and commitment to daily physical activity for a minimum of 30 minutes discussed and encouraged, as a part of hypertension management. The importance of attaining a healthy weight is also discussed.  BP/Weight 05/18/2017 05/06/2017 05/05/2017 05/04/2017 03/23/2017 03/10/2017 65/10/8125  Systolic BP - 517 - 001 - - 749  Diastolic BP - 77 - 84 - - 78  Wt. (Lbs) 235 246.1 - 242 251.6 252 -  BMI 42.98 - 45.01 44.26 46.02 46.09 -   Nurse bP recheck in 4 to 6 weeks

## 2017-05-20 ENCOUNTER — Encounter: Payer: Self-pay | Admitting: Skilled Nursing Facility1

## 2017-05-20 ENCOUNTER — Encounter: Payer: 59 | Attending: Family Medicine | Admitting: Skilled Nursing Facility1

## 2017-05-20 DIAGNOSIS — Z6841 Body Mass Index (BMI) 40.0 and over, adult: Secondary | ICD-10-CM | POA: Diagnosis not present

## 2017-05-20 DIAGNOSIS — E669 Obesity, unspecified: Secondary | ICD-10-CM

## 2017-05-20 DIAGNOSIS — E1169 Type 2 diabetes mellitus with other specified complication: Secondary | ICD-10-CM | POA: Insufficient documentation

## 2017-05-20 DIAGNOSIS — Z713 Dietary counseling and surveillance: Secondary | ICD-10-CM | POA: Insufficient documentation

## 2017-05-20 NOTE — Progress Notes (Signed)
Bariatric Class:  Appt start time: 1530 end time:  1630.  2 Week Post-Operative Nutrition Class  Patient was seen on 05/20/2016 for Post-Operative Nutrition education [facility].   Surgery date: 05/05/2017 Surgery type: sleeve Start weight at [facility]: 252 Weight today: 234.8  TANITA  BODY COMP RESULTS  05/20/2016   BMI (kg/m^2) 42.9   Fat Mass (lbs) 121.2   Fat Free Mass (lbs) 113.6   Total Body Water (lbs) 82.8   The following the learning objectives were met by the patient during this course:  Identifies Phase 3A (Soft, High Proteins) Dietary Goals and will begin from 2 weeks post-operatively to 2 months post-operatively  Identifies appropriate sources of fluids and proteins   States protein recommendations and appropriate sources post-operatively  Identifies the need for appropriate texture modifications, mastication, and bite sizes when consuming solids  Identifies appropriate multivitamin and calcium sources post-operatively  Describes the need for physical activity post-operatively and will follow MD recommendations  States when to call healthcare provider regarding medication questions or post-operative complications  Handouts given during class include:  Phase 3A: Soft, High Protein Diet Handout  Follow-Up Plan: Patient will follow-up at [facility] in 6 weeks for 2 month post-op nutrition visit for diet advancement per MD.

## 2017-06-24 ENCOUNTER — Ambulatory Visit: Payer: 59 | Admitting: Family Medicine

## 2017-06-30 ENCOUNTER — Ambulatory Visit: Payer: 59 | Admitting: Family Medicine

## 2017-07-06 ENCOUNTER — Encounter: Payer: 59 | Attending: Family Medicine | Admitting: Registered"

## 2017-07-06 ENCOUNTER — Encounter: Payer: Self-pay | Admitting: Registered"

## 2017-07-06 ENCOUNTER — Ambulatory Visit: Payer: 59 | Admitting: Registered"

## 2017-07-06 DIAGNOSIS — Z713 Dietary counseling and surveillance: Secondary | ICD-10-CM | POA: Insufficient documentation

## 2017-07-06 DIAGNOSIS — E1169 Type 2 diabetes mellitus with other specified complication: Secondary | ICD-10-CM | POA: Insufficient documentation

## 2017-07-06 DIAGNOSIS — E119 Type 2 diabetes mellitus without complications: Secondary | ICD-10-CM

## 2017-07-06 DIAGNOSIS — Z6841 Body Mass Index (BMI) 40.0 and over, adult: Secondary | ICD-10-CM | POA: Diagnosis not present

## 2017-07-06 NOTE — Progress Notes (Signed)
Follow-up visit:  8 Weeks Post-Operative Sleeve gastrectomy Surgery  Medical Nutrition Therapy:  Appt start time: 12:00 end time:  12:35.  Primary concerns today: Post-operative Bariatric Surgery Nutrition Management.  Non scale victories: increased energy, sleeps better, can tie shoes comfortably, can cross legs, clothes are fitting better, feel better  Surgery date: 05/05/2017 Surgery type: sleeve Start weight at [facility]: 252 Weight today: 216.0 Weight change: 18.8 lbs from 234.8 (05/20/2017) Total weight lost: 36 lbs Weight loss goal: reduce medications, get healthier   TANITA  BODY COMP RESULTS  05/20/2016 07/06/2017   BMI (kg/m^2) 42.9 39.5   Fat Mass (lbs) 121.2 106.0   Fat Free Mass (lbs) 113.6 110.0   Total Body Water (lbs) 82.8 79.4   Pt is talkative. Pt states she wants to get more active. Pt states she drinks at least 64 ounces at least 5 days/week. Pt states Celebrate multivitamins make her stomach burn; forgets to take multivitamin at night sometimes. Pt states she has not been checking BS since having surgery. Pt was given [place]  Multivitamin: Lot # [id number] Exp 11/2018  Preferred Learning Style:   No preference indicated   Learning Readiness:   Ready  Change in progress  24-hr recall: B (AM): 1-2 eggs (6-12g), sometimes with cheese (7g) or protein shake (30g) or greek yogurt (15g) Snk (AM): string cheese (7g)  L (PM): 2-3 oz grilled chicken (21g), 1/4 c beans (7g) or deli meat with cheese  Snk (PM): sugar-free jello or sugar-free popsicles  D (PM): roast (14g), pintos (7g) Snk (PM): none  Fluid intake: water, powerade zero, premier protein clear drink, lactose-free milk, unsweetened tea; most days 64+ ounces Estimated total protein intake: 60+ grams  Medications: See list; reduced blood pressure medications, no metformin Supplementation: Celebrate + 3 TUMS  CBG monitoring: no Average CBG per patient: N/A Last patient reported A1c: 6.2  Using  straws: no Drinking while eating: no Having you been chewing well: yes Chewing/swallowing difficulties: no Changes in vision: no Changes to mood/headaches: no Hair loss/Changes to skin/Changes to nails: no, no, dry nails Any difficulty focusing or concentrating: no Sweating: no Dizziness/Lightheaded: no Palpitations: no  Carbonated beverages: no N/V/D/C/GAS: no, once with beef, no, yes-taking Miralax, no Abdominal Pain: no Dumping syndrome: no Last Lap-Band fill: N/A  Recent physical activity:  Weight bearing exercises and stretching 15 min, 3 days/week  Progress Towards Goal(s):  In progress.  Handouts given during visit include:  Phase IV: High protein + NS vegetables   Nutritional Diagnosis:  [place]-3.3 Overweight/obesity related to past poor dietary habits and physical inactivity as evidenced by patient w/ recent sleeve gastrectomy surgery following dietary guidelines for continued weight loss.     Intervention:  Nutrition education and counseling. Goals:  Follow Phase 3B: High Protein + Non-Starchy Vegetables  Eat 3-6 small meals/snacks, every 3-5 hrs  Increase lean protein foods to meet 60g goal  Increase fluid intake to 64oz +  Avoid drinking 15 minutes before, during and 30 minutes after eating  Aim for >30 min of physical activity daily  - Try [employer] multivitamin sample.   Teaching Method Utilized:  Visual Auditory Hands on  Barriers to learning/adherence to lifestyle change: none identified  Demonstrated degree of understanding via:  Teach Back   Monitoring/Evaluation:  Dietary intake, exercise, lap band fills, and body weight. Follow up in 4 months for 6 month post-op visit.

## 2017-07-06 NOTE — Patient Instructions (Signed)
Goals:  Follow Phase 3B: High Protein + Non-Starchy Vegetables  Eat 3-6 small meals/snacks, every 3-5 hrs  Increase lean protein foods to meet 60g goal  Increase fluid intake to 64oz +  Avoid drinking 15 minutes before, during and 30 minutes after eating  Aim for >30 min of physical activity daily  - Try [employer] multivitamin sample.

## 2017-07-20 ENCOUNTER — Telehealth: Payer: Self-pay | Admitting: Family Medicine

## 2017-07-20 NOTE — Telephone Encounter (Signed)
Pt needs RX called in for 90 days not 30 days  For Mail Order. Also you will need to get authorization for Pantoprazole so this can be 90 days also

## 2017-07-21 ENCOUNTER — Other Ambulatory Visit: Payer: Self-pay

## 2017-07-21 MED ORDER — PANTOPRAZOLE SODIUM 40 MG PO TBEC: 40.0000 mg | DELAYED_RELEASE_TABLET | Freq: Every day | ORAL | 3 refills | Status: DC

## 2017-07-21 NOTE — Telephone Encounter (Signed)
rx sent in 

## 2017-07-22 ENCOUNTER — Other Ambulatory Visit: Payer: Self-pay

## 2017-07-22 MED ORDER — LOSARTAN POTASSIUM-HCTZ 100-12.5 MG PO TABS: ORAL_TABLET | ORAL | 3 refills | Status: DC

## 2017-07-22 MED ORDER — ROSUVASTATIN CALCIUM 10 MG PO TABS: 10.0000 mg | ORAL_TABLET | Freq: Every day | ORAL | 1 refills | Status: DC

## 2017-08-08 ENCOUNTER — Other Ambulatory Visit: Payer: Self-pay | Admitting: Family Medicine

## 2017-08-12 ENCOUNTER — Other Ambulatory Visit: Payer: Self-pay

## 2017-08-12 MED ORDER — FLUOXETINE HCL 40 MG PO CAPS: 40.0000 mg | ORAL_CAPSULE | Freq: Every day | ORAL | 1 refills | Status: DC

## 2017-09-18 LAB — CMP14+EGFR
ALT: 14 IU/L (ref 0–32)
AST: 18 IU/L (ref 0–40)
Albumin/Globulin Ratio: 2 (ref 1.2–2.2)
Albumin: 4.3 g/dL (ref 3.5–5.5)
Alkaline Phosphatase: 84 IU/L (ref 39–117)
BILIRUBIN TOTAL: 0.4 mg/dL (ref 0.0–1.2)
BUN/Creatinine Ratio: 16 (ref 9–23)
BUN: 11 mg/dL (ref 6–24)
CALCIUM: 10.5 mg/dL — AB (ref 8.7–10.2)
CHLORIDE: 102 mmol/L (ref 96–106)
CO2: 26 mmol/L (ref 20–29)
Creatinine, Ser: 0.68 mg/dL (ref 0.57–1.00)
GFR calc Af Amer: 115 mL/min/{1.73_m2} (ref >59)
GFR calc non Af Amer: 99 mL/min/{1.73_m2} (ref >59)
GLUCOSE: 99 mg/dL (ref 65–99)
Globulin, Total: 2.1 g/dL (ref 1.5–4.5)
Potassium: 3.8 mmol/L (ref 3.5–5.2)
Sodium: 143 mmol/L (ref 134–144)
Total Protein: 6.4 g/dL (ref 6.0–8.5)

## 2017-09-18 LAB — HEMOGLOBIN A1C
ESTIMATED AVERAGE GLUCOSE: 123 mg/dL
Hgb A1c MFr Bld: 5.9 % — ABNORMAL HIGH (ref 4.8–5.6)

## 2017-09-18 LAB — VITAMIN D 25 HYDROXY (VIT D DEFICIENCY, FRACTURES): VIT D 25 HYDROXY: 43.4 ng/mL (ref 30.0–100.0)

## 2017-09-23 ENCOUNTER — Ambulatory Visit: Payer: 59 | Admitting: Family Medicine

## 2017-09-23 ENCOUNTER — Encounter: Payer: Self-pay | Admitting: Family Medicine

## 2017-09-23 VITALS — BP 118/78 | HR 66 | Resp 16 | Ht 62.0 in | Wt 196.0 lb

## 2017-09-23 DIAGNOSIS — M791 Myalgia, unspecified site: Secondary | ICD-10-CM | POA: Diagnosis not present

## 2017-09-23 DIAGNOSIS — K219 Gastro-esophageal reflux disease without esophagitis: Secondary | ICD-10-CM | POA: Diagnosis not present

## 2017-09-23 DIAGNOSIS — F411 Generalized anxiety disorder: Secondary | ICD-10-CM | POA: Diagnosis not present

## 2017-09-23 DIAGNOSIS — I10 Essential (primary) hypertension: Secondary | ICD-10-CM

## 2017-09-23 DIAGNOSIS — E8881 Metabolic syndrome: Secondary | ICD-10-CM | POA: Diagnosis not present

## 2017-09-23 DIAGNOSIS — E785 Hyperlipidemia, unspecified: Secondary | ICD-10-CM | POA: Diagnosis not present

## 2017-09-23 DIAGNOSIS — D539 Nutritional anemia, unspecified: Secondary | ICD-10-CM | POA: Diagnosis not present

## 2017-09-23 NOTE — Patient Instructions (Addendum)
F/U in 5 months , call if you need me before    Fasting Labs first week I August please CBC, iron and ferritin, chem 7 and EGFR, calcium and PTH, lipid   If PTH is abnormal,  then I will be referring you to an endocrinologist, Dr Dorris Fetch is in [place]  Please reduce calcium to two tablets daily  Please take half protonix 40 mg tablet daily , if you start having reflux symptoms increase the dose again  Please try and make a commitment to enjoying physical activity for 30 minutes every day   I am SO HAPPY that your health is SO [facility] BETTER following this major life changing brave event

## 2017-09-25 ENCOUNTER — Encounter: Payer: Self-pay | Admitting: Family Medicine

## 2017-09-25 ENCOUNTER — Other Ambulatory Visit: Payer: Self-pay | Admitting: Family Medicine

## 2017-09-25 NOTE — Assessment & Plan Note (Signed)
Improved, but needs to commit to regular exercise Patient re-educated about  the importance of commitment to a  minimum of 150 minutes of exercise per week.  The importance of healthy food choices with portion control discussed. Encouraged to start a food diary, count calories and to consider  joining a support group. Sample diet sheets offered. Goals set by the patient for the next several months.   Weight /BMI 09/23/2017 07/06/2017 05/20/2017  WEIGHT 196 lb 216 lb 234 lb 12.8 oz  HEIGHT 5\' 2"  5\' 2"  5\' 2"   BMI 35.85 kg/m2 39.51 kg/m2 42.95 kg/m2

## 2017-09-25 NOTE — Assessment & Plan Note (Signed)
Hyperlipidemia:Low fat diet discussed and encouraged.   Lipid Panel  Lab Results  Component Value Date   CHOL 175 10/01/2016   HDL 70 10/01/2016   LDLCALC 80 10/01/2016   TRIG 126 10/01/2016   CHOLHDL 2.5 10/01/2016   Updated lab needed at/ before next visit.

## 2017-09-25 NOTE — Assessment & Plan Note (Signed)
Patient educated about the importance of limiting  Carbohydrate intake , the need to commit to daily physical activity for a minimum of 30 minutes , and to commit weight loss. The fact that changes in all these areas will reduce or eliminate all together the development of diabetes is stressed. Reduction in HBA1C with weight loss and change in food choice  Diabetic Labs Latest Ref Rng & Units 09/17/2017 05/05/2017 05/04/2017 01/21/2017 10/01/2016  HbA1c 4.8 - 5.6 % 5.9(H) - 6.2(H) 6.4(H) 6.5(H)  Chol 100 - 199 mg/dL - - - - 175  HDL >39 mg/dL - - - - 70  Calc LDL 0 - 99 mg/dL - - - - 80  Triglycerides 0 - 149 mg/dL - - - - 126  Creatinine 0.57 - 1.00 mg/dL 0.68 0.64 0.59 0.67 -   BP/Weight 09/23/2017 07/06/2017 05/20/2017 05/18/2017 05/06/2017 05/05/2017 54/65/6812  Systolic BP 751 - - - 700 - 174  Diastolic BP 78 - - - 77 - 84  Wt. (Lbs) 196 216 234.8 235 246.1 - 242  BMI 35.85 39.51 42.95 42.98 - 45.01 44.26   Foot/eye exam completion dates 01/27/2017  Foot Form Completion Done

## 2017-09-25 NOTE — Assessment & Plan Note (Signed)
Controlled, no change in medication Will re evaluate at next vis with a view to dec rasing medication dose

## 2017-09-25 NOTE — Progress Notes (Signed)
Caitlin Sampson     MRN: [id number]      DOB: [birth year]   HPI Caitlin Sampson is here for follow up and re-evaluation of chronic medical conditions, medication management and review of any available recent lab and radiology data.  Preventive health is updated, specifically  Cancer screening and Immunization.   She has done extremely well following gastric bypass, no complications , losing a total of 60 pounds in the past 4 months, and feeling 100 % benter and healthier There are no specific complaints   ROS Denies recent fever or chills. Denies sinus pressure, nasal congestion, ear pain or sore throat. Denies chest congestion, productive cough or wheezing. Denies chest pains, palpitations and leg swelling Denies abdominal pain, nausea, vomiting,diarrhea or constipation.   Denies dysuria, frequency, hesitancy or incontinence. Denies joint pain, swelling and limitation in mobility. Denies headaches, seizures, numbness, or tingling. Denies depression, anxiety or insomnia. Denies skin break down or rash.   PE  BP 118/78   Pulse 66   Resp 16   Ht 5\' 2"  (1.575 m)   Wt 196 lb (88.9 kg)   SpO2 98%   BMI 35.85 kg/m   Patient alert and oriented and in no cardiopulmonary distress.  HEENT: No facial asymmetry, EOMI,   oropharynx pink and moist.  Neck supple no JVD, no mass.  Chest: Clear to auscultation bilaterally.  CVS: S1, S2 no murmurs, no S3.Regular rate.  ABD: Soft non tender.   Ext: No edema  MS: Adequate ROM spine, shoulders, hips and knees.  Skin: Intact, no ulcerations or rash noted.  Psych: Good eye contact, normal affect. Memory intact not anxious or depressed appearing.  CNS: CN 2-12 intact, power,  normal throughout.no focal deficits noted.   Assessment & Plan  Essential hypertension Controlled, no change in medication DASH diet and commitment to daily physical activity for a minimum of 30 minutes discussed and encouraged, as a part of hypertension  management. The importance of attaining a healthy weight is also discussed.  BP/Weight 09/23/2017 07/06/2017 05/20/2017 05/18/2017 05/06/2017 05/05/2017 56/38/7564  Systolic BP 332 - - - 951 - 884  Diastolic BP 78 - - - 77 - 84  Wt. (Lbs) 196 216 234.8 235 246.1 - 242  BMI 35.85 39.51 42.95 42.98 - 45.01 44.26       Hyperlipidemia Hyperlipidemia:Low fat diet discussed and encouraged.   Lipid Panel  Lab Results  Component Value Date   CHOL 175 10/01/2016   HDL 70 10/01/2016   LDLCALC 80 10/01/2016   TRIG 126 10/01/2016   CHOLHDL 2.5 10/01/2016   Updated lab needed at/ before next visit.    MORBID OBESITY Improved, but needs to commit to regular exercise Patient re-educated about  the importance of commitment to a  minimum of 150 minutes of exercise per week.  The importance of healthy food choices with portion control discussed. Encouraged to start a food diary, count calories and to consider  joining a support group. Sample diet sheets offered. Goals set by the patient for the next several months.   Weight /BMI 09/23/2017 07/06/2017 05/20/2017  WEIGHT 196 lb 216 lb 234 lb 12.8 oz  HEIGHT 5\' 2"  5\' 2"  5\' 2"   BMI 35.85 kg/m2 39.51 kg/m2 42.95 kg/m2      GAD (generalized anxiety disorder) Controlled, no change in medication Will re evaluate at next vis with a view to dec rasing medication dose  Diabetes mellitus type 2 in obese [facility]) Patient educated about the importance of  limiting  Carbohydrate intake , the need to commit to daily physical activity for a minimum of 30 minutes , and to commit weight loss. The fact that changes in all these areas will reduce or eliminate all together the development of diabetes is stressed. Reduction in HBA1C with weight loss and change in food choice  Diabetic Labs Latest Ref Rng & Units 09/17/2017 05/05/2017 05/04/2017 01/21/2017 10/01/2016  HbA1c 4.8 - 5.6 % 5.9(H) - 6.2(H) 6.4(H) 6.5(H)  Chol 100 - 199 mg/dL - - - - 175  HDL >39 mg/dL - - -  - 70  Calc LDL 0 - 99 mg/dL - - - - 80  Triglycerides 0 - 149 mg/dL - - - - 126  Creatinine 0.57 - 1.00 mg/dL 0.68 0.64 0.59 0.67 -   BP/Weight 09/23/2017 07/06/2017 05/20/2017 05/18/2017 05/06/2017 05/05/2017 21/22/4825  Systolic BP 003 - - - 704 - 888  Diastolic BP 78 - - - 77 - 84  Wt. (Lbs) 196 216 234.8 235 246.1 - 242  BMI 35.85 39.51 42.95 42.98 - 45.01 44.26   Foot/eye exam completion dates 01/27/2017  Foot Form Completion Done      Hypercalcemia Noted to have elevated calcium levels and elevated PTH , will rept in 3 months, if still present, will refer to endo  GERD Reduce dose of PPI if able

## 2017-09-25 NOTE — Assessment & Plan Note (Signed)
Reduce dose of PPI if able

## 2017-09-25 NOTE — Assessment & Plan Note (Signed)
Controlled, no change in medication DASH diet and commitment to daily physical activity for a minimum of 30 minutes discussed and encouraged, as a part of hypertension management. The importance of attaining a healthy weight is also discussed.  BP/Weight 09/23/2017 07/06/2017 05/20/2017 05/18/2017 05/06/2017 05/05/2017 47/65/4650  Systolic BP 354 - - - 656 - 812  Diastolic BP 78 - - - 77 - 84  Wt. (Lbs) 196 216 234.8 235 246.1 - 242  BMI 35.85 39.51 42.95 42.98 - 45.01 44.26

## 2017-09-25 NOTE — Assessment & Plan Note (Signed)
Noted to have elevated calcium levels and elevated PTH , will rept in 3 months, if still present, will refer to endo

## 2017-10-06 LAB — SPECIMEN STATUS REPORT

## 2017-10-06 LAB — LIPID PANEL W/O CHOL/HDL RATIO
Cholesterol, Total: 169 mg/dL (ref 100–199)
HDL: 62 mg/dL (ref >39)
LDL Calculated: 87 mg/dL (ref 0–99)
Triglycerides: 101 mg/dL (ref 0–149)
VLDL Cholesterol Cal: 20 mg/dL (ref 5–40)

## 2017-11-03 ENCOUNTER — Encounter: Payer: 59 | Attending: Surgery | Admitting: Registered"

## 2017-11-03 ENCOUNTER — Encounter: Payer: Self-pay | Admitting: Registered"

## 2017-11-03 DIAGNOSIS — E1169 Type 2 diabetes mellitus with other specified complication: Secondary | ICD-10-CM | POA: Insufficient documentation

## 2017-11-03 DIAGNOSIS — Z6834 Body mass index (BMI) 34.0-34.9, adult: Secondary | ICD-10-CM | POA: Diagnosis not present

## 2017-11-03 DIAGNOSIS — E669 Obesity, unspecified: Secondary | ICD-10-CM | POA: Insufficient documentation

## 2017-11-03 DIAGNOSIS — Z713 Dietary counseling and surveillance: Secondary | ICD-10-CM | POA: Diagnosis present

## 2017-11-03 NOTE — Patient Instructions (Signed)
Goals:  Follow Phase V: High Protein + All Vegetables  Eat 3-6 small meals/snacks, every 3-5 hrs  Increase lean protein foods to meet 60g goal  Increase fluid intake to 64oz +  Avoid drinking 15 minutes before, during and 30 minutes after eating  Aim for >30 min of physical activity daily  

## 2017-11-03 NOTE — Progress Notes (Signed)
Follow-up visit: 6 Months Post-Operative Sleeve gastrectomy Surgery  Medical Nutrition Therapy:  Appt start time: 4:45 end time:  5:15.  Primary concerns today: Post-operative Bariatric Surgery Nutrition Management.  Non scale victories: increased energy, sleeps better, can tie shoes comfortably, can cross legs, clothes are fitting better, feel better, compliments on appearance, reducing medications  Surgery date: 05/05/2017 Surgery type: Sleeve Start weight at [facility]: 252 Weight today: 187.8 Weight change: 28.2 lbs from 216.0 (07/06/2017) Total weight lost: 64.2 lbs Weight loss goal: reduce medications, get healthier   TANITA  BODY COMP RESULTS  05/20/2016 07/06/2017 11/03/2017   BMI (kg/m^2) 42.9 39.5 34.3   Fat Mass (lbs) 121.2 106.0 76.8   Fat Free Mass (lbs) 113.6 110.0 111.0   Total Body Water (lbs) 82.8 79.4 79.0    Pt is talkative. Pt states she had a chicken quesadilla from [employer] once; tolerated well. Pt states she likes chicken. Pt states things do not bother her as far as seeing other people eat. Pt states PCP told her to stop taking calcium due to elevated levels.    Preferred Learning Style:   No preference indicated   Learning Readiness:   Ready  Change in progress  24-hr recall: B (AM): 2 eggs with cheese (18g) or protein shake (30g) Snk (AM): string cheese (7g)  L (PM): 3 oz grilled chicken (21g)  Snk (PM): dark chocolate, walnuts (7g) D (PM): pork loin (21g), salad   Snk (PM): no sugar-added fudgesicle  Fluid intake: water, powerade zero, premier protein clear drink, lactose-free milk, unsweetened tea, [employer]; 64+ ounces Estimated total protein intake: 60+ grams  Medications: See list; reduced blood pressure medications, no metformin Supplementation: Celebrate   CBG monitoring: no Average CBG per patient: N/A Last patient reported A1c: 5.9  Using straws: no Drinking while eating: no Having you been chewing well:  yes Chewing/swallowing difficulties: no Changes in vision: no Changes to mood/headaches: no Hair loss/Changes to skin/Changes to nails: no, no, dry nails Any difficulty focusing or concentrating: no Sweating: no Dizziness/Lightheaded: no Palpitations: no  Carbonated beverages: no N/V/D/C/GAS: no, no, no, no, no Abdominal Pain: no Dumping syndrome: no Last Lap-Band fill: N/A  Recent physical activity:  Weight bearing exercises and stretching 15 min, 3 days/week  Progress Towards Goal(s):  In progress.  Handouts given during visit include:  Phase IV: High protein + All vegetables   Nutritional Diagnosis:  [place]-3.3 Overweight/obesity related to past poor dietary habits and physical inactivity as evidenced by patient w/ recent sleeve gastrectomy surgery following dietary guidelines for continued weight loss.     Intervention:  Nutrition education and counseling. Pt was educated and counseled on the next phase of bariatric surgery post-op diet.  Goals:  Follow Phase V: High Protein + All Vegetables  Eat 3-6 small meals/snacks, every 3-5 hrs  Increase lean protein foods to meet 60g goal  Increase fluid intake to 64oz +  Avoid drinking 15 minutes before, during and 30 minutes after eating  Aim for >30 min of physical activity daily  Teaching Method Utilized:  Visual Auditory Hands on  Barriers to learning/adherence to lifestyle change: none identified  Demonstrated degree of understanding via:  Teach Back   Monitoring/Evaluation:  Dietary intake, exercise, lap band fills, and body weight. Follow up in 3 months for 9 month post-op visit.

## 2018-02-03 ENCOUNTER — Ambulatory Visit: Payer: 59 | Admitting: Registered"

## 2018-02-04 ENCOUNTER — Encounter: Payer: Self-pay | Admitting: Registered"

## 2018-02-04 ENCOUNTER — Encounter: Payer: 59 | Attending: Surgery | Admitting: Registered"

## 2018-02-04 DIAGNOSIS — Z713 Dietary counseling and surveillance: Secondary | ICD-10-CM | POA: Diagnosis not present

## 2018-02-04 DIAGNOSIS — Z9884 Bariatric surgery status: Secondary | ICD-10-CM | POA: Insufficient documentation

## 2018-02-04 DIAGNOSIS — Z6832 Body mass index (BMI) 32.0-32.9, adult: Secondary | ICD-10-CM | POA: Insufficient documentation

## 2018-02-04 DIAGNOSIS — E669 Obesity, unspecified: Secondary | ICD-10-CM

## 2018-02-04 DIAGNOSIS — E119 Type 2 diabetes mellitus without complications: Secondary | ICD-10-CM

## 2018-02-04 NOTE — Patient Instructions (Signed)
Goals:  Follow Phase VI: High Protein + All Vegetables + Fruit  Eat 3-6 small meals/snacks, every 3-5 hrs  Increase lean protein foods to meet 60g goal  Increase fluid intake to 64oz +  Avoid drinking 15 minutes before, during and 30 minutes after eating  Aim for >30 min of physical activity daily

## 2018-02-04 NOTE — Progress Notes (Signed)
Follow-up visit: 9 Months Post-Operative Sleeve gastrectomy Surgery  Medical Nutrition Therapy:  Appt start time: 5:00 end time:  5:30.  Primary concerns today: Post-operative Bariatric Surgery Nutrition Management.  Non scale victories: increased energy, sleeps better, can tie shoes comfortably, can cross legs, clothes are fitting better, feel better, compliments on appearance, reducing medications, overall feels better  Surgery date: 05/05/2017 Surgery type: Sleeve Start weight at [facility]: 252 Weight today: 179.8 Weight change: 8 lbs from 187.8 (11/03/2017) Total weight lost: 72.2 lbs Weight loss goal: reduce medications, get healthier   TANITA  BODY COMP RESULTS  05/20/2016 07/06/2017 11/03/2017 02/04/2018   BMI (kg/m^2) 42.9 39.5 34.3 32.9   Fat Mass (lbs) 121.2 106.0 76.8 70.6   Fat Free Mass (lbs) 113.6 110.0 111.0 109.2   Total Body Water (lbs) 82.8 79.4 79.0 77.2    Pt states she has only eaten a small amount of starchy vegetables. Pt states she is able to tolerate a lot of things.   Pt states she had a chicken quesadilla from [employer] once; tolerated well. Pt states she likes chicken. Pt states things do not bother her as far as seeing other people eat. Pt states PCP told her to stop taking calcium due to elevated levels.    Preferred Learning Style:   No preference indicated   Learning Readiness:   Ready  Change in progress  24-hr recall: B (AM): 2 eggs with cheese (19g) Snk (AM): string cheese (7g)  L (PM): 3 oz grilled chicken (21g) + green beans + navy beans Snk (PM): dark chocolate, walnuts (7g) D (PM): pork loin (21g), salad + a few boiled potatoes Snk (PM): no sugar-added fudgesicle  Fluid intake: water, powerade zero, premier protein clear drink, lactose-free milk, unsweetened tea, Nature's Twist; 64+ ounces Estimated total protein intake: 60+ grams  Medications: See list; reduced blood pressure medications, no metformin, no  cholesterol Supplementation: Celebrate   CBG monitoring: no Average CBG per patient: N/A Last patient reported A1c: 5.9  Using straws: no Drinking while eating: no Having you been chewing well: yes Chewing/swallowing difficulties: no Changes in vision: no Changes to mood/headaches: no Hair loss/Changes to skin/Changes to nails: no, no, dry nails Any difficulty focusing or concentrating: no Sweating: no Dizziness/Lightheaded: no Palpitations: no  Carbonated beverages: no N/V/D/C/GAS: no, no, no, no, no Abdominal Pain: no Dumping syndrome: no Last Lap-Band fill: N/A  Recent physical activity:  Using gazelle for cardio Weight bearing exercises and stretching 15 min, 3 days/week  Progress Towards Goal(s):  In progress.  Handouts given during visit include:  Phase IV: High protein + All vegetables   Nutritional Diagnosis:  [place]-3.3 Overweight/obesity related to past poor dietary habits and physical inactivity as evidenced by patient w/ recent sleeve gastrectomy surgery following dietary guidelines for continued weight loss.     Intervention:  Nutrition education and counseling. Pt was educated and counseled on the next phase of bariatric surgery post-op diet.  Goals:  Follow Phase VI: High Protein + All Vegetables + Fruit  Eat 3-6 small meals/snacks, every 3-5 hrs  Increase lean protein foods to meet 60g goal  Increase fluid intake to 64oz +  Avoid drinking 15 minutes before, during and 30 minutes after eating  Aim for >30 min of physical activity daily  Teaching Method Utilized:  Visual Auditory Hands on  Barriers to learning/adherence to lifestyle change: none identified  Demonstrated degree of understanding via:  Teach Back   Monitoring/Evaluation:  Dietary intake, exercise, lap band fills, and  body weight. Follow up in 3 months for 12 month post-op visit.

## 2018-02-22 LAB — PTH, INTACT AND CALCIUM: PTH: 75 pg/mL — ABNORMAL HIGH (ref 15–65)

## 2018-02-22 LAB — CBC
HEMOGLOBIN: 13.5 g/dL (ref 11.1–15.9)
Hematocrit: 39.5 % (ref 34.0–46.6)
MCH: 30.6 pg (ref 26.6–33.0)
MCHC: 34.2 g/dL (ref 31.5–35.7)
MCV: 90 fL (ref 79–97)
PLATELETS: 322 10*3/uL (ref 150–450)
RBC: 4.41 x10E6/uL (ref 3.77–5.28)
RDW: 13.3 % (ref 12.3–15.4)
WBC: 6.7 10*3/uL (ref 3.4–10.8)

## 2018-02-22 LAB — BMP8+EGFR
BUN / CREAT RATIO: 20 (ref 9–23)
BUN: 13 mg/dL (ref 6–24)
CO2: 27 mmol/L (ref 20–29)
CREATININE: 0.64 mg/dL (ref 0.57–1.00)
Calcium: 10.4 mg/dL — ABNORMAL HIGH (ref 8.7–10.2)
Chloride: 100 mmol/L (ref 96–106)
GFR calc Af Amer: 116 mL/min/{1.73_m2} (ref >59)
GFR, EST NON AFRICAN AMERICAN: 101 mL/min/{1.73_m2} (ref >59)
Glucose: 91 mg/dL (ref 65–99)
Potassium: 3.8 mmol/L (ref 3.5–5.2)
SODIUM: 141 mmol/L (ref 134–144)

## 2018-02-22 LAB — LIPID PANEL
CHOL/HDL RATIO: 2.9 ratio (ref 0.0–4.4)
Cholesterol, Total: 234 mg/dL — ABNORMAL HIGH (ref 100–199)
HDL: 82 mg/dL (ref >39)
LDL CALC: 129 mg/dL — AB (ref 0–99)
TRIGLYCERIDES: 113 mg/dL (ref 0–149)
VLDL Cholesterol Cal: 23 mg/dL (ref 5–40)

## 2018-02-22 LAB — IRON: IRON: 108 ug/dL (ref 27–159)

## 2018-02-22 LAB — FERRITIN: Ferritin: 46 ng/mL (ref 15–150)

## 2018-02-24 ENCOUNTER — Ambulatory Visit: Payer: 59 | Admitting: Family Medicine

## 2018-03-01 ENCOUNTER — Encounter: Payer: Self-pay | Admitting: Family Medicine

## 2018-03-01 ENCOUNTER — Ambulatory Visit (INDEPENDENT_AMBULATORY_CARE_PROVIDER_SITE_OTHER): Payer: 59 | Admitting: Family Medicine

## 2018-03-01 VITALS — BP 136/80 | HR 72 | Resp 12 | Ht 62.0 in | Wt 179.0 lb

## 2018-03-01 DIAGNOSIS — E8881 Metabolic syndrome: Secondary | ICD-10-CM

## 2018-03-01 DIAGNOSIS — Z23 Encounter for immunization: Secondary | ICD-10-CM

## 2018-03-01 DIAGNOSIS — E782 Mixed hyperlipidemia: Secondary | ICD-10-CM

## 2018-03-01 DIAGNOSIS — E669 Obesity, unspecified: Secondary | ICD-10-CM

## 2018-03-01 DIAGNOSIS — I10 Essential (primary) hypertension: Secondary | ICD-10-CM | POA: Diagnosis not present

## 2018-03-01 DIAGNOSIS — R7989 Other specified abnormal findings of blood chemistry: Secondary | ICD-10-CM

## 2018-03-01 DIAGNOSIS — E1169 Type 2 diabetes mellitus with other specified complication: Secondary | ICD-10-CM | POA: Diagnosis not present

## 2018-03-01 DIAGNOSIS — D3A026 Benign carcinoid tumor of the rectum: Secondary | ICD-10-CM

## 2018-03-01 DIAGNOSIS — L723 Sebaceous cyst: Secondary | ICD-10-CM

## 2018-03-01 NOTE — Progress Notes (Signed)
Caitlin Sampson     MRN: [id number]      DOB: 09-Jun-1960   HPI Caitlin Sampson is here for follow up and re-evaluation of chronic medical conditions, medication management and review of any available recent lab and radiology data.  Preventive health is updated, specifically  Cancer screening and Immunization.   Questions or concerns regarding consultations or procedures which the PT has had in the interim are  addressed. The PT denies any adverse reactions to current medications since the last visit. Has not been exercising as often as she should will increase this, and has concerns about abnormal calcium levels C/o cyst on right arm for years , increasing in size wants this removed ROS Denies recent fever or chills. Denies sinus pressure, nasal congestion, ear pain or sore throat. Denies chest congestion, productive cough or wheezing. Denies chest pains, palpitations and leg swelling Denies abdominal pain, nausea, vomiting,diarrhea or constipation.   Denies dysuria, frequency, hesitancy or incontinence. Denies joint pain, swelling and limitation in mobility. Denies headaches, seizures, numbness, or tingling. Denies depression, anxiety or insomnia.   PE  BP 136/80 (BP Location: Left Arm, Patient Position: Sitting, Cuff Size: Large)   Pulse 72   Resp 12   Ht 5\' 2"  (1.575 m)   Wt 179 lb 0.6 oz (81.2 kg)   SpO2 98% Comment: room air  BMI 32.75 kg/m   Patient alert and oriented and in no cardiopulmonary distress.  HEENT: No facial asymmetry, EOMI,   oropharynx pink and moist.  Neck supple no JVD, no mass.  Chest: Clear to auscultation bilaterally.  CVS: S1, S2 no murmurs, no S3.Regular rate.  ABD: Soft non tender.   Ext: No edema  MS: Adequate ROM spine, shoulders, hips and knees.  Skin: Intact, no ulcerations or rash noted.sebaceous cyst approx 4 in wide and 3 in deep on right arm  Psych: Good eye contact, normal affect. Memory intact not anxious or depressed  appearing.  CNS: CN 2-12 intact, power,  normal throughout.no focal deficits noted.   Assessment & Plan  Essential hypertension Controlled, though sub optimal,no change in medication DASH diet and commitment to daily physical activity for a minimum of 30 minutes discussed and encouraged, as a part of hypertension management. The importance of attaining a healthy weight is also discussed.  BP/Weight 03/01/2018 02/04/2018 11/03/2017 09/23/2017 07/06/2017 05/20/2017 67/04/4579  Systolic BP 998 - - 338 - - -  Diastolic BP 80 - - 78 - - -  Wt. (Lbs) 179.04 179.8 187.8 196 216 234.8 235  BMI 32.75 32.89 34.35 35.85 39.51 42.95 42.98       MORBID OBESITY Improved Patient re-educated about  the importance of commitment to a  minimum of 150 minutes of exercise per week.  The importance of healthy food choices with portion control discussed. Encouraged to start a food diary, count calories and to consider  joining a support group. Sample diet sheets offered. Goals set by the patient for the next several months.   Weight /BMI 03/01/2018 02/04/2018 11/03/2017  WEIGHT 179 lb 0.6 oz 179 lb 12.8 oz 187 lb 12.8 oz  HEIGHT 5\' 2"  5\' 2"  5\' 2"   BMI 32.75 kg/m2 32.89 kg/m2 34.35 kg/m2      Hyperlipidemia Hyperlipidemia:Low fat diet discussed and encouraged.   Lipid Panel  Lab Results  Component Value Date   CHOL 234 (H) 02/20/2018   HDL 82 02/20/2018   LDLCALC 129 (H) 02/20/2018   TRIG 113 02/20/2018   CHOLHDL 2.9 02/20/2018  Deteriorated, needs to reduce fat in diet esp cheese and eggs  Hypercalcemia Elevated calcium and PTH persisting with further increase in PTH, refer to endo  Sebaceous cyst  Enlarging and recurrent  sebaceous cyst of right arm, requests removal, surgery referral  Rectal carcinoid tumor Message to gastro requesting guidance as far as colon screening is concerned , will await response, pt aware, she has no GI symptoms

## 2018-03-01 NOTE — Assessment & Plan Note (Signed)
Enlarging and recurrent  sebaceous cyst of right arm, requests removal, surgery referral

## 2018-03-01 NOTE — Assessment & Plan Note (Addendum)
Controlled, though sub optimal,no change in medication DASH diet and commitment to daily physical activity for a minimum of 30 minutes discussed and encouraged, as a part of hypertension management. The importance of attaining a healthy weight is also discussed.  BP/Weight 03/01/2018 02/04/2018 11/03/2017 09/23/2017 07/06/2017 05/20/2017 70/78/6754  Systolic BP 492 - - 010 - - -  Diastolic BP 80 - - 78 - - -  Wt. (Lbs) 179.04 179.8 187.8 196 216 234.8 235  BMI 32.75 32.89 34.35 35.85 39.51 42.95 42.98

## 2018-03-01 NOTE — Assessment & Plan Note (Signed)
Elevated calcium and PTH persisting with further increase in PTH, refer to endo

## 2018-03-01 NOTE — Assessment & Plan Note (Signed)
Improved Patient re-educated about  the importance of commitment to a  minimum of 150 minutes of exercise per week.  The importance of healthy food choices with portion control discussed. Encouraged to start a food diary, count calories and to consider  joining a support group. Sample diet sheets offered. Goals set by the patient for the next several months.   Weight /BMI 03/01/2018 02/04/2018 11/03/2017  WEIGHT 179 lb 0.6 oz 179 lb 12.8 oz 187 lb 12.8 oz  HEIGHT 5\' 2"  5\' 2"  5\' 2"   BMI 32.75 kg/m2 32.89 kg/m2 34.35 kg/m2

## 2018-03-01 NOTE — Patient Instructions (Addendum)
F/U in 5 months, with shingrix #1, call if you need me before   Flu vaccine today  Congrats on excellent weight loss  Stop potassium  Please   Please enjoy early morning,exercise, vital for good health!  I will send you a message when I hear from Dr Laural Golden re colonoscopy need  Timing  You are referred to surgeon, re cyst on right forearm for years, increasing in size ( approx 3 in)  You are referred to Endocrinology in [place] to evaluate you for high calcium and the parathyroids  Stop the cheese, and cut back on egg yolk, half the amt, also cut back on processed foods  Fasting lipid, cmp and eGFr, and HBa1C ad vit D level 1 week before follow up

## 2018-03-01 NOTE — Assessment & Plan Note (Signed)
Message to gastro requesting guidance as far as colon screening is concerned , will await response, pt aware, she has no GI symptoms

## 2018-03-01 NOTE — Assessment & Plan Note (Signed)
Hyperlipidemia:Low fat diet discussed and encouraged.   Lipid Panel  Lab Results  Component Value Date   CHOL 234 (H) 02/20/2018   HDL 82 02/20/2018   LDLCALC 129 (H) 02/20/2018   TRIG 113 02/20/2018   CHOLHDL 2.9 02/20/2018     Deteriorated, needs to reduce fat in diet esp cheese and eggs

## 2018-03-11 ENCOUNTER — Encounter: Payer: Self-pay | Admitting: General Surgery

## 2018-03-11 ENCOUNTER — Ambulatory Visit: Payer: 59 | Admitting: General Surgery

## 2018-03-11 VITALS — BP 155/93 | HR 69 | Temp 97.5°F | Resp 18 | Wt 183.6 lb

## 2018-03-11 DIAGNOSIS — L723 Sebaceous cyst: Secondary | ICD-10-CM | POA: Diagnosis not present

## 2018-03-11 NOTE — Patient Instructions (Signed)
Epidermal Cyst (Sebaceous Cyst) An epidermal cyst is sometimes called an epidermal inclusion cyst or an infundibular cyst. It is a sac made of skin tissue. The sac contains a substance called keratin. Keratin is a protein that is normally secreted through the hair follicles. When keratin becomes trapped in the top layer of skin (epidermis), it can form an epidermal cyst. Epidermal cysts are usually found on the face, neck, trunk, and genitals. These cysts are usually harmless (benign), and they may not cause symptoms unless they become infected. It is important not to pop epidermal cysts yourself. What are the causes? This condition may be caused by:  A blocked hair follicle.  A hair that curls and re-enters the skin instead of growing straight out of the skin (ingrown hair).  A blocked pore.  Irritated skin.  An injury to the skin.  Certain conditions that are passed along from parent to child (inherited).  Human papillomavirus (HPV).  What increases the risk? The following factors may make you more likely to develop an epidermal cyst:  Having acne.  Being overweight.  Wearing tight clothing.  What are the signs or symptoms? The only symptom of this condition may be a small, painless lump underneath the skin. When an epidermal cyst becomes infected, symptoms may include:  Redness.  Inflammation.  Tenderness.  Warmth.  Fever.  Keratin draining from the cyst. Keratin may look like a grayish-white, bad-smelling substance.  Pus draining from the cyst.  How is this diagnosed? This condition is diagnosed with a physical exam. In some cases, you may have a sample of tissue (biopsy) taken from your cyst to be examined under a microscope or tested for bacteria. You may be referred to a health care provider who specializes in skin care (dermatologist). How is this treated? In many cases, epidermal cysts go away on their own without treatment. If a cyst becomes infected,  treatment may include:  Opening and draining the cyst. After draining, minor surgery to remove the rest of the cyst may be done.  Antibiotic medicine to help prevent infection.  Injections of medicines (steroids) that help to reduce inflammation.  Surgery to remove the cyst. Surgery may be done if: ? The cyst becomes large. ? The cyst bothers you. ? There is a chance that the cyst could turn into cancer.  Follow these instructions at home:  Take over-the-counter and prescription medicines only as told by your health care provider.  If you were prescribed an antibiotic, use it as told by your health care provider. Do not stop using the antibiotic even if you start to feel better.  Keep the area around your cyst clean and dry.  Wear loose, dry clothing.  Do not try to pop your cyst.  Avoid touching your cyst.  Check your cyst every day for signs of infection.  Keep all follow-up visits as told by your health care provider. This is important. How is this prevented?  Wear clean, dry, clothing.  Avoid wearing tight clothing.  Keep your skin clean and dry. Shower or take baths every day.  Wash your body with a benzoyl peroxide wash when you shower or bathe. Contact a health care provider if:  Your cyst develops symptoms of infection.  Your condition is not improving or is getting worse.  You develop a cyst that looks different from other cysts you have had.  You have a fever. Get help right away if:  Redness spreads from the cyst into the surrounding area. This  information is not intended to replace advice given to you by your health care provider. Make sure you discuss any questions you have with your health care provider. Document Released: 04/05/2004 Document Revised: 01/02/2016 Document Reviewed: 03/07/2015 Elsevier Interactive Patient Education  [employer].

## 2018-03-11 NOTE — Progress Notes (Signed)
[facility] History and Physical  Reason for Referral:Cyst Referring Physician:  Dr. Dustin Folks is a 57 y.o. female.  HPI: .  The patient reports that she has had multiple cysts on her arms and one on her back for some time. The area on her right arm has been getting larger and has had foul smelling drainage at times but has not been red or infected.  She has a history of 75 lbs of weight loss after a laparoscopic sleeve gastrectomy and is starting to see those lesions more now that she has lost weight. She is interested in getting these areas removed. She has had them for years.  She otherwise is doing well and has no complaints. She is off medication for diabetes since her weight loss.  She is worried that the smaller cysts on the left arm and back will get as large as the one on the right arm.     Past Medical History:  Diagnosis Date  . Anxiety   . Complication of anesthesia    felt strangled when woke up  . Diabetes mellitus without complication ([place])    type 2 diet controlled  . Diverticulosis of colon 2012  . GERD (gastroesophageal reflux disease)   . History of hiatal hernia   . History of kidney stones   . Hyperlipidemia   . Hypertension   . IDA (iron deficiency anemia)    hx of iron def.  . Poison oak dermatitis 2012    Past Surgical History:  Procedure Laterality Date  . ABDOMINAL HYSTERECTOMY    . CARDIAC CATHETERIZATION  09/05/03   EF 57%. NORMAL CORONARY ARTERIES. NO EVIDENCE OF RENAL ARTERY STENOSIS.  Marland Kitchen CHOLECYSTECTOMY    . ESOPHAGOGASTRODUODENOSCOPY N/A 10/17/2015   Procedure: ESOPHAGOGASTRODUODENOSCOPY (EGD);  Surgeon: Rogene Houston, MD;  Location: [facility] END[facility] SUITE;  Service: Endoscopy;  Laterality: N/A;  3:00  . LAPAROSCOPIC GASTRIC SLEEVE RESECTION N/A 05/05/2017   Procedure: LAPAROSCOPIC GASTRIC SLEEVE RESECTION, UPPER ENDO;  Surgeon: Johnathan Hausen, MD;  Location: [facility] ORS;  Service: General;  Laterality: N/A;  . MYOCARDIAL  PERFUSION STUDY  03/30/09   NORMAL. EF 76%.  . sleeve gastrectomy     05-05-18  . TRANSTHORACIC ECHOCARDIOGRAM  10/10/08   NORMAL. EF => 55%.    Family History  Problem Relation Age of Onset  . Heart disease Mother        a fib  . Kidney disease Mother   . Heart disease Father        MI  . Hyperlipidemia Father   . Hypertension Father   . Kidney disease Father   . Kidney disease Brother   . Diabetes Maternal Grandmother   . Stroke Maternal Grandmother     Social History   Tobacco Use  . Smoking status: Former Research scientist (life sciences)  . Smokeless tobacco: Never Used  . Tobacco comment: quit smoking for over 29 yrs.   Substance Use Topics  . Alcohol use: No    Alcohol/week: 0.0 standard drinks  . Drug use: No    Medications: I have reviewed the patient's current medications. Allergies as of 03/11/2018      Reactions   Ivp Dye [iodinated Diagnostic Agents] Itching      Medication List        Accurate as of 03/11/18  1:48 PM. Always use your most recent med list.          aspirin 81 MG tablet Take 81 mg by  mouth at bedtime.   BARIATRIC MULTIVITAMINS/IRON PO Take 1 tablet by mouth daily.   FLUoxetine 40 MG capsule Commonly known as:  PROZAC Take 1 capsule (40 mg total) by mouth daily.   losartan-hydrochlorothiazide 100-12.5 MG tablet Commonly known as:  HYZAAR Half tablet once daily efective 05/18/2017   pantoprazole 40 MG tablet Commonly known as:  PROTONIX Take 20 mg by mouth daily.        ROS:  A comprehensive review of systems was negative except for: Neurological: positive for dizziness cyst on arms and back  Blood pressure (!) 155/93, pulse 69, temperature (!) 97.5 F (36.4 C), temperature source Temporal, resp. rate 18, weight 183 lb 9.6 oz (83.3 kg). Physical Exam  Constitutional: She is oriented to person, place, and time. She appears well-developed and well-nourished.  HENT:  Head: Normocephalic and atraumatic.  Eyes: Pupils are equal, round, and  reactive to light. EOM are normal.  Neck: Normal range of motion.  Cardiovascular: Normal rate and regular rhythm.  Pulmonary/Chest: Effort normal and breath sounds normal.  Abdominal: Soft. She exhibits no distension. There is no tenderness.  Musculoskeletal: She exhibits no edema.  Neurological: She is alert and oriented to person, place, and time.  Skin: Skin is warm and dry.  Right upper arm with 2cm area superficial and mobile consistent with cyst, pit area, no drainage; left upper arm, small <1 cm area with pit centrally consistent with cyst and left upper back < 1cm hard area with central pit   Psychiatric: She has a normal mood and affect. Her behavior is normal. Judgment and thought content normal.  Vitals reviewed.   Results: None   Assessment & Plan:  Caitlin Sampson is a 57 y.o. female with multiple cysts on her arms and back. She has one on the right arm that is growing and is causing her some discomfort and worry due to it increasing in size. She says that it has drained "white cottage cheese" material in the past but has not been infected. She also has smaller similar lesions on the left upper arm and left upper back. She wants to get them all removed.  - Excision of cyst with local, patient was expecting to get the cysts removed today, but we do not do procedures in the clinic. We will get these removed in the minor procedure room as soon as possible.    All questions were answered to the satisfaction of the patient.  The risk and benefits of excision of the cyst (measuring 3cm total) were discussed including but not limited to bleeding, infection, risk of recurrence if not able to remove entire capsule.  After careful consideration, [name] has decided to proceed.    Virl Cagey 03/11/2018, 1:48 PM

## 2018-03-16 ENCOUNTER — Encounter (INDEPENDENT_AMBULATORY_CARE_PROVIDER_SITE_OTHER): Payer: Self-pay | Admitting: *Deleted

## 2018-03-24 ENCOUNTER — Encounter (HOSPITAL_COMMUNITY): Admission: RE | Payer: Self-pay | Source: Ambulatory Visit

## 2018-03-24 ENCOUNTER — Ambulatory Visit (HOSPITAL_COMMUNITY): Admission: RE | Admit: 2018-03-24 | Payer: 59 | Source: Ambulatory Visit | Admitting: General Surgery

## 2018-03-24 SURGERY — CYST REMOVAL
Anesthesia: LOCAL

## 2018-04-20 ENCOUNTER — Encounter: Payer: Self-pay | Admitting: "Endocrinology

## 2018-04-20 ENCOUNTER — Ambulatory Visit (INDEPENDENT_AMBULATORY_CARE_PROVIDER_SITE_OTHER): Payer: 59 | Admitting: "Endocrinology

## 2018-04-20 DIAGNOSIS — E210 Primary hyperparathyroidism: Secondary | ICD-10-CM

## 2018-04-20 NOTE — Progress Notes (Signed)
Endocrinology consult Note       04/20/2018, 10:35 AM  Caitlin Sampson is a 58 y.o.-year-old female, referred by her PCP, Dr. Moshe Cipro, for evaluation for hypercalcemia/hyperparathyroidism.   Past Medical History:  Diagnosis Date  . Anxiety   . Complication of anesthesia    felt strangled when woke up  . Diabetes mellitus without complication ([place])    type 2 diet controlled  . Diverticulosis of colon 2012  . GERD (gastroesophageal reflux disease)   . History of hiatal hernia   . History of kidney stones   . Hyperlipidemia   . Hypertension   . IDA (iron deficiency anemia)    hx of iron def.  . Poison oak dermatitis 2012    Past Surgical History:  Procedure Laterality Date  . ABDOMINAL HYSTERECTOMY    . CARDIAC CATHETERIZATION  09/05/03   EF 57%. NORMAL CORONARY ARTERIES. NO EVIDENCE OF RENAL ARTERY STENOSIS.  Marland Kitchen CHOLECYSTECTOMY    . ESOPHAGOGASTRODUODENOSCOPY N/A 10/17/2015   Procedure: ESOPHAGOGASTRODUODENOSCOPY (EGD);  Surgeon: Rogene Houston, MD;  [facility];  Service: Endoscopy;  Laterality: N/A;  3:00  . LAPAROSCOPIC GASTRIC SLEEVE RESECTION N/A 05/05/2017   Procedure: LAPAROSCOPIC GASTRIC SLEEVE RESECTION, UPPER ENDO;  Surgeon: Johnathan Hausen, MD;  Location: [facility];  Service: General;  Laterality: N/A;  . MYOCARDIAL PERFUSION STUDY  03/30/09   NORMAL. EF 76%.  . sleeve gastrectomy     05-05-18  . TRANSTHORACIC ECHOCARDIOGRAM  10/10/08   NORMAL. EF => 55%.    Social History   Tobacco Use  . Smoking status: Former Research scientist (life sciences)  . Smokeless tobacco: Never Used  . Tobacco comment: quit smoking for over 29 yrs.   Substance Use Topics  . Alcohol use: No    Alcohol/week: 0.0 standard drinks  . Drug use: No    Outpatient Encounter Medications as of 04/20/2018  Medication Sig  . aspirin 81 MG tablet Take 81 mg by mouth at bedtime.   Marland Kitchen FLUoxetine (PROZAC) 40 MG capsule Take 1 capsule (40 mg total) by mouth  daily. (Patient taking differently: Take 40 mg by mouth at bedtime. )  . folic acid (FOLVITE) 865 MCG tablet Take 400 mcg by mouth at bedtime.   Marland Kitchen losartan-hydrochlorothiazide (HYZAAR) 100-12.5 MG tablet Half tablet once daily efective 05/18/2017 (Patient taking differently: Take 0.5 tablets by mouth daily. Half tablet once daily efective 05/18/2017)  . Multiple Vitamins-Minerals (BARIATRIC MULTIVITAMINS/IRON PO) Take 1 tablet by mouth daily.  . pantoprazole (PROTONIX) 40 MG tablet Take 20 mg by mouth at bedtime.    No facility-administered encounter medications on file as of 04/20/2018.     Allergies  Allergen Reactions  . Ivp Dye [Iodinated Diagnostic Agents] Itching     HPI  Caitlin Sampson was diagnosed with hypercalcemia in May 2018. I reviewed patient's pertinent labs:  Lab Results  Component Value Date   PTH 75 (H) 02/20/2018   PTH Comment 02/20/2018   PTH 69 (H) 05/06/2017   PTH Comment 05/06/2017   CALCIUM 10.4 (H) 02/20/2018   CALCIUM 10.5 (H) 09/17/2017   CALCIUM 10.3 (H) 05/06/2017   CALCIUM 10.5 (H) 05/04/2017   CALCIUM 10.7 (  H) 01/21/2017   CALCIUM 10.6 (H) 09/22/2016   CALCIUM 10.0 04/14/2016   CALCIUM 9.9 08/09/2015   CALCIUM 10.1 07/06/2014   CALCIUM 9.8 02/21/2014   Does not have recent bone density to review.  He has history of nephrolithiasis in the remote past.  No prior history of fragility fractures or falls.   No history of CKD. Last BUN/Cr: Lab Results  Component Value Date   BUN 13 02/20/2018   CREATININE 0.64 02/20/2018    she is not on HCTZ or other thiazide therapy.  No history of  vitamin D deficiency. Last vitamin D level was 43.4 on Sep 17, 2017.  she is not on calcium supplements,  she eats dairy and green, leafy, vegetables on average amounts.  she does not have a family history of hypercalcemia, pituitary tumors, thyroid cancer, or osteoporosis.   I reviewed her chart and she also has a history of type 2 diabetes which is controlled  after her recent sleeve gastrectomy.  She is currently taking no medications for diabetes.   ROS:  Constitutional: + Lost 70 pounds since her surgery, no fatigue, no subjective hyperthermia, no subjective hypothermia Eyes: no blurry vision, no xerophthalmia ENT: no sore throat, no nodules palpated in throat, no dysphagia/odynophagia, no hoarseness Cardiovascular: no Chest Pain, no Shortness of Breath, no palpitations, no leg swelling Respiratory: no cough, no SOB Gastrointestinal: no Nausea/Vomiting/Diarhhea Musculoskeletal: no muscle/joint aches Skin: no rashes Neurological: no tremors, no numbness, no tingling, no dizziness Psychiatric: no depression, no anxiety  PE: BP 135/86   Pulse 66   Ht 5\' 2"  (1.575 m)   Wt 183 lb (83 kg)   BMI 33.47 kg/m  Wt Readings from Last 3 Encounters:  04/20/18 183 lb (83 kg)  03/11/18 183 lb 9.6 oz (83.3 kg)  03/01/18 179 lb 0.6 oz (81.2 kg)   Constitutional:  + Obese for height, not in acute distress, normal state of mind Eyes: PERRLA, EOMI, no exophthalmos ENT: moist mucous membranes, no thyromegaly, no cervical lymphadenopathy Cardiovascular: normal precordial activity, Regular Rate and Rhythm, no Murmur/Rubs/Gallops Respiratory:  adequate breathing efforts, no gross chest deformity, Clear to auscultation bilaterally Gastrointestinal: abdomen soft, Non -tender, No distension, Bowel Sounds present Musculoskeletal: no gross deformities, strength intact in all four extremities Skin: moist, warm, no rashes Neurological: no tremor with outstretched hands, Deep tendon reflexes normal in all four extremities.   CMP ( most recent) CMP     Component Value Date/Time   NA 141 02/20/2018 0831   K 3.8 02/20/2018 0831   CL 100 02/20/2018 0831   CO2 27 02/20/2018 0831   GLUCOSE 91 02/20/2018 0831   GLUCOSE 112 (H) 05/04/2017 0936   BUN 13 02/20/2018 0831   CREATININE 0.64 02/20/2018 0831   CREATININE 0.67 04/14/2016 0921   CALCIUM 10.4 (H)  02/20/2018 0831   CALCIUM 10.3 (H) 05/06/2017 0830   PROT 6.4 09/17/2017 0826   ALBUMIN 4.3 09/17/2017 0826   AST 18 09/17/2017 0826   ALT 14 09/17/2017 0826   ALKPHOS 84 09/17/2017 0826   BILITOT 0.4 09/17/2017 0826   GFRNONAA 101 02/20/2018 0831   GFRNONAA >89 04/14/2016 0921   GFRAA 116 02/20/2018 0831   GFRAA >89 04/14/2016 0921     Diabetic Labs (most recent): Lab Results  Component Value Date   HGBA1C 5.9 (H) 09/17/2017   HGBA1C 6.2 (H) 05/04/2017   HGBA1C 6.4 (H) 01/21/2017     Lipid Panel ( most recent) Lipid Panel     Component  Value Date/Time   CHOL 234 (H) 02/20/2018 0831   CHOL 236 (H) 06/15/2013 0849   TRIG 113 02/20/2018 0831   TRIG 132 06/15/2013 0849   HDL 82 02/20/2018 0831   HDL 64 06/15/2013 0849   CHOLHDL 2.9 02/20/2018 0831   CHOLHDL 2.2 04/14/2016 0921   VLDL 26 04/14/2016 0921   LDLCALC 129 (H) 02/20/2018 0831   LDLCALC 146 (H) 06/15/2013 0849      Lab Results  Component Value Date   TSH 3.740 10/01/2016   TSH 2.15 08/09/2015   TSH 2.244 02/21/2014   TSH 2.871 10/28/2012   TSH 3.148 10/07/2011   TSH 2.084 05/03/2010      Assessment: 1. Hypercalcemia / Hyperparathyroidism  Plan: Patient has had several instances of elevated calcium, with the highest level being at 10.7 mg/dL.  Recent labs show calcium of 10.4 elevated with a corresponding PTH of 75.    - Patient does not have vitamin D deficiency with the last level being 43.4.   -She has a remote past history of nephrolithiasis, may not be related to her recent hyperparathyroidism/hypercalcemia.   -No history of osteoporosis,fragility fractures.  Did not have recent bone density.  No abdominal pain, no major mood disorders, no bone pain.  - I discussed with the patient about the physiology of calcium and parathyroid hormone, and possible  effects of  increased PTH/ Calcium , including kidney stones, cardiac dysrhythmias, osteoporosis, abdominal pain, etc.   - The work up so far  is not sufficient to reach a conclusion for definitive therapy.  she  needs more studies to confirm and classify the parathyroid dysfunction she may have. I will proceed to obtain  obtain 24-hour urine calcium/creatinine to rule out the rare but important cause of mild elevation in calcium and PTH- FHH ( Familial Hypocalciuric Hypercalcemia), which may not require any active intervention.  - I will request for her  DEXA scan to include the distal  33% of  radius for evaluation of cortical bone, which is predominantly affected by hyperparathyroidism.   If she is confirmed to have primary hyperparathyroidism, she is a surgical candidate which is briefly discussed with her.  - Time spent with the patient: 30 minutes, of which >50% was spent in obtaining information about her symptoms, reviewing her previous labs, evaluations, and treatments, counseling her about her hypercalcemia/hyperparathyroidism, and developing a plan to confirm the diagnosis and long term treatment as necessary.  Rubye Beach participated in the discussions, expressed understanding, and voiced agreement with the above plans.  All questions were answered to her satisfaction. she is encouraged to contact clinic should she have any questions or concerns prior to her return visit.  - Return in about 3 weeks (around 05/11/2018) for 24 Hours Urine Calcium and Creatinine, Follow up with Bone Density.   Caitlin Lloyd, MD [facility] [address] [place], [place] [address] Phone: [phone]  Fax: [phone]    This note was partially dictated with voice recognition software. Similar sounding words can be transcribed inadequately or may not  be corrected upon review.  04/20/2018, 10:35 AM

## 2018-04-27 ENCOUNTER — Encounter: Payer: 59 | Attending: Surgery | Admitting: Skilled Nursing Facility1

## 2018-04-27 DIAGNOSIS — E669 Obesity, unspecified: Secondary | ICD-10-CM

## 2018-04-27 DIAGNOSIS — Z6832 Body mass index (BMI) 32.0-32.9, adult: Secondary | ICD-10-CM | POA: Insufficient documentation

## 2018-04-27 DIAGNOSIS — Z713 Dietary counseling and surveillance: Secondary | ICD-10-CM | POA: Diagnosis present

## 2018-04-27 DIAGNOSIS — Z9884 Bariatric surgery status: Secondary | ICD-10-CM | POA: Diagnosis not present

## 2018-04-27 LAB — CALCIUM, URINE, 24 HOUR
Calcium, 24H Urine: 210.6 mg/24 hr (ref 100.0–300.0)
Calcium, Urine: 23.4 mg/dL

## 2018-04-27 LAB — CREATININE, URINE, 24 HOUR
Creatinine, 24H Ur: 1051 mg/24 hr (ref 800–1800)
Creatinine, Urine: 116.8 mg/dL

## 2018-04-29 ENCOUNTER — Encounter: Payer: Self-pay | Admitting: Skilled Nursing Facility1

## 2018-04-29 ENCOUNTER — Ambulatory Visit: Payer: Self-pay | Admitting: "Endocrinology

## 2018-04-29 NOTE — Progress Notes (Signed)
Follow-up visit:  Post-Operative  Surgery  Medical Nutrition Therapy:  Appt start time: 6:00pm end time:  7:00pm  Primary concerns today: Post-operative Bariatric Surgery Nutrition Management 6 Month Post-Op Class  This was actually pts 1 year from surgery.   Surgery date: 05/05/2017 Surgery type: Sleeve Start weight at [facility]: 252 Weight today: 183.4   TANITA  BODY COMP RESULTS  05/20/2016 07/06/2017 11/03/2017 02/04/2018   BMI (kg/m^2) 42.9 39.5 34.3 32.9   Fat Mass (lbs) 121.2 106.0 76.8 70.6   Fat Free Mass (lbs) 113.6 110.0 111.0 109.2   Total Body Water (lbs) 82.8 79.4 79.0 77.2     Information Reviewed/ Discussed During Appointment: -Review of composition scale numbers -Fluid requirements (64-100 ounces) -Protein requirements (60-80g) -Strategies for tolerating diet -Advancement of diet to include Starchy vegetables -Barriers to inclusion of new foods -Inclusion of appropriate multivitamin and calcium supplements  -Exercise recommendations   Fluid intake: adequate  Medications: see list Supplementation: appropriate   Using straws: no Drinking while eating:  no Having you been chewing well: yes Chewing/swallowing difficulties: no Changes in vision: no Changes to mood/headaches: no Hair loss/Cahnges to skin/Changes to nails: no Any difficulty focusing or concentrating: no Sweating: no Dizziness/Lightheaded: no Palpitations: no  Carbonated beverages: no N/V/D/C/GAS: no Abdominal Pain: no  Recent physical activity:  Adequate   Progress Towards Goal(s):  In progress.  Handouts given during visit include:  Phase V diet Progression   Goals Sheet  The Benefits of Exercise are endless.....  Support Group Topics  Pt Chosen Goals:  Not chosen  Teaching Method Utilized:  Visual Auditory Hands on   Demonstrated degree of understanding via:  Teach Back   Monitoring/Evaluation:  Dietary intake, exercise, and body weight. Follow up in 3 months for 9  month post-op visit.

## 2018-05-03 ENCOUNTER — Ambulatory Visit (HOSPITAL_COMMUNITY)
Admission: RE | Admit: 2018-05-03 | Discharge: 2018-05-03 | Disposition: A | Discharge status: Home / Self Care | Payer: 59 | Source: Ambulatory Visit | Attending: "Endocrinology | Admitting: "Endocrinology

## 2018-05-03 DIAGNOSIS — E210 Primary hyperparathyroidism: Secondary | ICD-10-CM | POA: Insufficient documentation

## 2018-05-05 ENCOUNTER — Telehealth (INDEPENDENT_AMBULATORY_CARE_PROVIDER_SITE_OTHER): Payer: Self-pay | Admitting: *Deleted

## 2018-05-05 ENCOUNTER — Other Ambulatory Visit (INDEPENDENT_AMBULATORY_CARE_PROVIDER_SITE_OTHER): Payer: Self-pay | Admitting: *Deleted

## 2018-05-05 ENCOUNTER — Encounter (INDEPENDENT_AMBULATORY_CARE_PROVIDER_SITE_OTHER): Payer: Self-pay | Admitting: *Deleted

## 2018-05-05 DIAGNOSIS — Z85048 Personal history of other malignant neoplasm of rectum, rectosigmoid junction, and anus: Secondary | ICD-10-CM

## 2018-05-05 MED ORDER — PEG 3350-KCL-NA BICARB-NACL 420 G PO SOLR: 4000.0000 mL | Freq: Once | ORAL | 0 refills | Status: AC

## 2018-05-05 NOTE — Progress Notes (Signed)
rilyte instructions

## 2018-05-05 NOTE — Telephone Encounter (Signed)
Patient needs trilyte 

## 2018-05-18 ENCOUNTER — Encounter: Payer: Self-pay | Admitting: "Endocrinology

## 2018-05-18 ENCOUNTER — Telehealth (INDEPENDENT_AMBULATORY_CARE_PROVIDER_SITE_OTHER): Payer: Self-pay | Admitting: *Deleted

## 2018-05-18 ENCOUNTER — Ambulatory Visit (INDEPENDENT_AMBULATORY_CARE_PROVIDER_SITE_OTHER): Payer: 59 | Admitting: "Endocrinology

## 2018-05-18 VITALS — BP 136/88 | HR 65 | Ht 62.0 in | Wt 185.0 lb

## 2018-05-18 DIAGNOSIS — E210 Primary hyperparathyroidism: Secondary | ICD-10-CM | POA: Diagnosis not present

## 2018-05-18 NOTE — Telephone Encounter (Signed)
agree

## 2018-05-18 NOTE — Progress Notes (Signed)
Endocrinology follow-up note       05/18/2018, 4:51 PM  Caitlin Sampson is a 57 y.o.-year-old female, referred by her PCP, Dr. Moshe Cipro, for evaluation for hypercalcemia/hyperparathyroidism.   Past Medical History:  Diagnosis Date  . Anxiety   . Complication of anesthesia    felt strangled when woke up  . Diabetes mellitus without complication ([place])    type 2 diet controlled  . Diverticulosis of colon 2012  . GERD (gastroesophageal reflux disease)   . History of hiatal hernia   . History of kidney stones   . Hyperlipidemia   . Hypertension   . IDA (iron deficiency anemia)    hx of iron def.  . Poison oak dermatitis 2012    Past Surgical History:  Procedure Laterality Date  . ABDOMINAL HYSTERECTOMY    . CARDIAC CATHETERIZATION  09/05/03   EF 57%. NORMAL CORONARY ARTERIES. NO EVIDENCE OF RENAL ARTERY STENOSIS.  Marland Kitchen CHOLECYSTECTOMY    . ESOPHAGOGASTRODUODENOSCOPY N/A 10/17/2015   Procedure: ESOPHAGOGASTRODUODENOSCOPY (EGD);  Surgeon: Rogene Houston, MD;  [facility];  Service: Endoscopy;  Laterality: N/A;  3:00  . LAPAROSCOPIC GASTRIC SLEEVE RESECTION N/A 05/05/2017   Procedure: LAPAROSCOPIC GASTRIC SLEEVE RESECTION, UPPER ENDO;  Surgeon: Johnathan Hausen, MD;  Location: [facility];  Service: General;  Laterality: N/A;  . MYOCARDIAL PERFUSION STUDY  03/30/09   NORMAL. EF 76%.  . sleeve gastrectomy     05-05-18  . TRANSTHORACIC ECHOCARDIOGRAM  10/10/08   NORMAL. EF => 55%.    Social History   Tobacco Use  . Smoking status: Former Research scientist (life sciences)  . Smokeless tobacco: Never Used  . Tobacco comment: quit smoking for over 29 yrs.   Substance Use Topics  . Alcohol use: No    Alcohol/week: 0.0 standard drinks  . Drug use: No    Outpatient Encounter Medications as of 05/18/2018  Medication Sig  . aspirin 81 MG tablet Take 81 mg by mouth at bedtime.   Marland Kitchen FLUoxetine (PROZAC) 40 MG capsule Take 1 capsule (40 mg total) by  mouth daily. (Patient taking differently: Take 40 mg by mouth at bedtime. )  . folic acid (FOLVITE) 355 MCG tablet Take 400 mcg by mouth at bedtime.   Marland Kitchen losartan-hydrochlorothiazide (HYZAAR) 100-12.5 MG tablet Half tablet once daily efective 05/18/2017 (Patient taking differently: Take 0.5 tablets by mouth daily. Half tablet once daily efective 05/18/2017)  . Multiple Vitamins-Minerals (BARIATRIC MULTIVITAMINS/IRON PO) Take 1 tablet by mouth daily.  . pantoprazole (PROTONIX) 40 MG tablet Take 20 mg by mouth at bedtime.    No facility-administered encounter medications on file as of 05/18/2018.     Allergies  Allergen Reactions  . Ivp Dye [Iodinated Diagnostic Agents] Itching     HPI  Caitlin Sampson was diagnosed with hypercalcemia in May 2018. I reviewed patient's pertinent labs:  Lab Results  Component Value Date   PTH 75 (H) 02/20/2018   PTH Comment 02/20/2018   PTH 69 (H) 05/06/2017   PTH Comment 05/06/2017   CALCIUM 10.4 (H) 02/20/2018   CALCIUM 10.5 (H) 09/17/2017   CALCIUM 10.3 (H) 05/06/2017   CALCIUM 10.5 (H) 05/04/2017   CALCIUM 10.7 (  H) 01/21/2017   CALCIUM 10.6 (H) 09/22/2016   CALCIUM 10.0 04/14/2016   CALCIUM 9.9 08/09/2015   CALCIUM 10.1 07/06/2014   CALCIUM 9.8 02/21/2014   -Her recent bone density is negative for osteopenia/osteoporosis.    She  has history of nephrolithiasis in the remote past.  No prior history of fragility fractures or falls.   No history of CKD. Last BUN/Cr: Lab Results  Component Value Date   BUN 13 02/20/2018   CREATININE 0.64 02/20/2018    she is not on HCTZ or other thiazide therapy.  No history of  vitamin D deficiency. Last vitamin D level was 43.4 on Sep 17, 2017.  she is not on calcium supplements,  she eats dairy and green, leafy, vegetables on average amounts.  she does not have a family history of hypercalcemia, pituitary tumors, thyroid cancer, or osteoporosis.   I reviewed her chart and she also has a history of type  2 diabetes which is controlled after her recent sleeve gastrectomy.  She is currently taking no medications for diabetes.   ROS:  Constitutional: + Weight is stabilizing after she Lost 70 pounds since her surgery, no fatigue, no subjective hyperthermia, no subjective hypothermia Eyes: no blurry vision, no xerophthalmia ENT: no sore throat, no nodules palpated in throat, no dysphagia/odynophagia, no hoarseness Musculoskeletal: no muscle/joint aches Skin: no rashes Neurological: no tremors, no numbness, no tingling, no dizziness Psychiatric: no depression, no anxiety  PE: BP 136/88   Pulse 65   Ht 5\' 2"  (1.575 m)   Wt 185 lb (83.9 kg)   BMI 33.84 kg/m  Wt Readings from Last 3 Encounters:  05/18/18 185 lb (83.9 kg)  04/29/18 183 lb 6.4 oz (83.2 kg)  04/20/18 183 lb (83 kg)   Constitutional:  + Obese for height, not in acute distress, normal state of mind Eyes: PERRLA, EOMI, no exophthalmos ENT: moist mucous membranes, no thyromegaly, no cervical lymphadenopathy Musculoskeletal: no gross deformities, strength intact in all four extremities Skin: moist, warm, no rashes Neurological: no tremor with outstretched hands, Deep tendon reflexes normal in all four extremities.   CMP     Component Value Date/Time   NA 141 02/20/2018 0831   K 3.8 02/20/2018 0831   CL 100 02/20/2018 0831   CO2 27 02/20/2018 0831   GLUCOSE 91 02/20/2018 0831   GLUCOSE 112 (H) 05/04/2017 0936   BUN 13 02/20/2018 0831   CREATININE 0.64 02/20/2018 0831   CREATININE 0.67 04/14/2016 0921   CALCIUM 10.4 (H) 02/20/2018 0831   CALCIUM 10.3 (H) 05/06/2017 0830   PROT 6.4 09/17/2017 0826   ALBUMIN 4.3 09/17/2017 0826   AST 18 09/17/2017 0826   ALT 14 09/17/2017 0826   ALKPHOS 84 09/17/2017 0826   BILITOT 0.4 09/17/2017 0826   GFRNONAA 101 02/20/2018 0831   GFRNONAA >89 04/14/2016 0921   GFRAA 116 02/20/2018 0831   GFRAA >89 04/14/2016 0921     Diabetic Labs (most recent): Lab Results  Component  Value Date   HGBA1C 5.9 (H) 09/17/2017   HGBA1C 6.2 (H) 05/04/2017   HGBA1C 6.4 (H) 01/21/2017     Lipid Panel ( most recent) Lipid Panel     Component Value Date/Time   CHOL 234 (H) 02/20/2018 0831   CHOL 236 (H) 06/15/2013 0849   TRIG 113 02/20/2018 0831   TRIG 132 06/15/2013 0849   HDL 82 02/20/2018 0831   HDL 64 06/15/2013 0849   CHOLHDL 2.9 02/20/2018 0831   CHOLHDL 2.2 04/14/2016 5009  VLDL 26 04/14/2016 0921   LDLCALC 129 (H) 02/20/2018 0831   LDLCALC 146 (H) 06/15/2013 0849      Lab Results  Component Value Date   TSH 3.740 10/01/2016   TSH 2.15 08/09/2015   TSH 2.244 02/21/2014   TSH 2.871 10/28/2012   TSH 3.148 10/07/2011   TSH 2.084 05/03/2010    Results for TUYET, BADER (MRN [id number]) as of 05/18/2018 15:47  Ref. Range 04/26/2018 09:49  Creatinine, Urine Latest Ref Range: Not Estab. mg/dL 116.8  Calcium, Urine Latest Ref Range: Not Estab. mg/dL 23.4  Calcium, 24H Urine Latest Ref Range: 100.0 - 300.0 mg/24 hr 210.6  Creatinine, 24H Ur Latest Ref Range: 800 - 1,800 mg/24 hr 1,051    Assessment: 1. Hypercalcemia / Hyperparathyroidism  Plan: Patient has had several instances of elevated calcium, with the highest level being at 10.7 mg/dL.  Recent labs show calcium of 10.4 elevated with a corresponding PTH of 75.    - Patient does not have vitamin D deficiency with the last level being 43.4.  Her previsit labs show 24-hour urine calcium of 210 mg/day.  -She has a remote past history of nephrolithiasis, may not be related to her recent hyperparathyroidism/hypercalcemia.   -No history of osteoporosis, fragility fractures.  Her bone density is negative for osteopenia/osteoporosis.   No abdominal pain, no major mood disorders, no bone pain. -Etiology of her hypercalcemia is most likely primary hyperparathyroidism. -She will benefit from surgical exploration.  I discussed and referred her to Dr. Armandina Gemma for same. -She will return in 3 months with labs  after her surgery.  - Time spent with the patient: 15 min, of which >50% was spent in reviewing her  current and  previous labs, previous treatments, imaging studies,  and developing a plan for long-term care.  Rubye Beach participated in the discussions, expressed understanding, and voiced agreement with the above plans.  All questions were answered to her satisfaction. she is encouraged to contact clinic should she have any questions or concerns prior to her return visit.   - Return in about 3 months (around 08/17/2018) for Follow up with Labs after Surgery.   Glade Lloyd, MD [facility] [address] [place], [place] [address] Phone: [phone]  Fax: [phone]    This note was partially dictated with voice recognition software. Similar sounding words can be transcribed inadequately or may not  be corrected upon review.  05/18/2018, 4:51 PM

## 2018-05-18 NOTE — Telephone Encounter (Signed)
Referring MD/PCP: simpson   Procedure: tcs  Reason/Indication:  Hx rectal cancer Yes, 2014  Has patient had this procedure before?  no  If so, when, by whom and where?    Is there a family history of colon cancer?  no  Who?  What age when diagnosed?    Is patient diabetic?   no      Does patient have prosthetic heart valve or mechanical valve?  no  Do you have a pacemaker?  no  Has patient ever had endocarditis? no  Has patient had joint replacement within last 12 months?  no  Is patient constipated or do they take laxatives? no  Does patient have a history of alcohol/drug use?  no  Is patient on blood thinner such as Coumadin, Plavix and/or Aspirin? yes  Medications: see epic  Allergies: see epic   Medication Adjustment per Dr Lindi Adie, NP: asa 2 days  Procedure date & time: 06/14/18 at 7

## 2018-06-01 ENCOUNTER — Other Ambulatory Visit: Payer: Self-pay | Admitting: Family Medicine

## 2018-06-14 ENCOUNTER — Encounter (HOSPITAL_COMMUNITY): Payer: Self-pay | Admitting: *Deleted

## 2018-06-14 ENCOUNTER — Ambulatory Visit (HOSPITAL_COMMUNITY)
Admission: RE | Admit: 2018-06-14 | Discharge: 2018-06-14 | Disposition: A | Discharge status: Home / Self Care | Payer: 59 | Attending: Internal Medicine | Admitting: Internal Medicine

## 2018-06-14 ENCOUNTER — Other Ambulatory Visit: Payer: Self-pay

## 2018-06-14 ENCOUNTER — Encounter (HOSPITAL_COMMUNITY)
Admission: RE | Disposition: A | Discharge status: Home / Self Care | Payer: Self-pay | Source: Home / Self Care | Attending: Internal Medicine

## 2018-06-14 ENCOUNTER — Telehealth: Payer: Self-pay | Admitting: Family Medicine

## 2018-06-14 DIAGNOSIS — Z08 Encounter for follow-up examination after completed treatment for malignant neoplasm: Secondary | ICD-10-CM | POA: Insufficient documentation

## 2018-06-14 DIAGNOSIS — F419 Anxiety disorder, unspecified: Secondary | ICD-10-CM | POA: Insufficient documentation

## 2018-06-14 DIAGNOSIS — K5730 Diverticulosis of large intestine without perforation or abscess without bleeding: Secondary | ICD-10-CM | POA: Diagnosis not present

## 2018-06-14 DIAGNOSIS — I10 Essential (primary) hypertension: Secondary | ICD-10-CM | POA: Insufficient documentation

## 2018-06-14 DIAGNOSIS — Z7982 Long term (current) use of aspirin: Secondary | ICD-10-CM | POA: Insufficient documentation

## 2018-06-14 DIAGNOSIS — Z85048 Personal history of other malignant neoplasm of rectum, rectosigmoid junction, and anus: Secondary | ICD-10-CM | POA: Insufficient documentation

## 2018-06-14 DIAGNOSIS — Z91041 Radiographic dye allergy status: Secondary | ICD-10-CM | POA: Insufficient documentation

## 2018-06-14 DIAGNOSIS — K449 Diaphragmatic hernia without obstruction or gangrene: Secondary | ICD-10-CM | POA: Insufficient documentation

## 2018-06-14 DIAGNOSIS — Z1211 Encounter for screening for malignant neoplasm of colon: Secondary | ICD-10-CM

## 2018-06-14 DIAGNOSIS — Z87891 Personal history of nicotine dependence: Secondary | ICD-10-CM | POA: Diagnosis not present

## 2018-06-14 DIAGNOSIS — Z87442 Personal history of urinary calculi: Secondary | ICD-10-CM | POA: Diagnosis not present

## 2018-06-14 DIAGNOSIS — Z8249 Family history of ischemic heart disease and other diseases of the circulatory system: Secondary | ICD-10-CM | POA: Insufficient documentation

## 2018-06-14 DIAGNOSIS — Z79899 Other long term (current) drug therapy: Secondary | ICD-10-CM | POA: Diagnosis not present

## 2018-06-14 DIAGNOSIS — K219 Gastro-esophageal reflux disease without esophagitis: Secondary | ICD-10-CM | POA: Insufficient documentation

## 2018-06-14 DIAGNOSIS — Z85040 Personal history of malignant carcinoid tumor of rectum: Secondary | ICD-10-CM | POA: Insufficient documentation

## 2018-06-14 HISTORY — DX: Diverticulitis of intestine, part unspecified, without perforation or abscess without bleeding: K57.92

## 2018-06-14 HISTORY — PX: COLONOSCOPY: SHX5424

## 2018-06-14 SURGERY — COLONOSCOPY
Anesthesia: Moderate Sedation

## 2018-06-14 MED ORDER — MIDAZOLAM HCL 5 MG/5ML IJ SOLN
INTRAMUSCULAR | Status: DC | PRN
  Administered 2018-06-14: 1 mg via INTRAVENOUS
  Administered 2018-06-14 (×2): 2 mg via INTRAVENOUS
  Administered 2018-06-14: 1 mg via INTRAVENOUS

## 2018-06-14 MED ORDER — SODIUM CHLORIDE 0.9 % IV SOLN
INTRAVENOUS | Status: DC
  Administered 2018-06-14: 07:00:00 via INTRAVENOUS

## 2018-06-14 MED ORDER — MIDAZOLAM HCL 5 MG/5ML IJ SOLN
INTRAMUSCULAR | Status: AC
  Filled 2018-06-14: qty 10

## 2018-06-14 MED ORDER — STERILE WATER FOR IRRIGATION IR SOLN
Status: DC | PRN
  Administered 2018-06-14: 08:00:00

## 2018-06-14 MED ORDER — MEPERIDINE HCL 50 MG/ML IJ SOLN
INTRAMUSCULAR | Status: AC
  Filled 2018-06-14: qty 1

## 2018-06-14 MED ORDER — MEPERIDINE HCL 50 MG/ML IJ SOLN
INTRAMUSCULAR | Status: DC | PRN
  Administered 2018-06-14 (×2): 25 mg

## 2018-06-14 NOTE — Op Note (Signed)
[facility] Patient Name: Caitlin Sampson Procedure Date: 06/14/2018 7:10 AM MRN: [id number] Date of Birth: July 23, 1960 Attending MD: Hildred Laser , MD CSN: [id number] Age: 58 Admit Type: Outpatient Procedure:                Colonoscopy Indications:              Carcinoid tumor of the rectum Providers:                Hildred Laser, MD, Janeece Riggers, RN, Gerome Sam, RN Referring MD:             Norwood Levo. Simpson MD, MD Medicines:                Meperidine 50 mg IV, Midazolam 6 mg IV Complications:            No immediate complications. Estimated Blood Loss:     Estimated blood loss: none. Estimated blood loss:                            none. Procedure:                Pre-Anesthesia Assessment:                           - Prior to the procedure, a History and Physical                            was performed, and patient medications and                            allergies were reviewed. The patient's tolerance of                            previous anesthesia was also reviewed. The risks                            and benefits of the procedure and the sedation                            options and risks were discussed with the patient.                            All questions were answered, and informed consent                            was obtained. Prior Anticoagulants: The patient                            last took aspirin 3 days prior to the procedure.                            ASA Grade Assessment: II - A patient with mild  systemic disease. After reviewing the risks and                            benefits, the patient was deemed in satisfactory                            condition to undergo the procedure.                           After obtaining informed consent, the colonoscope                            was passed under direct vision. Throughout the                            procedure, the patient's blood  pressure, pulse, and                            oxygen saturations were monitored continuously. The                            [id number]-[id number] ([id number]) scope was introduced through                            the anus and advanced to the the cecum, identified                            by appendiceal orifice and ileocecal valve. The                            ileocecal valve, appendiceal orifice, and rectum                            were photographed. Scope In: 7:45:24 AM Scope Out: 7:59:14 AM Scope Withdrawal Time: 0 hours 7 minutes 50 seconds  Total Procedure Duration: 0 hours 13 minutes 50 seconds  Findings:      The perianal and digital rectal examinations were normal.      Scattered large-mouthed diverticula were found in the sigmoid colon.      The exam was otherwise normal throughout the examined colon.      The retroflexed view of the distal rectum and anal verge was normal and       showed no anal or rectal abnormalities. Impression:               - Diverticulosis in the sigmoid colon.                           - No specimens collected.                           Comment: no evidence of recurrent rectal carcinoid. Moderate Sedation:      Moderate (conscious) sedation was administered by the endoscopy nurse       and supervised by the endoscopist. The following parameters were       monitored: oxygen saturation, heart rate, blood pressure,  CO2       capnography and response to care. Total physician intraservice time was       21 minutes. Recommendation:           - Patient has a contact number available for                            emergencies. The signs and symptoms of potential                            delayed complications were discussed with the                            patient. Return to normal activities tomorrow.                            Written discharge instructions were provided to the                            patient.                           - High fiber diet  today.                           - Continue present medications.                           - Resume aspirin at prior dose today.                           - Repeat colonoscopy in 10 years. Procedure Code(s):        --- Professional ---                           [phone], Colonoscopy, flexible; diagnostic, including                            collection of specimen(s) by brushing or washing,                            when performed (separate procedure)                           G0500, Moderate sedation services provided by the                            same physician or other qualified health care                            professional performing a gastrointestinal                            endoscopic service that sedation supports,                            requiring the  presence of an independent trained                            observer to assist in the monitoring of the                            patient's level of consciousness and physiological                            status; initial 15 minutes of intra-service time;                            patient age 53 years or older (additional time may                            be reported with [phone], as appropriate) Diagnosis Code(s):        --- Professional ---                           K57.30, Diverticulosis of large intestine without                            perforation or abscess without bleeding                           C7A.026, Malignant carcinoid tumor of the rectum CPT copyright 2018 American Medical Association. All rights reserved. The codes documented in this report are preliminary and upon coder review may  be revised to meet current compliance requirements. Hildred Laser, MD Hildred Laser, MD 06/14/2018 8:11:03 AM This report has been signed electronically. Number of Addenda: 0

## 2018-06-14 NOTE — H&P (Signed)
Caitlin Sampson is an 58 y.o. female.   Chief Complaint: Patient is here for colonoscopy. HPI: Patient is 58 year old Caucasian female with history of rectal carcinoid.  She had lesion removed in 2014.  Margins were clear.  She had a sigmoidoscopy a year later and no recurrence was noted.  These exams were performed by Dr. Truddie Hidden of [facility].  She was advised to return for follow-up exam in 5 years. Patient denies abdominal pain change in bowel habits or rectal bleeding. Family history is negative for CRC.  Past Medical History:  Diagnosis Date  . Anxiety   . Complication of anesthesia    felt strangled when woke up  . Diverticulitis   . Diverticulosis of colon 2012  . GERD (gastroesophageal reflux disease)   . History of hiatal hernia   . History of kidney stones   . Hyperlipidemia   . Hypertension   . IDA (iron deficiency anemia)    hx of iron def.  . Poison oak dermatitis 2012        History of rectal carcinoid.  Past Surgical History:  Procedure Laterality Date  . ABDOMINAL HYSTERECTOMY    . CARDIAC CATHETERIZATION  09/05/03   EF 57%. NORMAL CORONARY ARTERIES. NO EVIDENCE OF RENAL ARTERY STENOSIS.  Marland Kitchen CHOLECYSTECTOMY    . ESOPHAGOGASTRODUODENOSCOPY N/A 10/17/2015   Procedure: ESOPHAGOGASTRODUODENOSCOPY (EGD);  Surgeon: Rogene Houston, MD;  Location: [facility];  Service: Endoscopy;  Laterality: N/A;  3:00  . LAPAROSCOPIC GASTRIC SLEEVE RESECTION N/A 05/05/2017   Procedure: LAPAROSCOPIC GASTRIC SLEEVE RESECTION, UPPER ENDO;  Surgeon: Johnathan Hausen, MD;  Location: [facility] ORS;  Service: General;  Laterality: N/A;  . MYOCARDIAL PERFUSION STUDY  03/30/09   NORMAL. EF 76%.  . sleeve gastrectomy     05-05-18  . TRANSTHORACIC ECHOCARDIOGRAM  10/10/08   NORMAL. EF => 55%.    Family History  Problem Relation Age of Onset  . Heart disease Mother        a fib  . Kidney disease Mother   . Heart disease Father        MI  . Hyperlipidemia Father   . Hypertension Father    . Kidney disease Father   . Kidney disease Brother   . Diabetes Maternal Grandmother   . Stroke Maternal Grandmother   . Colon cancer Neg Hx    Social History:  reports that she has quit smoking. She has never used smokeless tobacco. She reports that she does not drink alcohol or use drugs.  Allergies:  Allergies  Allergen Reactions  . Ivp Dye [Iodinated Diagnostic Agents] Itching    Medications Prior to Admission  Medication Sig Dispense Refill  . aspirin EC 81 MG tablet Take 81 mg by mouth at bedtime.    . cephALEXin (KEFLEX) 500 MG capsule Take 500 mg by mouth 2 (two) times daily.    Marland Kitchen FLUoxetine (PROZAC) 40 MG capsule TAKE 1 CAPSULE BY MOUTH  DAILY (Patient taking differently: Take 40 mg by mouth every evening. ) 90 capsule 1  . folic acid (FOLVITE) 992 MCG tablet Take 400 mcg by mouth 2 (two) times a week. At night    . losartan-hydrochlorothiazide (HYZAAR) 100-12.5 MG tablet Half tablet once daily efective 05/18/2017 (Patient taking differently: Take 0.5 tablets by mouth every evening. ) 45 tablet 3  . Multiple Vitamins-Minerals (BARIATRIC MULTIVITAMINS/IRON PO) Take 1 tablet by mouth daily. CHEWABLE    . pantoprazole (PROTONIX) 40 MG tablet Take 20 mg by mouth at bedtime.  No results found for this or any previous visit (from the past 48 hour(s)). No results found.  ROS  Blood pressure (!) 154/90, pulse 83, temperature 98 F (36.7 C), temperature source Oral, resp. rate 19, height 5\' 2"  (1.575 m), weight 81.2 kg, SpO2 94 %. Physical Exam  Constitutional: She appears well-developed and well-nourished.  HENT:  Mouth/Throat: Oropharynx is clear and moist.  Eyes: Conjunctivae are normal. No scleral icterus.  Neck: No thyromegaly present.  Cardiovascular: Normal rate, regular rhythm and normal heart sounds.  No murmur heard. Respiratory: Effort normal and breath sounds normal.  GI:  Abdomen is full.  Laparoscopy scars noted across upper abdomen.  Abdomen is soft and  nontender with organomegaly or masses.  Musculoskeletal:        General: No edema.  Lymphadenopathy:    She has no cervical adenopathy.  Neurological: She is alert.  Skin: Skin is warm and dry.     Assessment/Plan History of rectal carcinoid. Surveillance colonoscopy.  Hildred Laser, MD 06/14/2018, 7:33 AM

## 2018-06-14 NOTE — Discharge Instructions (Signed)
Colonoscopy, Adult, Care After This sheet gives you information about how to care for yourself after your procedure. Your health care provider may also give you more specific instructions. If you have problems or questions, contact your health care provider. What can I expect after the procedure? After the procedure, it is common to have:  A small amount of blood in your stool for 24 hours after the procedure.  Some gas.  Mild abdominal cramping or bloating. Follow these instructions at home: General instructions  For the first 24 hours after the procedure: ? Do not drive or use machinery. ? Do not sign important documents. ? Do not drink alcohol. ? Do your regular daily activities at a slower pace than normal. ? Eat soft, easy-to-digest foods.  Take over-the-counter or prescription medicines only as told by your health care provider. Relieving cramping and bloating   Try walking around when you have cramps or feel bloated.  Apply heat to your abdomen as told by your health care provider. Use a heat source that your health care provider recommends, such as a moist heat pack or a heating pad. ? Place a towel between your skin and the heat source. ? Leave the heat on for 20-30 minutes. ? Remove the heat if your skin turns bright red. This is especially important if you are unable to feel pain, heat, or cold. You may have a greater risk of getting burned. Eating and drinking   Drink enough fluid to keep your urine pale yellow.  Resume your normal diet as instructed by your health care provider. Avoid heavy or fried foods that are hard to digest.  Avoid drinking alcohol for as long as instructed by your health care provider. Contact a health care provider if:  You have blood in your stool 2-3 days after the procedure. Get help right away if:  You have more than a small spotting of blood in your stool.  You pass large blood clots in your stool.  Your abdomen is  swollen.  You have nausea or vomiting.  You have a fever.  You have increasing abdominal pain that is not relieved with medicine. Summary  After the procedure, it is common to have a small amount of blood in your stool. You may also have mild abdominal cramping and bloating.  For the first 24 hours after the procedure, do not drive or use machinery, sign important documents, or drink alcohol.  Contact your health care provider if you have a lot of blood in your stool, nausea or vomiting, a fever, or increased abdominal pain. This information is not intended to replace advice given to you by your health care provider. Make sure you discuss any questions you have with your health care provider. Document Released: 12/18/2003 Document Revised: 02/25/2017 Document Reviewed: 07/17/2015 Elsevier Interactive Patient Education  2019 [place] usual medications including aspirin as before. High-fiber diet. No driving for 24 hours. Can wait 10 years before next colonoscopy.

## 2018-06-14 NOTE — Telephone Encounter (Signed)
No call made , incorrect record

## 2018-06-16 ENCOUNTER — Encounter (HOSPITAL_COMMUNITY): Payer: Self-pay | Admitting: Internal Medicine

## 2018-07-29 ENCOUNTER — Encounter: Payer: 59 | Attending: Surgery | Admitting: Skilled Nursing Facility1

## 2018-07-29 ENCOUNTER — Other Ambulatory Visit: Payer: Self-pay

## 2018-07-29 DIAGNOSIS — Z713 Dietary counseling and surveillance: Secondary | ICD-10-CM | POA: Diagnosis not present

## 2018-07-29 DIAGNOSIS — E669 Obesity, unspecified: Secondary | ICD-10-CM

## 2018-07-29 DIAGNOSIS — Z6832 Body mass index (BMI) 32.0-32.9, adult: Secondary | ICD-10-CM | POA: Diagnosis not present

## 2018-07-29 DIAGNOSIS — Z9884 Bariatric surgery status: Secondary | ICD-10-CM | POA: Diagnosis not present

## 2018-07-29 NOTE — Patient Instructions (Addendum)
-  Eat at least 50 grams of carbohydrate a day  -Eat until satisfaction not fullness  -Do not drink 15 minutes before you eat while you are eating and wait until 30 minutes after before you drink again  -Get to the gym 3 days a week for 60 minutes including cardio and resistance

## 2018-07-29 NOTE — Progress Notes (Signed)
Follow-up visit:  Post-Operative  Surgery  Medical Nutrition Therapy:  Appt start time: 6:00pm end time:  7:00pm  Primary concerns today: Post-operative Bariatric Surgery Nutrition Management 6 Month Post-Op Class  Pt states her calcium level is high and her doctor is watching it. Pt states she wants to lose 30 pounds. Pt states she joined the gym but has not been due to her mothers illness stating her father is also sick. Pt states she does not eat peas or corn because she does not care for it. Pt states she does not want to carbohydrate because it converts to sugar. Pt states her doctor advised her not to take her calcium due to er calcium levels being high. Pt states she sleeps well at night with good energy.  Pt will not eat eggs for one month before her cholesterol test to see if eggs care the culprit.   Surgery date: 05/05/2017 Surgery type: Sleeve Start weight at [facility]: 252 Weight today: 183.4  24 hr recall: 2 scrambled eggs with cheese or sometimes a protein shake   Half Salad out with ranch or vinaigrette or chicken or shrimp with non starchy vegetable  Hamburger patty or chicken in a crockpot  With non starchy vegetable strawberry  Fluid intake: water, unsweet tea with alternative sugar,   Medications: see list Supplementation: celebrate with iron   Using straws: no Drinking while eating:  no Having you been chewing well: yes Chewing/swallowing difficulties: no Changes in vision: no Changes to mood/headaches: no Hair loss/Cahnges to skin/Changes to nails: no Any difficulty focusing or concentrating: no Sweating: no Dizziness/Lightheaded: no Palpitations: no  Carbonated beverages: no N/V/D/C/GAS: no Abdominal Pain: no  Recent physical activity:  ADL's  Progress Towards Goal(s):  In progress. Goals: -Eat at least 50 grams of carbohydrate a day  -Eat until satisfaction not fullness  -Do not drink 15 minutes before you eat while you are eating and wait until 30  minutes after before you drink again  -Get to the gym 3 days a week for 60 minutes including cardio and resistance    Teaching Method Utilized:  Visual Auditory Hands on   Demonstrated degree of understanding via:  Teach Back   Monitoring/Evaluation:  Dietary intake, exercise, and body weight. Follow up in 4 months

## 2018-08-02 ENCOUNTER — Ambulatory Visit: Payer: 59 | Admitting: Family Medicine

## 2018-08-10 ENCOUNTER — Telehealth: Payer: Self-pay | Admitting: *Deleted

## 2018-08-10 NOTE — Telephone Encounter (Signed)
Pt was referred to [facility] for colonoscopy 06/14/18 at [facility]. She then received a bill for 4,000$ between [place] and [employer]. She called the insurance company and the [employer]. She also checked with Dr. Laural Golden and they had their codes correct so she wanted to check and make sure that the codes were entered correctly here. Told her we usually have nothing to do with billing but told her I would send a note just so we could check. She was told by billing and insurance to contact us. She was requesting a call back.

## 2018-08-10 NOTE — Telephone Encounter (Signed)
Could you please assist this patient. The code we use is z12.11, special screening for malignant neoplasms of the colon. We always use this one when we refer to GI for a colonoscopy. Thank you for your help

## 2018-08-11 ENCOUNTER — Other Ambulatory Visit: Payer: Self-pay

## 2018-08-11 MED ORDER — PANTOPRAZOLE SODIUM 40 MG PO TBEC: DELAYED_RELEASE_TABLET | ORAL | 0 refills | Status: DC

## 2018-08-12 ENCOUNTER — Encounter (INDEPENDENT_AMBULATORY_CARE_PROVIDER_SITE_OTHER): Payer: Self-pay

## 2018-08-23 ENCOUNTER — Telehealth: Payer: Self-pay | Admitting: *Deleted

## 2018-08-23 NOTE — Telephone Encounter (Signed)
Message sent to patient. Waiting on response

## 2018-08-23 NOTE — Telephone Encounter (Signed)
Please contact pt to verify what dose she actually takes and how mmany times per day, that is the dose to be sent ?? pls let me know

## 2018-08-23 NOTE — Telephone Encounter (Signed)
The rx is for protonix 40mg  but the directions say take 20 mg at bedtime. What does is correct? If 20 I will change to the 20mg  dose. Please advise

## 2018-08-23 NOTE — Telephone Encounter (Signed)
[place] with [facility] called wanteing to verify the directions for Caitlin Sampson protonix 40 mg. She can be reached at [phone] Ref # [id number]

## 2018-08-24 ENCOUNTER — Other Ambulatory Visit: Payer: Self-pay | Admitting: Family Medicine

## 2018-08-24 MED ORDER — PANTOPRAZOLE SODIUM 20 MG PO TBEC: 20.0000 mg | DELAYED_RELEASE_TABLET | Freq: Every day | ORAL | 3 refills | Status: DC

## 2018-08-24 NOTE — Telephone Encounter (Signed)
Pt stated the protonx she was getting 40 mg and cutting it in half due to insurance reasons and she takes it once a day

## 2018-08-24 NOTE — Telephone Encounter (Signed)
I sent in the protonix 20 mg dose once daily, to [facility]

## 2018-08-24 NOTE — Telephone Encounter (Signed)
noted 

## 2018-08-24 NOTE — Telephone Encounter (Signed)
Left voicemail for callback.

## 2018-09-07 ENCOUNTER — Encounter (INDEPENDENT_AMBULATORY_CARE_PROVIDER_SITE_OTHER): Payer: Self-pay

## 2018-10-12 ENCOUNTER — Other Ambulatory Visit: Payer: Self-pay

## 2018-10-12 ENCOUNTER — Ambulatory Visit: Payer: 59 | Admitting: Family Medicine

## 2018-10-12 ENCOUNTER — Encounter: Payer: Self-pay | Admitting: Family Medicine

## 2018-10-12 MED ORDER — LOSARTAN POTASSIUM-HCTZ 100-12.5 MG PO TABS: ORAL_TABLET | ORAL | 0 refills | Status: DC

## 2018-10-12 MED ORDER — PANTOPRAZOLE SODIUM 20 MG PO TBEC: 20.0000 mg | DELAYED_RELEASE_TABLET | Freq: Every day | ORAL | 0 refills | Status: DC

## 2018-10-12 MED ORDER — FLUOXETINE HCL 40 MG PO CAPS: 40.0000 mg | ORAL_CAPSULE | Freq: Every day | ORAL | 0 refills | Status: DC

## 2018-11-11 ENCOUNTER — Encounter: Payer: Self-pay | Admitting: *Deleted

## 2018-11-16 ENCOUNTER — Ambulatory Visit: Payer: 59 | Admitting: Family Medicine

## 2018-12-02 ENCOUNTER — Ambulatory Visit: Payer: 59 | Admitting: Skilled Nursing Facility1

## 2018-12-29 ENCOUNTER — Other Ambulatory Visit: Payer: Self-pay | Admitting: Family Medicine

## 2019-01-13 ENCOUNTER — Encounter (HOSPITAL_COMMUNITY): Payer: Self-pay

## 2019-02-16 ENCOUNTER — Telehealth: Payer: Self-pay | Admitting: Family Medicine

## 2019-02-16 ENCOUNTER — Encounter: Payer: Self-pay | Admitting: Family Medicine

## 2019-02-16 NOTE — Telephone Encounter (Signed)
Please update the labs in the system for the patient

## 2019-02-16 NOTE — Telephone Encounter (Signed)
Needs fasting lipid, cmp and EGFR, TSH, and vit D and cBC

## 2019-02-17 ENCOUNTER — Telehealth: Payer: Self-pay

## 2019-02-17 DIAGNOSIS — E559 Vitamin D deficiency, unspecified: Secondary | ICD-10-CM

## 2019-02-17 DIAGNOSIS — E782 Mixed hyperlipidemia: Secondary | ICD-10-CM

## 2019-02-17 DIAGNOSIS — I10 Essential (primary) hypertension: Secondary | ICD-10-CM

## 2019-02-17 NOTE — Telephone Encounter (Signed)
Labs ordered.

## 2019-02-22 ENCOUNTER — Other Ambulatory Visit: Payer: Self-pay

## 2019-02-22 ENCOUNTER — Encounter: Payer: Self-pay | Admitting: Family Medicine

## 2019-02-22 ENCOUNTER — Ambulatory Visit: Payer: Self-pay | Admitting: Family Medicine

## 2019-02-22 VITALS — BP 126/77 | Ht 62.0 in | Wt 184.0 lb

## 2019-02-22 DIAGNOSIS — I10 Essential (primary) hypertension: Secondary | ICD-10-CM | POA: Diagnosis not present

## 2019-02-22 DIAGNOSIS — E66811 Obesity, class 1: Secondary | ICD-10-CM

## 2019-02-22 DIAGNOSIS — E669 Obesity, unspecified: Secondary | ICD-10-CM | POA: Diagnosis not present

## 2019-02-22 DIAGNOSIS — F411 Generalized anxiety disorder: Secondary | ICD-10-CM | POA: Diagnosis not present

## 2019-02-22 MED ORDER — PANTOPRAZOLE SODIUM 20 MG PO TBEC: 20.0000 mg | DELAYED_RELEASE_TABLET | Freq: Every day | ORAL | 1 refills | Status: DC

## 2019-02-22 MED ORDER — FLUOXETINE HCL 40 MG PO CAPS: 40.0000 mg | ORAL_CAPSULE | Freq: Every day | ORAL | 1 refills | Status: DC

## 2019-02-22 MED ORDER — LOSARTAN POTASSIUM-HCTZ 100-12.5 MG PO TABS: ORAL_TABLET | ORAL | 1 refills | Status: DC

## 2019-02-22 NOTE — Patient Instructions (Signed)
F//U in 6 months, call if you need me before   Flu vaccine next Monday as discussed  ( [facility])Fasting chem 7 and EGFR, as soon as able  It is important that you exercise regularly at least 30 minutes 5 times a week. If you develop chest pain, have severe difficulty breathing, or feel very tired, stop exercising immediately and seek medical attention   Think about what you will eat, plan ahead. Choose " clean, green, fresh or frozen" over canned, processed or packaged foods which are more sugary, salty and fatty. 70 to 75% of food eaten should be vegetables and fruit. Three meals at set times with snacks allowed between meals, but they must be fruit or vegetables. Aim to eat over a 12 hour period , example 7 am to 7 pm, and STOP after  your last meal of the day. Drink water,generally about 64 ounces per day, no other drink is as healthy. Fruit juice is best enjoyed in a healthy way, by EATING the fruit.  Thanks for choosing [facility], we consider it a privelige to serve you.

## 2019-02-22 NOTE — Progress Notes (Signed)
Virtual Visit via Telephone Note  I connected with Caitlin Sampson on 02/22/19 at 10:20 AM EDT by telephone and verified that I am speaking with the correct person using two identifiers.  Location: Patient: home Provider: office    I discussed the limitations, risks, security and privacy concerns of performing an evaluation and management service by telephone and the availability of in person appointments. I also discussed with the patient that there may be a patient responsible charge related to this service. The patient expressed understanding and agreed to proceed.   History of Present Illness: F/u chronic problems and medication maagement. Currently increased stress due to ill health of parents and unemployed, caring for them Coping as best able, and overall " doing well" Denies recent fever or chills. Denies sinus pressure, nasal congestion, ear pain or sore throat. Denies chest congestion, productive cough or wheezing. Denies chest pains, palpitations and leg swelling Denies abdominal pain, nausea, vomiting,diarrhea or constipation.   Denies dysuria, frequency, hesitancy or incontinence. Denies joint pain, swelling and limitation in mobility. Denies headaches, seizures, numbness, or tingling. Denies uncontrolled depression, anxiety or insomnia. Denies skin break down or rash.       Observations/Objective: BP 126/77   Ht 5\' 2"  (1.575 m)   Wt 184 lb (83.5 kg)   BMI 33.65 kg/m Pt report Good communication with no confusion and intact memory. Alert and oriented x 3 No signs of respiratory distress during speech    Assessment and Plan: Essential hypertension Controlled, no change in medication DASH diet and commitment to daily physical activity for a minimum of 30 minutes discussed and encouraged, as a part of hypertension management. The importance of attaining a healthy weight is also discussed.  BP/Weight 02/22/2019 07/29/2018 06/14/2018 05/18/2018 04/27/2018 04/20/2018  [id number]  Systolic BP 123XX123 - 123XX123 [id number] - A999333 [id number]  Diastolic BP 77 - 79 88 - 86 93  Wt. (Lbs) 184 184.6 179 185 183.4 183 183.6  BMI 33.65 33.76 32.74 33.84 33.54 33.47 33.58       GAD (generalized anxiety disorder) Controlled, no change in medication   Obesity (BMI 30.0-34.9)  Patient re-educated about  the importance of commitment to a  minimum of 150 minutes of exercise per week as able.  The importance of healthy food choices with portion control discussed, as well as eating regularly and within a 12 hour window most days. The need to choose "clean , green" food 50 to 75% of the time is discussed, as well as to make water the primary drink and set a goal of 64 ounces water daily.    Weight /BMI 02/22/2019 07/29/2018 06/14/2018  WEIGHT 184 lb 184 lb 9.6 oz 179 lb  HEIGHT 5\' 2"  5\' 2"  5\' 2"   BMI 33.65 kg/m2 33.76 kg/m2 32.74 kg/m2        Follow Up Instructions:    I discussed the assessment and treatment plan with the patient. The patient was provided an opportunity to ask questions and all were answered. The patient agreed with the plan and demonstrated an understanding of the instructions.   The patient was advised to call back or seek an in-person evaluation if the symptoms worsen or if the condition fails to improve as anticipated.  I provided 12 minutes of non-face-to-face time during this encounter.   Tula Nakayama, MD

## 2019-02-23 ENCOUNTER — Encounter: Payer: Self-pay | Admitting: Family Medicine

## 2019-02-23 NOTE — Assessment & Plan Note (Signed)
Controlled, no change in medication DASH diet and commitment to daily physical activity for a minimum of 30 minutes discussed and encouraged, as a part of hypertension management. The importance of attaining a healthy weight is also discussed.  BP/Weight 02/22/2019 07/29/2018 06/14/2018 05/18/2018 04/27/2018 04/20/2018 [id number]  Systolic BP 123XX123 - 123XX123 [id number] - A999333 [id number]  Diastolic BP 77 - 79 88 - 86 93  Wt. (Lbs) 184 184.6 179 185 183.4 183 183.6  BMI 33.65 33.76 32.74 33.84 33.54 33.47 33.58

## 2019-02-23 NOTE — Assessment & Plan Note (Signed)
Controlled, no change in medication  

## 2019-02-23 NOTE — Assessment & Plan Note (Signed)
  Patient re-educated about  the importance of commitment to a  minimum of 150 minutes of exercise per week as able.  The importance of healthy food choices with portion control discussed, as well as eating regularly and within a 12 hour window most days. The need to choose "clean , green" food 50 to 75% of the time is discussed, as well as to make water the primary drink and set a goal of 64 ounces water daily.    Weight /BMI 02/22/2019 07/29/2018 06/14/2018  WEIGHT 184 lb 184 lb 9.6 oz 179 lb  HEIGHT 5\' 2"  5\' 2"  5\' 2"   BMI 33.65 kg/m2 33.76 kg/m2 32.74 kg/m2

## 2019-02-28 ENCOUNTER — Ambulatory Visit (INDEPENDENT_AMBULATORY_CARE_PROVIDER_SITE_OTHER): Payer: PRIVATE HEALTH INSURANCE

## 2019-02-28 ENCOUNTER — Other Ambulatory Visit: Payer: Self-pay

## 2019-02-28 DIAGNOSIS — Z23 Encounter for immunization: Secondary | ICD-10-CM

## 2019-03-08 ENCOUNTER — Other Ambulatory Visit: Payer: Self-pay

## 2019-03-08 DIAGNOSIS — Z20822 Contact with and (suspected) exposure to covid-19: Secondary | ICD-10-CM

## 2019-03-09 LAB — NOVEL CORONAVIRUS, NAA: SARS-CoV-2, NAA: NOT DETECTED

## 2019-03-31 ENCOUNTER — Telehealth (INDEPENDENT_AMBULATORY_CARE_PROVIDER_SITE_OTHER): Payer: Self-pay | Admitting: Internal Medicine

## 2019-03-31 NOTE — Telephone Encounter (Signed)
Patient called stated she had spoken to you several months ago about the coding of a bill she received - she stated part of the bill had been taken care of but [facility] part has not - would like a call back at [phone]

## 2019-04-05 ENCOUNTER — Encounter (INDEPENDENT_AMBULATORY_CARE_PROVIDER_SITE_OTHER): Payer: Self-pay

## 2019-08-23 ENCOUNTER — Ambulatory Visit: Payer: Self-pay | Admitting: Family Medicine

## 2019-10-04 ENCOUNTER — Other Ambulatory Visit: Payer: Self-pay | Admitting: Family Medicine

## 2019-11-16 ENCOUNTER — Ambulatory Visit: Payer: Self-pay | Admitting: Family Medicine

## 2019-12-02 ENCOUNTER — Encounter (HOSPITAL_COMMUNITY): Payer: Self-pay

## 2020-01-16 ENCOUNTER — Other Ambulatory Visit: Payer: Self-pay | Admitting: *Deleted

## 2020-01-16 ENCOUNTER — Telehealth: Payer: Self-pay | Admitting: Family Medicine

## 2020-01-16 MED ORDER — LOSARTAN POTASSIUM-HCTZ 100-12.5 MG PO TABS: ORAL_TABLET | ORAL | 0 refills | Status: DC

## 2020-01-16 MED ORDER — FLUOXETINE HCL 40 MG PO CAPS: 40.0000 mg | ORAL_CAPSULE | Freq: Every day | ORAL | 0 refills | Status: DC

## 2020-01-16 MED ORDER — PANTOPRAZOLE SODIUM 20 MG PO TBEC: 20.0000 mg | DELAYED_RELEASE_TABLET | Freq: Every day | ORAL | 0 refills | Status: DC

## 2020-01-16 NOTE — Telephone Encounter (Signed)
Patient called stating her pharmacy [facility] in [place] had requested a refill for 3 meds and still have not received anything patient call back is [phone] meds are fluoxetine, pantoprazole,and losartan

## 2020-01-16 NOTE — Telephone Encounter (Signed)
Pt meds sent to [facility] in [place] pt notified with verbal understanding

## 2020-03-26 ENCOUNTER — Other Ambulatory Visit (HOSPITAL_COMMUNITY): Payer: Self-pay | Admitting: Family Medicine

## 2020-03-26 DIAGNOSIS — Z1231 Encounter for screening mammogram for malignant neoplasm of breast: Secondary | ICD-10-CM

## 2020-03-29 ENCOUNTER — Encounter: Payer: Self-pay | Admitting: Family Medicine

## 2020-04-02 ENCOUNTER — Encounter: Payer: Self-pay | Admitting: Family Medicine

## 2020-04-02 ENCOUNTER — Ambulatory Visit (INDEPENDENT_AMBULATORY_CARE_PROVIDER_SITE_OTHER): Payer: 59 | Admitting: Family Medicine

## 2020-04-02 ENCOUNTER — Other Ambulatory Visit: Payer: Self-pay

## 2020-04-02 VITALS — BP 168/100 | HR 82 | Resp 16 | Ht 62.0 in | Wt 207.0 lb

## 2020-04-02 DIAGNOSIS — E782 Mixed hyperlipidemia: Secondary | ICD-10-CM

## 2020-04-02 DIAGNOSIS — I10 Essential (primary) hypertension: Secondary | ICD-10-CM

## 2020-04-02 DIAGNOSIS — R7301 Impaired fasting glucose: Secondary | ICD-10-CM

## 2020-04-02 DIAGNOSIS — Z23 Encounter for immunization: Secondary | ICD-10-CM

## 2020-04-02 DIAGNOSIS — E669 Obesity, unspecified: Secondary | ICD-10-CM

## 2020-04-02 DIAGNOSIS — K219 Gastro-esophageal reflux disease without esophagitis: Secondary | ICD-10-CM

## 2020-04-02 DIAGNOSIS — F324 Major depressive disorder, single episode, in partial remission: Secondary | ICD-10-CM

## 2020-04-02 DIAGNOSIS — R7303 Prediabetes: Secondary | ICD-10-CM

## 2020-04-02 LAB — POCT GLYCOSYLATED HEMOGLOBIN (HGB A1C): Hemoglobin A1C: 6.1 % — AB (ref 4.0–5.6)

## 2020-04-02 MED ORDER — PANTOPRAZOLE SODIUM 20 MG PO TBEC: 20.0000 mg | DELAYED_RELEASE_TABLET | Freq: Every day | ORAL | 1 refills | Status: DC

## 2020-04-02 MED ORDER — LOSARTAN POTASSIUM-HCTZ 100-12.5 MG PO TABS: 1.0000 | ORAL_TABLET | Freq: Every day | ORAL | 1 refills | Status: DC

## 2020-04-02 MED ORDER — FLUOXETINE HCL 40 MG PO CAPS: 40.0000 mg | ORAL_CAPSULE | Freq: Every day | ORAL | 1 refills | Status: DC

## 2020-04-02 NOTE — Progress Notes (Signed)
Caitlin Sampson     MRN: [id number]      DOB: 04-Feb-1961   HPI Caitlin Sampson is here for follow up and re-evaluation of chronic medical conditions, medication management and review of any available recent lab and radiology data.  Preventive health is updated, specifically  Cancer screening and Immunization.    The PT denies any adverse reactions to current medications since the last visit.  There are no new concerns.  There are no specific complaints   ROS Denies recent fever or chills. Denies sinus pressure, nasal congestion, ear pain or sore throat. Denies chest congestion, productive cough or wheezing. Denies chest pains, palpitations and leg swelling Denies abdominal pain, nausea, vomiting,diarrhea or constipation.   Denies dysuria, frequency, hesitancy or incontinence. Denies joint pain, swelling and limitation in mobility. Denies headaches, seizures, numbness, or tingling. Increased stress due to ill health of parent and spouse and was unempl0yed for some time. Denies skin break down or rash.   PE  BP (!) 168/100   Pulse 82   Resp 16   Ht 5\' 2"  (1.575 m)   Wt 207 lb (93.9 kg)   SpO2 97%   BMI 37.86 kg/m   Patient alert and oriented and in no cardiopulmonary distress.  HEENT: No facial asymmetry, EOMI,     Neck supple .  Chest: Clear to auscultation bilaterally.  CVS: S1, S2 no murmurs, no S3.Regular rate.  ABD: Soft non tender.   Ext: No edema  MS: Adequate ROM spine, shoulders, hips and knees.  Skin: Intact, no ulcerations or rash noted.  Psych: Good eye contact, . Memory intact  anxious but not depressed appearing.  CNS: CN 2-12 intact, power,  normal throughout.no focal deficits noted.   Assessment & Plan  Essential hypertension Uncontrolled , dose increase in medication and close f/u DASH diet and commitment to daily physical activity for a minimum of 30 minutes discussed and encouraged, as a part of hypertension management. The importance of  attaining a healthy weight is also discussed.  BP/Weight 04/02/2020 02/22/2019 07/29/2018 06/14/2018 05/18/2018 04/27/2018 98/07/3823  Systolic BP 053 976 - 734 193 - 790  Diastolic BP 240 77 - 79 88 - 86  Wt. (Lbs) 207 184 184.6 179 185 183.4 183  BMI 37.86 33.65 33.76 32.74 33.84 33.54 33.47       Obesity (BMI 30.0-34.9)  Patient re-educated about  the importance of commitment to a  minimum of 150 minutes of exercise per week as able.  The importance of healthy food choices with portion control discussed, as well as eating regularly and within a 12 hour window most days. The need to choose "clean , green" food 50 to 75% of the time is discussed, as well as to make water the primary drink and set a goal of 64 ounces water daily.    Weight /BMI 04/02/2020 02/22/2019 07/29/2018  WEIGHT 207 lb 184 lb 184 lb 9.6 oz  HEIGHT 5\' 2"  5\' 2"  5\' 2"   BMI 37.86 kg/m2 33.65 kg/m2 33.76 kg/m2      Hyperlipidemia Hyperlipidemia:Low fat diet discussed and encouraged.   Lipid Panel  Lab Results  Component Value Date   CHOL 234 (H) 02/20/2018   HDL 82 02/20/2018   LDLCALC 129 (H) 02/20/2018   TRIG 113 02/20/2018   CHOLHDL 2.9 02/20/2018   Updated lab needed at/ before next visit.     GERD Controlled, no change in medication   Prediabetes Patient educated about the importance of limiting  Carbohydrate  intake , the need to commit to daily physical activity for a minimum of 30 minutes , and to commit weight loss. The fact that changes in all these areas will reduce or eliminate all together the development of diabetes is stressed.   Diabetic Labs Latest Ref Rng & Units 04/02/2020 02/20/2018 09/17/2017 05/05/2017 05/04/2017  HbA1c 4.0 - 5.6 % 6.1(A) - 5.9(H) - 6.2(H)  Chol 100 - 199 mg/dL - 234(H) 169 - -  HDL >39 mg/dL - 82 62 - -  Calc LDL 0 - 99 mg/dL - 129(H) 87 - -  Triglycerides 0 - 149 mg/dL - 113 101 - -  Creatinine 0.57 - 1.00 mg/dL - 0.64 0.68 0.64 0.59   BP/Weight 04/02/2020  02/22/2019 07/29/2018 06/14/2018 05/18/2018 04/27/2018 37/01/4326  Systolic BP 614 709 - 295 747 - 340  Diastolic BP 370 77 - 79 88 - 86  Wt. (Lbs) 207 184 184.6 179 185 183.4 183  BMI 37.86 33.65 33.76 32.74 33.84 33.54 33.47   Foot/eye exam completion dates 01/27/2017  Foot Form Completion Done  deteriorated    Depression, major, single episode, in partial remission (HCC) Controlled, no change in medication

## 2020-04-02 NOTE — Patient Instructions (Addendum)
Follow-up in office with MD in 4 to 5 weeks call if you need me sooner.  Dose increase in blood pressure medication from half of a tablet once daily to 1 tablet once daily your blood pressure is high.  Flu vaccine today.and glycohB in office  Please increase vegetable intake decrease snacks and unhealthy foods and reduce portion size to help with weight loss as well as commit to regular exercise with a target of 30 minutes 5 days/week.  Your glycohemoglobin shows that your blood sugar has increased you are still prediabetic however and I know you can by change in food choices again lower your hemoglobin A1c.  Nurse  please add lipid panel TSH and vitamin D to labs if able if not she will need to have those done as well as a complete blood count.AND CHEM 7 AND egfr 3 DAYS BEFORE FOLLOW UP  Form to be returned to patient for her hrealth maintenance   Calorie Counting for Weight Loss Calories are units of energy. Your body needs a certain amount of calories from food to keep you going throughout the day. When you eat more calories than your body needs, your body stores the extra calories as fat. When you eat fewer calories than your body needs, your body burns fat to get the energy it needs. Calorie counting means keeping track of how many calories you eat and drink each day. Calorie counting can be helpful if you need to lose weight. If you make sure to eat fewer calories than your body needs, you should lose weight. Ask your health care provider what a healthy weight is for you. For calorie counting to work, you will need to eat the right number of calories in a day in order to lose a healthy amount of weight per week. A dietitian can help you determine how many calories you need in a day and will give you suggestions on how to reach your calorie goal.  A healthy amount of weight to lose per week is usually 1-2 lb (0.5-0.9 kg). This usually means that your daily calorie intake should be reduced by  500-750 calories.  Eating 1,200 - 1,500 calories per day can help most women lose weight.  Eating 1,500 - 1,800 calories per day can help most men lose weight. What is my plan? My goal is to have __________ calories per day. If I have this many calories per day, I should lose around __________ pounds per week. What do I need to know about calorie counting? In order to meet your daily calorie goal, you will need to:  Find out how many calories are in each food you would like to eat. Try to do this before you eat.  Decide how much of the food you plan to eat.  Write down what you ate and how many calories it had. Doing this is called keeping a food log. To successfully lose weight, it is important to balance calorie counting with a healthy lifestyle that includes regular activity. Aim for 150 minutes of moderate exercise (such as walking) or 75 minutes of vigorous exercise (such as running) each week. Where do I find calorie information?  The number of calories in a food can be found on a Nutrition Facts label. If a food does not have a Nutrition Facts label, try to look up the calories online or ask your dietitian for help. Remember that calories are listed per serving. If you choose to have more than one serving of  a food, you will have to multiply the calories per serving by the amount of servings you plan to eat. For example, the label on a package of bread might say that a serving size is 1 slice and that there are 90 calories in a serving. If you eat 1 slice, you will have eaten 90 calories. If you eat 2 slices, you will have eaten 180 calories. How do I keep a food log? Immediately after each meal, record the following information in your food log:  What you ate. Don't forget to include toppings, sauces, and other extras on the food.  How much you ate. This can be measured in cups, ounces, or number of items.  How many calories each food and drink had.  The total number of calories  in the meal. Keep your food log near you, such as in a small notebook in your pocket, or use a mobile app or website. Some programs will calculate calories for you and show you how many calories you have left for the day to meet your goal. What are some calorie counting tips?   Use your calories on foods and drinks that will fill you up and not leave you hungry: ? Some examples of foods that fill you up are nuts and nut butters, vegetables, lean proteins, and high-fiber foods like whole grains. High-fiber foods are foods with more than 5 g fiber per serving. ? Drinks such as sodas, specialty coffee drinks, alcohol, and juices have a lot of calories, yet do not fill you up.  Eat nutritious foods and avoid empty calories. Empty calories are calories you get from foods or beverages that do not have many vitamins or protein, such as candy, sweets, and soda. It is better to have a nutritious high-calorie food (such as an avocado) than a food with few nutrients (such as a bag of chips).  Know how many calories are in the foods you eat most often. This will help you calculate calorie counts faster.  Pay attention to calories in drinks. Low-calorie drinks include water and unsweetened drinks.  Pay attention to nutrition labels for "low fat" or "fat free" foods. These foods sometimes have the same amount of calories or more calories than the full fat versions. They also often have added sugar, starch, or salt, to make up for flavor that was removed with the fat.  Find a way of tracking calories that works for you. Get creative. Try different apps or programs if writing down calories does not work for you. What are some portion control tips?  Know how many calories are in a serving. This will help you know how many servings of a certain food you can have.  Use a measuring cup to measure serving sizes. You could also try weighing out portions on a kitchen scale. With time, you will be able to estimate  serving sizes for some foods.  Take some time to put servings of different foods on your favorite plates, bowls, and cups so you know what a serving looks like.  Try not to eat straight from a bag or box. Doing this can lead to overeating. Put the amount you would like to eat in a cup or on a plate to make sure you are eating the right portion.  Use smaller plates, glasses, and bowls to prevent overeating.  Try not to multitask (for example, watch TV or use your computer) while eating. If it is time to eat, sit down at a  table and enjoy your food. This will help you to know when you are full. It will also help you to be aware of what you are eating and how much you are eating. What are tips for following this plan? Reading food labels  Check the calorie count compared to the serving size. The serving size may be smaller than what you are used to eating.  Check the source of the calories. Make sure the food you are eating is high in vitamins and protein and low in saturated and trans fats. Shopping  Read nutrition labels while you shop. This will help you make healthy decisions before you decide to purchase your food.  Make a grocery list and stick to it. Cooking  Try to cook your favorite foods in a healthier way. For example, try baking instead of frying.  Use low-fat dairy products. Meal planning  Use more fruits and vegetables. Half of your plate should be fruits and vegetables.  Include lean proteins like poultry and fish. How do I count calories when eating out?  Ask for smaller portion sizes.  Consider sharing an entree and sides instead of getting your own entree.  If you get your own entree, eat only half. Ask for a box at the beginning of your meal and put the rest of your entree in it so you are not tempted to eat it.  If calories are listed on the menu, choose the lower calorie options.  Choose dishes that include vegetables, fruits, whole grains, low-fat dairy  products, and lean protein.  Choose items that are boiled, broiled, grilled, or steamed. Stay away from items that are buttered, battered, fried, or served with cream sauce. Items labeled "crispy" are usually fried, unless stated otherwise.  Choose water, low-fat milk, unsweetened iced tea, or other drinks without added sugar. If you want an alcoholic beverage, choose a lower calorie option such as a glass of wine or light beer.  Ask for dressings, sauces, and syrups on the side. These are usually high in calories, so you should limit the amount you eat.  If you want a salad, choose a garden salad and ask for grilled meats. Avoid extra toppings like bacon, cheese, or fried items. Ask for the dressing on the side, or ask for olive oil and vinegar or lemon to use as dressing.  Estimate how many servings of a food you are given. For example, a serving of cooked rice is  cup or about the size of half a baseball. Knowing serving sizes will help you be aware of how much food you are eating at restaurants. The list below tells you how big or small some common portion sizes are based on everyday objects: ? 1 oz--4 stacked dice. ? 3 oz--1 deck of cards. ? 1 tsp--1 die. ? 1 Tbsp-- a ping-pong ball. ? 2 Tbsp--1 ping-pong ball. ?  cup-- baseball. ? 1 cup--1 baseball. Summary  Calorie counting means keeping track of how many calories you eat and drink each day. If you eat fewer calories than your body needs, you should lose weight.  A healthy amount of weight to lose per week is usually 1-2 lb (0.5-0.9 kg). This usually means reducing your daily calorie intake by 500-750 calories.  The number of calories in a food can be found on a Nutrition Facts label. If a food does not have a Nutrition Facts label, try to look up the calories online or ask your dietitian for help.  Use your calories  on foods and drinks that will fill you up, and not on foods and drinks that will leave you hungry.  Use smaller  plates, glasses, and bowls to prevent overeating. This information is not intended to replace advice given to you by your health care provider. Make sure you discuss any questions you have with your health care provider. Document Revised: 01/22/2018 Document Reviewed: 04/04/2016 Elsevier Patient Education  [place].

## 2020-04-06 ENCOUNTER — Encounter: Payer: Self-pay | Admitting: Family Medicine

## 2020-04-06 DIAGNOSIS — F324 Major depressive disorder, single episode, in partial remission: Secondary | ICD-10-CM | POA: Insufficient documentation

## 2020-04-06 NOTE — Assessment & Plan Note (Signed)
  Patient re-educated about  the importance of commitment to a  minimum of 150 minutes of exercise per week as able.  The importance of healthy food choices with portion control discussed, as well as eating regularly and within a 12 hour window most days. The need to choose "clean , green" food 50 to 75% of the time is discussed, as well as to make water the primary drink and set a goal of 64 ounces water daily.    Weight /BMI 04/02/2020 02/22/2019 07/29/2018  WEIGHT 207 lb 184 lb 184 lb 9.6 oz  HEIGHT 5\' 2"  5\' 2"  5\' 2"   BMI 37.86 kg/m2 33.65 kg/m2 33.76 kg/m2

## 2020-04-06 NOTE — Assessment & Plan Note (Signed)
Patient educated about the importance of limiting  Carbohydrate intake , the need to commit to daily physical activity for a minimum of 30 minutes , and to commit weight loss. The fact that changes in all these areas will reduce or eliminate all together the development of diabetes is stressed.   Diabetic Labs Latest Ref Rng & Units 04/02/2020 02/20/2018 09/17/2017 05/05/2017 05/04/2017  HbA1c 4.0 - 5.6 % 6.1(A) - 5.9(H) - 6.2(H)  Chol 100 - 199 mg/dL - 234(H) 169 - -  HDL >39 mg/dL - 82 62 - -  Calc LDL 0 - 99 mg/dL - 129(H) 87 - -  Triglycerides 0 - 149 mg/dL - 113 101 - -  Creatinine 0.57 - 1.00 mg/dL - 0.64 0.68 0.64 0.59   BP/Weight 04/02/2020 02/22/2019 07/29/2018 06/14/2018 05/18/2018 04/27/2018 88/10/4845  Systolic BP 207 218 - 288 337 - 445  Diastolic BP 146 77 - 79 88 - 86  Wt. (Lbs) 207 184 184.6 179 185 183.4 183  BMI 37.86 33.65 33.76 32.74 33.84 33.54 33.47   Foot/eye exam completion dates 01/27/2017  Foot Form Completion Done  deteriorated

## 2020-04-06 NOTE — Assessment & Plan Note (Signed)
Controlled, no change in medication  

## 2020-04-06 NOTE — Assessment & Plan Note (Signed)
Uncontrolled , dose increase in medication and close f/u DASH diet and commitment to daily physical activity for a minimum of 30 minutes discussed and encouraged, as a part of hypertension management. The importance of attaining a healthy weight is also discussed.  BP/Weight 04/02/2020 02/22/2019 07/29/2018 06/14/2018 05/18/2018 04/27/2018 63/11/8586  Systolic BP 502 774 - 128 786 - 767  Diastolic BP 209 77 - 79 88 - 86  Wt. (Lbs) 207 184 184.6 179 185 183.4 183  BMI 37.86 33.65 33.76 32.74 33.84 33.54 33.47

## 2020-04-06 NOTE — Assessment & Plan Note (Signed)
Hyperlipidemia:Low fat diet discussed and encouraged.   Lipid Panel  Lab Results  Component Value Date   CHOL 234 (H) 02/20/2018   HDL 82 02/20/2018   LDLCALC 129 (H) 02/20/2018   TRIG 113 02/20/2018   CHOLHDL 2.9 02/20/2018   Updated lab needed at/ before next visit.

## 2020-04-09 ENCOUNTER — Ambulatory Visit (HOSPITAL_COMMUNITY): Payer: 59

## 2020-04-22 ENCOUNTER — Telehealth: Payer: Self-pay | Admitting: Family Medicine

## 2020-04-22 NOTE — Telephone Encounter (Signed)
pls advise pt I have lab result from 11/11, review high total and LDL with her, result  is at your station for review, needs to lower fat intake ALOTto reduce heart disease and stroke risk Vit D is low at 21.5 , I recommend daily vit D3 2000 iU

## 2020-04-24 NOTE — Telephone Encounter (Signed)
Patient aware of results with verbal understanding.

## 2020-05-08 ENCOUNTER — Ambulatory Visit: Payer: 59 | Admitting: Family Medicine

## 2020-05-14 ENCOUNTER — Other Ambulatory Visit: Payer: Self-pay

## 2020-05-14 ENCOUNTER — Ambulatory Visit (HOSPITAL_COMMUNITY)
Admission: RE | Admit: 2020-05-14 | Discharge: 2020-05-14 | Disposition: A | Discharge status: Home / Self Care | Payer: 59 | Source: Ambulatory Visit | Attending: Family Medicine | Admitting: Family Medicine

## 2020-05-14 DIAGNOSIS — Z1231 Encounter for screening mammogram for malignant neoplasm of breast: Secondary | ICD-10-CM | POA: Insufficient documentation

## 2020-05-21 ENCOUNTER — Ambulatory Visit: Payer: 59 | Admitting: Family Medicine

## 2020-06-11 ENCOUNTER — Ambulatory Visit: Payer: 59 | Admitting: Family Medicine

## 2020-10-04 ENCOUNTER — Other Ambulatory Visit: Payer: Self-pay | Admitting: Family Medicine

## 2020-11-07 LAB — HM DEXA SCAN: HM Dexa Scan: NORMAL

## 2020-11-29 ENCOUNTER — Encounter (HOSPITAL_COMMUNITY): Payer: Self-pay | Admitting: *Deleted

## 2021-01-04 IMAGING — MG DIGITAL SCREENING BILAT W/ TOMO W/ CAD
8 series · 8 of 24 positions shown · non-contrast
Comparison: Previous exam(s).

CLINICAL DATA: Screening.

EXAM:
DIGITAL SCREENING BILATERAL MAMMOGRAM WITH TOMO AND CAD

[R CC synth-2D]
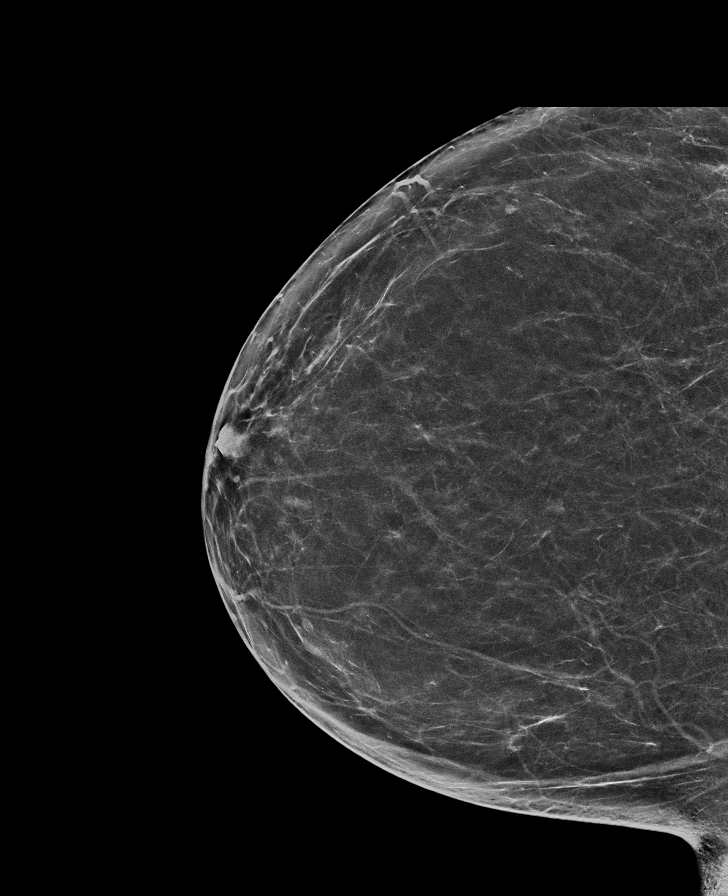

[L MLO synth-2D]
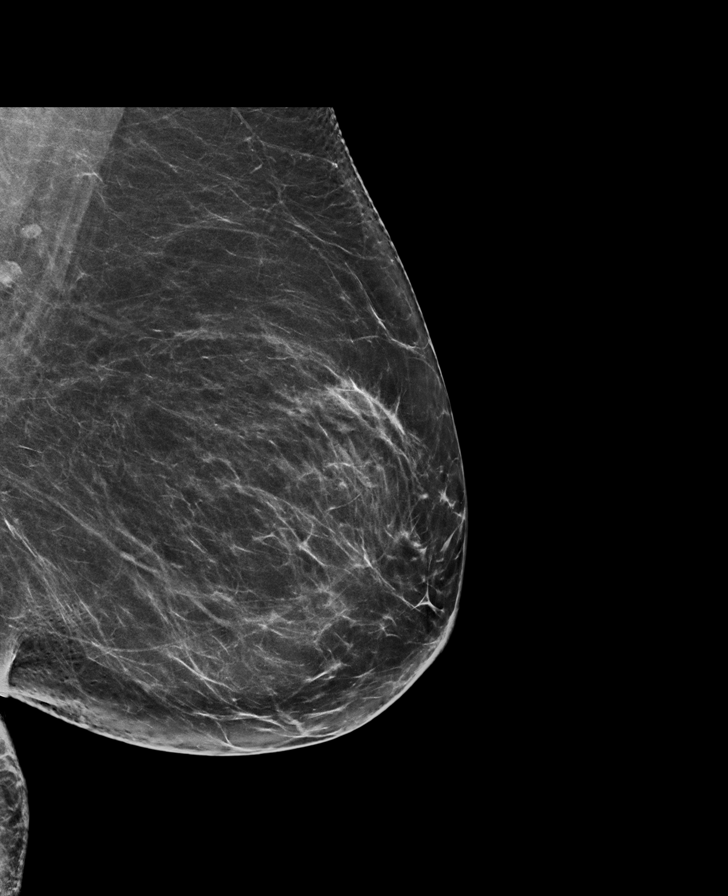

[L CC synth-2D]
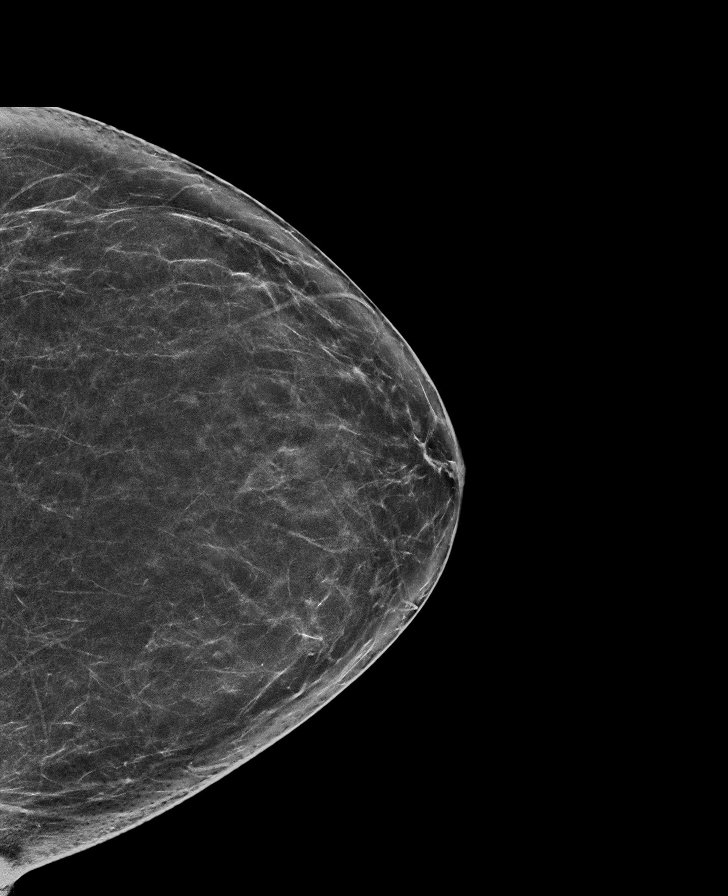

[R MLO synth-2D]
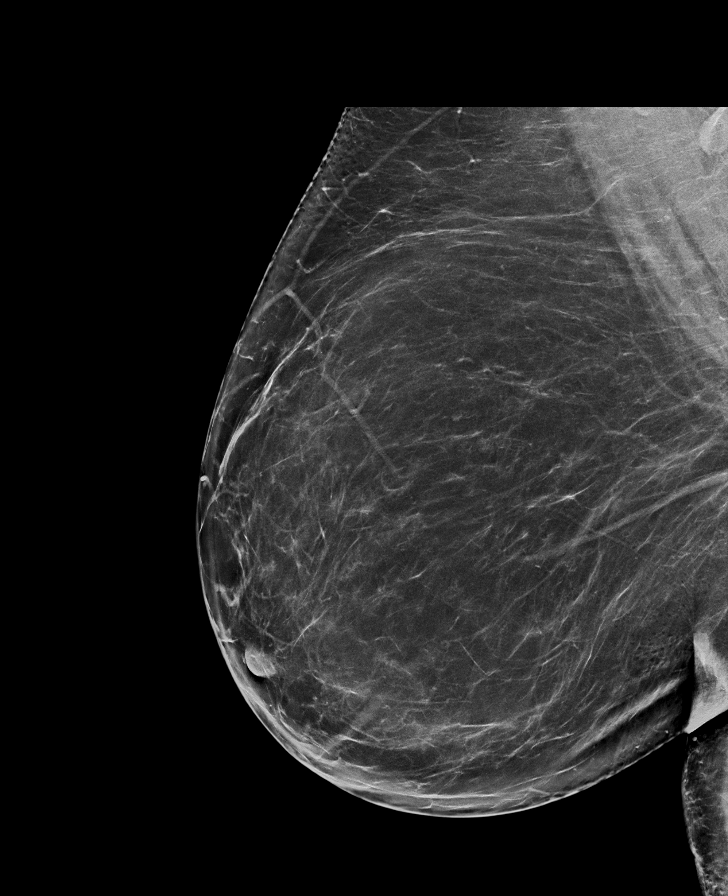

[R CC tomo · tomo slice 37/74.0]
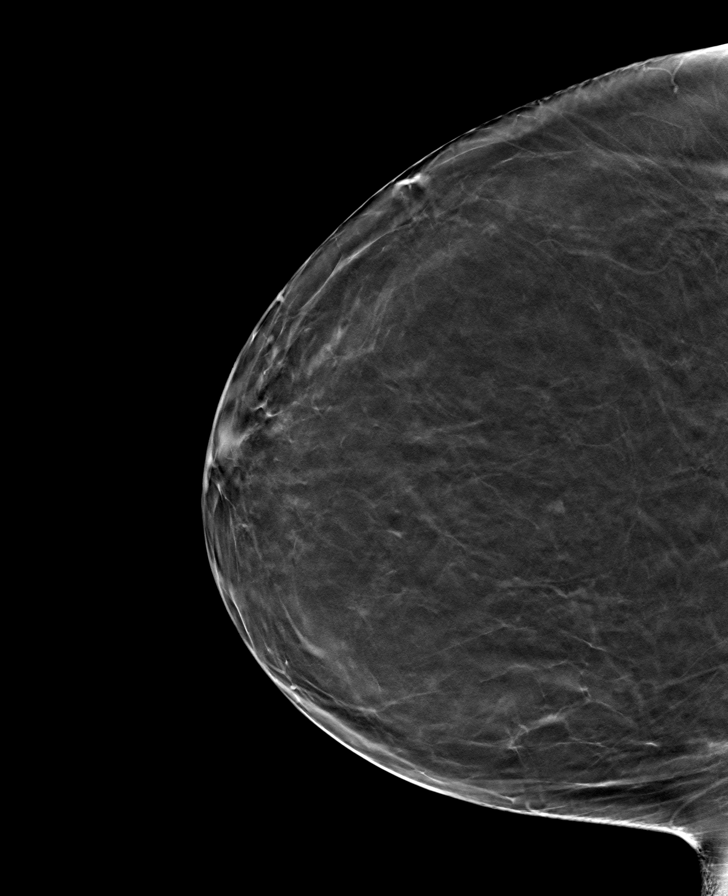

[R MLO tomo · tomo slice 43/85.0]
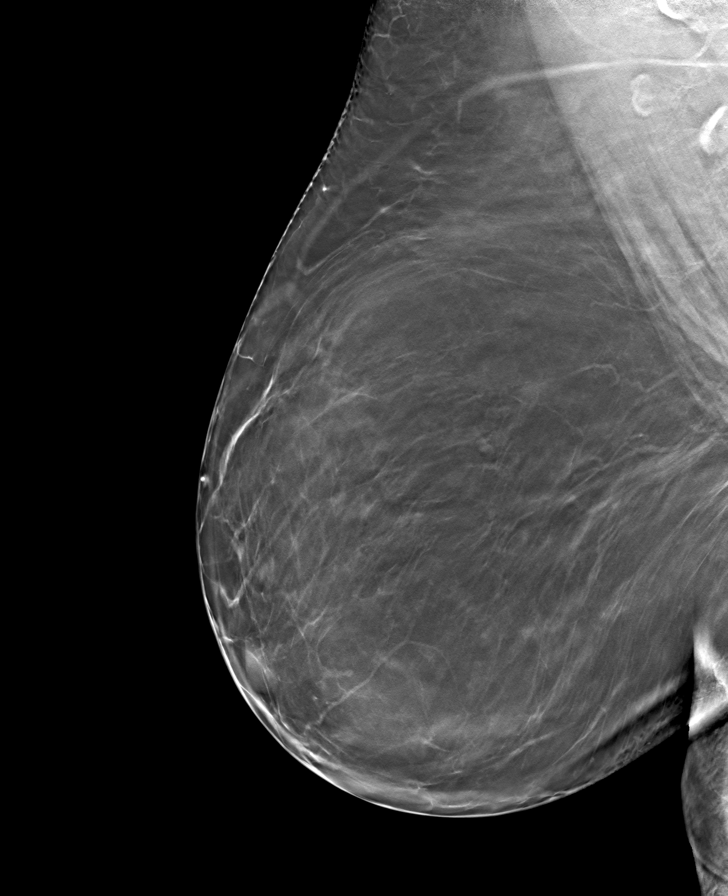

[L CC tomo · tomo slice 37/73.0]
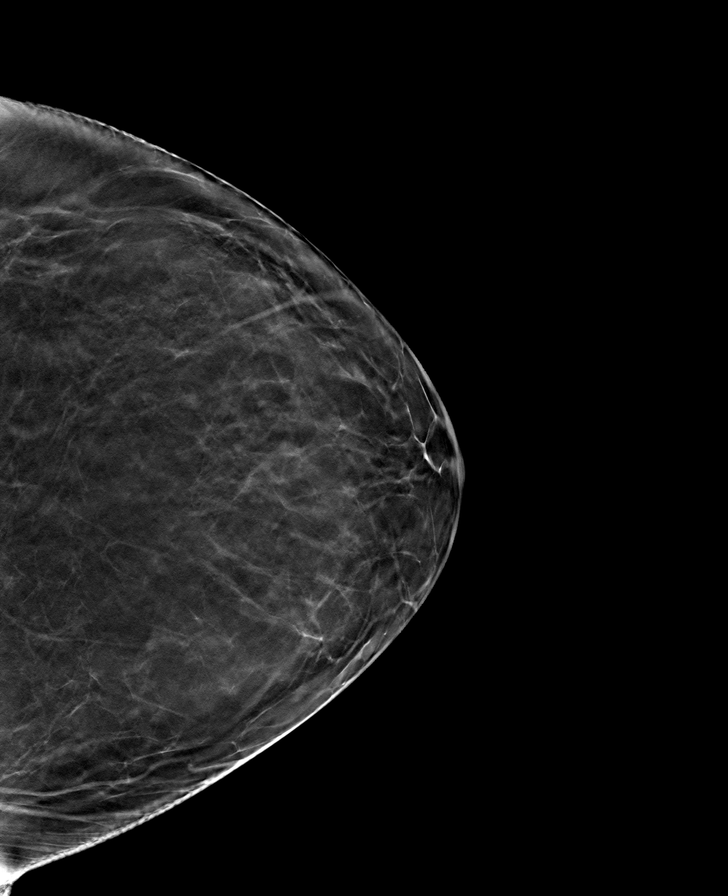

[L MLO tomo · tomo slice 40/79.0]
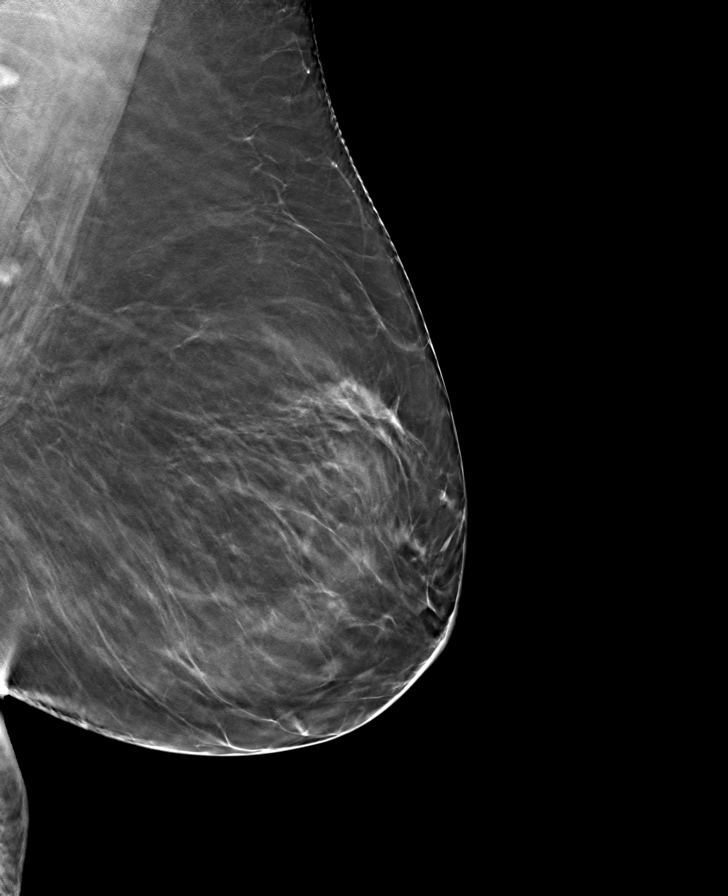

[8 of 24 positions shown; findings below may reference images not displayed]

ACR Breast Density Category b: There are scattered areas of
fibroglandular density.
FINDINGS: There are no findings suspicious for malignancy. Images were
processed with CAD.
IMPRESSION: No mammographic evidence of malignancy. A result letter of this
screening mammogram will be mailed directly to the patient.

RECOMMENDATION:
Screening mammogram in one year. (Code:[id number])

BI-RADS CATEGORY  1: Negative.

## 2021-01-05 ENCOUNTER — Other Ambulatory Visit: Payer: Self-pay | Admitting: Family Medicine

## 2021-01-11 ENCOUNTER — Telehealth: Payer: Self-pay

## 2021-01-11 NOTE — Telephone Encounter (Signed)
Patient called her husband is covid + and had the infusion yesterday at [facility] she is requesting something to take as she tested positive this morning ph# [phone]

## 2021-02-22 ENCOUNTER — Telehealth: Payer: Self-pay

## 2021-02-22 ENCOUNTER — Other Ambulatory Visit: Payer: Self-pay

## 2021-02-22 DIAGNOSIS — E559 Vitamin D deficiency, unspecified: Secondary | ICD-10-CM

## 2021-02-22 DIAGNOSIS — I10 Essential (primary) hypertension: Secondary | ICD-10-CM

## 2021-02-22 DIAGNOSIS — R7303 Prediabetes: Secondary | ICD-10-CM

## 2021-02-22 DIAGNOSIS — E782 Mixed hyperlipidemia: Secondary | ICD-10-CM

## 2021-02-22 NOTE — Telephone Encounter (Signed)
What labs does she need? Last ordered in 2020

## 2021-02-22 NOTE — Telephone Encounter (Signed)
Patient called needs blood work order, next appt 11/16 for follow up

## 2021-02-22 NOTE — Telephone Encounter (Signed)
Labs ordered.

## 2021-03-23 ENCOUNTER — Other Ambulatory Visit: Payer: Self-pay | Admitting: Family Medicine

## 2021-04-03 ENCOUNTER — Ambulatory Visit: Payer: 59 | Admitting: Family Medicine

## 2021-04-26 ENCOUNTER — Other Ambulatory Visit: Payer: Self-pay

## 2021-04-26 DIAGNOSIS — R7303 Prediabetes: Secondary | ICD-10-CM

## 2021-04-26 DIAGNOSIS — I10 Essential (primary) hypertension: Secondary | ICD-10-CM

## 2021-04-26 DIAGNOSIS — E782 Mixed hyperlipidemia: Secondary | ICD-10-CM

## 2021-04-26 DIAGNOSIS — E559 Vitamin D deficiency, unspecified: Secondary | ICD-10-CM

## 2021-04-27 LAB — LIPID PANEL
Chol/HDL Ratio: 3.5 ratio (ref 0.0–4.4)
Cholesterol, Total: 279 mg/dL — ABNORMAL HIGH (ref 100–199)
HDL: 79 mg/dL (ref >39)
LDL Chol Calc (NIH): 177 mg/dL — ABNORMAL HIGH (ref 0–99)
Triglycerides: 133 mg/dL (ref 0–149)
VLDL Cholesterol Cal: 23 mg/dL (ref 5–40)

## 2021-04-27 LAB — BMP8+EGFR
BUN/Creatinine Ratio: 21 (ref 9–23)
BUN: 12 mg/dL (ref 6–24)
CO2: 25 mmol/L (ref 20–29)
Calcium: 10.4 mg/dL — ABNORMAL HIGH (ref 8.7–10.2)
Chloride: 99 mmol/L (ref 96–106)
Creatinine, Ser: 0.58 mg/dL (ref 0.57–1.00)
Glucose: 96 mg/dL (ref 70–99)
Potassium: 3.8 mmol/L (ref 3.5–5.2)
Sodium: 137 mmol/L (ref 134–144)
eGFR: 105 mL/min/{1.73_m2} (ref >59)

## 2021-04-27 LAB — TSH: TSH: 1.7 u[IU]/mL (ref 0.450–4.500)

## 2021-04-27 LAB — VITAMIN D 25 HYDROXY (VIT D DEFICIENCY, FRACTURES): Vit D, 25-Hydroxy: 25.3 ng/mL — ABNORMAL LOW (ref 30.0–100.0)

## 2021-04-29 ENCOUNTER — Encounter: Payer: Self-pay | Admitting: Family Medicine

## 2021-04-29 ENCOUNTER — Other Ambulatory Visit: Payer: Self-pay

## 2021-04-30 LAB — HEPATIC FUNCTION PANEL
ALT: 13 IU/L (ref 0–32)
AST: 21 IU/L (ref 0–40)
Albumin: 4.4 g/dL (ref 3.8–4.9)
Alkaline Phosphatase: 81 IU/L (ref 44–121)
Bilirubin Total: 0.3 mg/dL (ref 0.0–1.2)
Bilirubin, Direct: <0.1 mg/dL (ref 0.00–0.40)
Total Protein: 6.7 g/dL (ref 6.0–8.5)

## 2021-04-30 LAB — SPECIMEN STATUS REPORT

## 2021-05-01 ENCOUNTER — Encounter: Payer: Self-pay | Admitting: Family Medicine

## 2021-05-01 ENCOUNTER — Other Ambulatory Visit: Payer: Self-pay

## 2021-05-01 ENCOUNTER — Ambulatory Visit (INDEPENDENT_AMBULATORY_CARE_PROVIDER_SITE_OTHER): Payer: 59 | Admitting: Family Medicine

## 2021-05-01 VITALS — BP 139/82 | HR 75 | Resp 16 | Ht 62.0 in | Wt 212.0 lb

## 2021-05-01 DIAGNOSIS — E782 Mixed hyperlipidemia: Secondary | ICD-10-CM

## 2021-05-01 DIAGNOSIS — F324 Major depressive disorder, single episode, in partial remission: Secondary | ICD-10-CM | POA: Diagnosis not present

## 2021-05-01 DIAGNOSIS — Z23 Encounter for immunization: Secondary | ICD-10-CM | POA: Diagnosis not present

## 2021-05-01 DIAGNOSIS — K219 Gastro-esophageal reflux disease without esophagitis: Secondary | ICD-10-CM | POA: Diagnosis not present

## 2021-05-01 DIAGNOSIS — E669 Obesity, unspecified: Secondary | ICD-10-CM

## 2021-05-01 DIAGNOSIS — R7303 Prediabetes: Secondary | ICD-10-CM | POA: Diagnosis not present

## 2021-05-01 DIAGNOSIS — I10 Essential (primary) hypertension: Secondary | ICD-10-CM | POA: Diagnosis not present

## 2021-05-01 LAB — POCT GLYCOSYLATED HEMOGLOBIN (HGB A1C): HbA1c, POC (prediabetic range): 5.6 % — AB (ref 5.7–6.4)

## 2021-05-01 NOTE — Patient Instructions (Addendum)
F/IU mid June, call if you need me sooner  Nurse visit for shingrix #2 in 2 to 3 months  Fasting lipid, chem 7 and EGFR, and CBC in early June  Please reduce fast food, fried food, cheese, butter,nuts oils , egg yolk  Increase vegetables  Congrats sugar    is normal  Cholesterol has improved but work tio be done  Kidney and liver and thyroid functions are norma   Commit to Vit D3, 2000 IU every day (OTC< oK to take two 10000 IU tabs/ capsules)  Need covid booster  Please sched mammogram and annual  exam with Gyne  Shingrix # 1 today  It is important that you exercise regularly at least 30 minutes 5 times a week. If you develop chest pain, have severe difficulty breathing, or feel very tired, stop exercising immediately and seek medical attention

## 2021-05-02 ENCOUNTER — Other Ambulatory Visit (HOSPITAL_COMMUNITY): Payer: Self-pay | Admitting: Family Medicine

## 2021-05-02 DIAGNOSIS — Z1231 Encounter for screening mammogram for malignant neoplasm of breast: Secondary | ICD-10-CM

## 2021-05-05 ENCOUNTER — Encounter: Payer: Self-pay | Admitting: Family Medicine

## 2021-05-05 NOTE — Assessment & Plan Note (Signed)
Controlled, no change in medication  

## 2021-05-05 NOTE — Assessment & Plan Note (Signed)
Controlled, no change in medication DASH diet and commitment to daily physical activity for a minimum of 30 minutes discussed and encouraged, as a part of hypertension management. The importance of attaining a healthy weight is also discussed.  BP/Weight 05/01/2021 04/02/2020 02/22/2019 07/29/2018 06/14/2018 05/18/2018 52/84/1324  Systolic BP 401 027 253 - 664 403 -  Diastolic BP 82 474 77 - 79 88 -  Wt. (Lbs) 212 207 184 184.6 179 185 183.4  BMI 38.78 37.86 33.65 33.76 32.74 33.84 33.54

## 2021-05-05 NOTE — Progress Notes (Signed)
Caitlin Sampson     MRN: [id number]      DOB: 1960/07/25   HPI Caitlin Sampson is here for follow up and re-evaluation of chronic medical conditions, medication management and review of any available recent lab and radiology data.  Preventive health is updated, specifically  Cancer screening and Immunization.   Questions or concerns regarding consultations or procedures which the PT has had in the interim are  addressed. The PT denies any adverse reactions to current medications since the last visit.  There are no new concerns.  There are no specific complaints   ROS Denies recent fever or chills. Denies sinus pressure, nasal congestion, ear pain or sore throat. Denies chest congestion, productive cough or wheezing. Denies chest pains, palpitations and leg swelling Denies abdominal pain, nausea, vomiting,diarrhea or constipation.   Denies dysuria, frequency, hesitancy or incontinence. Denies joint pain, swelling and limitation in mobility. Denies headaches, seizures, numbness, or tingling. Denies depression, anxiety or insomnia. Denies skin break down or rash.   PE  BP 139/82    Pulse 75    Resp 16    Ht 5\' 2"  (1.575 m)    Wt 212 lb (96.2 kg)    SpO2 97%    BMI 38.78 kg/m   Patient alert and oriented and in no cardiopulmonary distress.  HEENT: No facial asymmetry, EOMI,     Neck supple .  Chest: Clear to auscultation bilaterally.  CVS: S1, S2 no murmurs, no S3.Regular rate.  ABD: Soft non tender.   Ext: No edema  MS: Adequate ROM spine, shoulders, hips and knees.  Skin: Intact, no ulcerations or rash noted.  Psych: Good eye contact, normal affect. Memory intact not anxious or depressed appearing.  CNS: CN 2-12 intact, power,  normal throughout.no focal deficits noted.   Assessment & Plan  Depression, major, single episode, in partial remission (HCC) Controlled, no change in medication   Essential hypertension Controlled, no change in medication DASH diet and  commitment to daily physical activity for a minimum of 30 minutes discussed and encouraged, as a part of hypertension management. The importance of attaining a healthy weight is also discussed.  BP/Weight 05/01/2021 04/02/2020 02/22/2019 07/29/2018 06/14/2018 05/18/2018 22/63/3354  Systolic BP 562 563 893 - 734 287 -  Diastolic BP 82 681 77 - 79 88 -  Wt. (Lbs) 212 207 184 184.6 179 185 183.4  BMI 38.78 37.86 33.65 33.76 32.74 33.84 33.54       Hyperlipidemia Hyperlipidemia:Low fat diet discussed and encouraged.   Lipid Panel  Lab Results  Component Value Date   CHOL 279 (H) 04/26/2021   HDL 79 04/26/2021   LDLCALC 177 (H) 04/26/2021   TRIG 133 04/26/2021   CHOLHDL 3.5 04/26/2021       Obesity (BMI 30.0-34.9)  Patient re-educated about  the importance of commitment to a  minimum of 150 minutes of exercise per week as able.  The importance of healthy food choices with portion control discussed, as well as eating regularly and within a 12 hour window most days. The need to choose "clean , green" food 50 to 75% of the time is discussed, as well as to make water the primary drink and set a goal of 64 ounces water daily.    Weight /BMI 05/01/2021 04/02/2020 02/22/2019  WEIGHT 212 lb 207 lb 184 lb  HEIGHT 5\' 2"  5\' 2"  5\' 2"   BMI 38.78 kg/m2 37.86 kg/m2 33.65 kg/m2      Prediabetes Patient educated about the  importance of limiting  Carbohydrate intake , the need to commit to daily physical activity for a minimum of 30 minutes , and to commit weight loss. The fact that changes in all these areas will reduce or eliminate all together the development of diabetes is stressed.  Improved  Diabetic Labs Latest Ref Rng & Units 05/01/2021 04/26/2021 04/02/2020 02/20/2018 09/17/2017  HbA1c 5.7 - 6.4 % 5.6(A) - 6.1(A) - 5.9(H)  Chol 100 - 199 mg/dL - 279(H) - 234(H) 169  HDL >39 mg/dL - 79 - 82 62  Calc LDL 0 - 99 mg/dL - 177(H) - 129(H) 87  Triglycerides 0 - 149 mg/dL - 133 - 113 101   Creatinine 0.57 - 1.00 mg/dL - 0.58 - 0.64 0.68   BP/Weight 05/01/2021 04/02/2020 02/22/2019 07/29/2018 06/14/2018 05/18/2018 14/02/3012  Systolic BP 143 888 757 - 972 820 -  Diastolic BP 82 601 77 - 79 88 -  Wt. (Lbs) 212 207 184 184.6 179 185 183.4  BMI 38.78 37.86 33.65 33.76 32.74 33.84 33.54   Foot/eye exam completion dates 01/27/2017  Foot Form Completion Done      GERD Controlled, no change in medication

## 2021-05-05 NOTE — Assessment & Plan Note (Signed)
Patient educated about the importance of limiting  Carbohydrate intake , the need to commit to daily physical activity for a minimum of 30 minutes , and to commit weight loss. The fact that changes in all these areas will reduce or eliminate all together the development of diabetes is stressed.  Improved  Diabetic Labs Latest Ref Rng & Units 05/01/2021 04/26/2021 04/02/2020 02/20/2018 09/17/2017  HbA1c 5.7 - 6.4 % 5.6(A) - 6.1(A) - 5.9(H)  Chol 100 - 199 mg/dL - 279(H) - 234(H) 169  HDL >39 mg/dL - 79 - 82 62  Calc LDL 0 - 99 mg/dL - 177(H) - 129(H) 87  Triglycerides 0 - 149 mg/dL - 133 - 113 101  Creatinine 0.57 - 1.00 mg/dL - 0.58 - 0.64 0.68   BP/Weight 05/01/2021 04/02/2020 02/22/2019 07/29/2018 06/14/2018 05/18/2018 18/40/3754  Systolic BP 360 677 034 - 035 248 -  Diastolic BP 82 185 77 - 79 88 -  Wt. (Lbs) 212 207 184 184.6 179 185 183.4  BMI 38.78 37.86 33.65 33.76 32.74 33.84 33.54   Foot/eye exam completion dates 01/27/2017  Foot Form Completion Done

## 2021-05-05 NOTE — Assessment & Plan Note (Signed)
°  Patient re-educated about  the importance of commitment to a  minimum of 150 minutes of exercise per week as able.  The importance of healthy food choices with portion control discussed, as well as eating regularly and within a 12 hour window most days. The need to choose "clean , green" food 50 to 75% of the time is discussed, as well as to make water the primary drink and set a goal of 64 ounces water daily.    Weight /BMI 05/01/2021 04/02/2020 02/22/2019  WEIGHT 212 lb 207 lb 184 lb  HEIGHT 5\' 2"  5\' 2"  5\' 2"   BMI 38.78 kg/m2 37.86 kg/m2 33.65 kg/m2

## 2021-05-05 NOTE — Assessment & Plan Note (Signed)
Hyperlipidemia:Low fat diet discussed and encouraged.   Lipid Panel  Lab Results  Component Value Date   CHOL 279 (H) 04/26/2021   HDL 79 04/26/2021   LDLCALC 177 (H) 04/26/2021   TRIG 133 04/26/2021   CHOLHDL 3.5 04/26/2021

## 2021-05-27 ENCOUNTER — Other Ambulatory Visit: Payer: Self-pay

## 2021-05-27 ENCOUNTER — Ambulatory Visit (HOSPITAL_COMMUNITY)
Admission: RE | Admit: 2021-05-27 | Discharge: 2021-05-27 | Disposition: A | Discharge status: Home / Self Care | Payer: 59 | Source: Ambulatory Visit | Attending: Family Medicine | Admitting: Family Medicine

## 2021-05-27 DIAGNOSIS — Z1231 Encounter for screening mammogram for malignant neoplasm of breast: Secondary | ICD-10-CM | POA: Diagnosis present

## 2021-07-03 ENCOUNTER — Other Ambulatory Visit: Payer: Self-pay | Admitting: Family Medicine

## 2021-07-31 ENCOUNTER — Ambulatory Visit: Payer: 59

## 2021-09-10 ENCOUNTER — Ambulatory Visit: Payer: 59

## 2021-09-10 LAB — HM DIABETES EYE EXAM

## 2021-09-14 ENCOUNTER — Other Ambulatory Visit: Payer: Self-pay | Admitting: Family Medicine

## 2021-10-26 LAB — LIPID PANEL
Chol/HDL Ratio: 3.7 ratio (ref 0.0–4.4)
Cholesterol, Total: 256 mg/dL — ABNORMAL HIGH (ref 100–199)
HDL: 69 mg/dL (ref >39)
LDL Chol Calc (NIH): 149 mg/dL — ABNORMAL HIGH (ref 0–99)
Triglycerides: 212 mg/dL — ABNORMAL HIGH (ref 0–149)
VLDL Cholesterol Cal: 38 mg/dL (ref 5–40)

## 2021-10-26 LAB — BMP8+EGFR
BUN/Creatinine Ratio: 18 (ref 9–23)
BUN: 10 mg/dL (ref 6–24)
CO2: 27 mmol/L (ref 20–29)
Calcium: 10.2 mg/dL (ref 8.7–10.2)
Chloride: 96 mmol/L (ref 96–106)
Creatinine, Ser: 0.57 mg/dL (ref 0.57–1.00)
Glucose: 94 mg/dL (ref 70–99)
Potassium: 3.7 mmol/L (ref 3.5–5.2)
Sodium: 137 mmol/L (ref 134–144)
eGFR: 105 mL/min/{1.73_m2} (ref >59)

## 2021-10-26 LAB — CBC
Hematocrit: 38.5 % (ref 34.0–46.6)
Hemoglobin: 12.6 g/dL (ref 11.1–15.9)
MCH: 29.2 pg (ref 26.6–33.0)
MCHC: 32.7 g/dL (ref 31.5–35.7)
MCV: 89 fL (ref 79–97)
Platelets: 345 10*3/uL (ref 150–450)
RBC: 4.31 x10E6/uL (ref 3.77–5.28)
RDW: 13.2 % (ref 11.7–15.4)
WBC: 6.8 10*3/uL (ref 3.4–10.8)

## 2021-10-28 ENCOUNTER — Other Ambulatory Visit: Payer: Self-pay

## 2021-10-28 DIAGNOSIS — R7303 Prediabetes: Secondary | ICD-10-CM

## 2021-10-31 ENCOUNTER — Ambulatory Visit: Payer: 59 | Admitting: Family Medicine

## 2021-10-31 ENCOUNTER — Encounter: Payer: Self-pay | Admitting: Family Medicine

## 2021-10-31 VITALS — BP 148/82 | HR 94 | Resp 16 | Ht 62.0 in | Wt 216.0 lb

## 2021-10-31 DIAGNOSIS — E782 Mixed hyperlipidemia: Secondary | ICD-10-CM

## 2021-10-31 DIAGNOSIS — R7303 Prediabetes: Secondary | ICD-10-CM

## 2021-10-31 DIAGNOSIS — F324 Major depressive disorder, single episode, in partial remission: Secondary | ICD-10-CM

## 2021-10-31 DIAGNOSIS — F411 Generalized anxiety disorder: Secondary | ICD-10-CM

## 2021-10-31 DIAGNOSIS — I10 Essential (primary) hypertension: Secondary | ICD-10-CM | POA: Diagnosis not present

## 2021-10-31 DIAGNOSIS — K219 Gastro-esophageal reflux disease without esophagitis: Secondary | ICD-10-CM

## 2021-10-31 MED ORDER — LOSARTAN POTASSIUM-HCTZ 100-25 MG PO TABS: 1.0000 | ORAL_TABLET | Freq: Every day | ORAL | 1 refills | Status: DC

## 2021-10-31 MED ORDER — FENOFIBRATE 48 MG PO TABS: 48.0000 mg | ORAL_TABLET | Freq: Every day | ORAL | 1 refills | Status: DC

## 2021-10-31 NOTE — Progress Notes (Unsigned)
Caitlin Sampson     MRN: [id number]      DOB: 06/08/1960   HPI Caitlin Sampson is here for follow up and re-evaluation of chronic medical conditions, medication management and review of any available recent lab and radiology data.  Preventive health is updated, specifically  Cancer screening and Immunization.   Questions or concerns regarding consultations or procedures which the PT has had in the interim are  addressed. The PT denies any adverse reactions to current medications since the last visit.  Notes intermittent palpitations in past several months, no associated SOB, not of long duration, she attributes this to increased stress and weigth gain  ROS Denies recent fever or chills. Denies sinus pressure, nasal congestion, ear pain or sore throat. Denies chest congestion, productive cough or wheezing. Denies chest pains, PND, orthopnea and leg swelling Denies abdominal pain, nausea, vomiting,diarrhea or constipation.   Denies dysuria, frequency, hesitancy or incontinence. Denies joint pain, swelling and limitation in mobility. Denies headaches, seizures, numbness, or tingling. Increased stress , anxiety and depression, due to illness and loss in family, very worried about her parents who are both very ill. Denies skin break down or rash.   PE  BP (!) 148/82   Pulse 94   Resp 16   Ht '5\' 2"'$  (1.575 m)   Wt 216 lb (98 kg)   SpO2 94%   BMI 39.51 kg/m   Patient alert and oriented and in no cardiopulmonary distress.  HEENT: No facial asymmetry, EOMI,     Neck supple .  Chest: Clear to auscultation bilaterally.  CVS: S1, S2 no murmurs, no S3.Regular rate.  ABD: Soft non tender.   Ext: No edema  MS: Adequate ROM spine, shoulders, hips and knees.  Skin: Intact, no ulcerations or rash noted.  Psych: Good eye contact, normal affect. Memory intact not anxious or depressed appearing.  CNS: CN 2-12 intact, power,  normal throughout.no focal deficits noted.   Assessment &  Plan  Essential hypertension Uncontrolled , increase med dose DASH diet and commitment to daily physical activity for a minimum of 30 minutes discussed and encouraged, as a part of hypertension management. The importance of attaining a healthy weight is also discussed.     10/31/2021    3:19 PM 05/01/2021    2:50 PM 04/02/2020    3:37 PM 04/02/2020    3:00 PM 02/22/2019   10:42 AM 07/29/2018    5:13 PM 06/14/2018    8:06 AM  BP/Weight  Systolic BP 726 203 559 741 638  453  Diastolic BP 82 82 646 94 77  79  Wt. (Lbs) 216 212  207 184 184.6   BMI 39.51 kg/m2 38.78 kg/m2  37.86 kg/m2 33.65 kg/m2 33.76 kg/m2        Hyperlipidemia Hyperlipidemia:Low fat diet discussed and encouraged.   Lipid Panel  Lab Results  Component Value Date   CHOL 256 (H) 10/25/2021   HDL 69 10/25/2021   LDLCALC 149 (H) 10/25/2021   TRIG 212 (H) 10/25/2021   CHOLHDL 3.7 10/25/2021     Trial of tricor, statin intolerant  Morbid obesity ([place])  Patient re-educated about  the importance of commitment to a  minimum of 150 minutes of exercise per week as able.  The importance of healthy food choices with portion control discussed, as well as eating regularly and within a 12 hour window most days. The need to choose "clean , green" food 50 to 75% of the time is discussed, as well  as to make water the primary drink and set a goal of 64 ounces water daily.       10/31/2021    3:19 PM 05/01/2021    2:50 PM 04/02/2020    3:00 PM  Weight /BMI  Weight 216 lb 212 lb 207 lb  Height '5\' 2"'$  (1.575 m) '5\' 2"'$  (1.575 m) '5\' 2"'$  (1.575 m)  BMI 39.51 kg/m2 38.78 kg/m2 37.86 kg/m2    Deteriorated but under increased stress wuill work on this  Prediabetes Patient educated about the importance of limiting  Carbohydrate intake , the need to commit to daily physical activity for a minimum of 30 minutes , and to commit weight loss. The fact that changes in all these areas will reduce or eliminate all together the  development of diabetes is stressed.  Deteriorated , will work on this     Latest Ref Rng & Units 10/25/2021    8:22 AM 05/01/2021    3:44 PM 04/26/2021    8:20 AM 04/02/2020    4:29 PM 02/20/2018    8:31 AM  Diabetic Labs  HbA1c 4.8 - 5.6 % 6.1  5.6   6.1    Chol 100 - 199 mg/dL 256   279   234   HDL >39 mg/dL 69   79   82   Calc LDL 0 - 99 mg/dL 149   177   129   Triglycerides 0 - 149 mg/dL 212   133   113   Creatinine 0.57 - 1.00 mg/dL 0.57   0.58   0.64       10/31/2021    3:19 PM 05/01/2021    2:50 PM 04/02/2020    3:37 PM 04/02/2020    3:00 PM 02/22/2019   10:42 AM 07/29/2018    5:13 PM 06/14/2018    8:06 AM  BP/Weight  Systolic BP 242 683 419 622 297  989  Diastolic BP 82 82 211 94 77  79  Wt. (Lbs) 216 212  207 184 184.6   BMI 39.51 kg/m2 38.78 kg/m2  37.86 kg/m2 33.65 kg/m2 33.76 kg/m2       Latest Ref Rng & Units 09/10/2021   12:00 AM 01/27/2017    1:00 PM  Foot/eye exam completion dates  Eye Exam No Retinopathy No Retinopathy       Foot Form Completion   Done     This result is from an external source.      Depression, major, single episode, in partial remission (HCC) Controlled, no change in medication Currently increased stress due to illness in family , but coping well overall, will call for help if needed  GAD (generalized anxiety disorder) Controlled, no change in medication   GERD Controlled, no change in medication

## 2021-10-31 NOTE — Patient Instructions (Addendum)
Follow-up in 6 months call if you need me sooner.  NEW  for blood pressure is losartan HCTZ 100/25 1 tablet daily.  NEW for cholesterol and triglycerides is try Tricor ( FENOFIBRATE) 48 mg 1 daily.  Continue to work on low sugar diet high in fresh vegetables and fruit and meal prep so that you can control food choices more.  Please get fasting lipid Chem-7 and EGFR and HbA1c 1 week before your next visit.  It is important that you exercise regularly at least 30 minutes 5 times a week. If you develop chest pain, have severe difficulty breathing, or feel very tired, stop exercising immediately and seek medical attention   Thanks for choosing [facility], we consider it a privelige to serve you.    

## 2021-11-04 ENCOUNTER — Encounter: Payer: Self-pay | Admitting: Family Medicine

## 2021-11-04 NOTE — Assessment & Plan Note (Signed)
Controlled, no change in medication  

## 2021-11-04 NOTE — Assessment & Plan Note (Signed)
Hyperlipidemia:Low fat diet discussed and encouraged.   Lipid Panel  Lab Results  Component Value Date   CHOL 256 (H) 10/25/2021   HDL 69 10/25/2021   LDLCALC 149 (H) 10/25/2021   TRIG 212 (H) 10/25/2021   CHOLHDL 3.7 10/25/2021     Trial of tricor, statin intolerant

## 2021-11-04 NOTE — Assessment & Plan Note (Signed)
Uncontrolled , increase med dose DASH diet and commitment to daily physical activity for a minimum of 30 minutes discussed and encouraged, as a part of hypertension management. The importance of attaining a healthy weight is also discussed.     10/31/2021    3:19 PM 05/01/2021    2:50 PM 04/02/2020    3:37 PM 04/02/2020    3:00 PM 02/22/2019   10:42 AM 07/29/2018    5:13 PM 06/14/2018    8:06 AM  BP/Weight  Systolic BP 503 546 568 127 517  001  Diastolic BP 82 82 749 94 77  79  Wt. (Lbs) 216 212  207 184 184.6   BMI 39.51 kg/m2 38.78 kg/m2  37.86 kg/m2 33.65 kg/m2 33.76 kg/m2

## 2021-11-04 NOTE — Assessment & Plan Note (Signed)
Patient educated about the importance of limiting  Carbohydrate intake , the need to commit to daily physical activity for a minimum of 30 minutes , and to commit weight loss. The fact that changes in all these areas will reduce or eliminate all together the development of diabetes is stressed.  Deteriorated , will work on this     Latest Ref Rng & Units 10/25/2021    8:22 AM 05/01/2021    3:44 PM 04/26/2021    8:20 AM 04/02/2020    4:29 PM 02/20/2018    8:31 AM  Diabetic Labs  HbA1c 4.8 - 5.6 % 6.1  5.6   6.1    Chol 100 - 199 mg/dL 256   279   234   HDL >39 mg/dL 69   79   82   Calc LDL 0 - 99 mg/dL 149   177   129   Triglycerides 0 - 149 mg/dL 212   133   113   Creatinine 0.57 - 1.00 mg/dL 0.57   0.58   0.64       10/31/2021    3:19 PM 05/01/2021    2:50 PM 04/02/2020    3:37 PM 04/02/2020    3:00 PM 02/22/2019   10:42 AM 07/29/2018    5:13 PM 06/14/2018    8:06 AM  BP/Weight  Systolic BP 381 840 375 436 067  703  Diastolic BP 82 82 403 94 77  79  Wt. (Lbs) 216 212  207 184 184.6   BMI 39.51 kg/m2 38.78 kg/m2  37.86 kg/m2 33.65 kg/m2 33.76 kg/m2       Latest Ref Rng & Units 09/10/2021   12:00 AM 01/27/2017    1:00 PM  Foot/eye exam completion dates  Eye Exam No Retinopathy No Retinopathy       Foot Form Completion   Done     This result is from an external source.

## 2021-11-04 NOTE — Assessment & Plan Note (Signed)
  Patient re-educated about  the importance of commitment to a  minimum of 150 minutes of exercise per week as able.  The importance of healthy food choices with portion control discussed, as well as eating regularly and within a 12 hour window most days. The need to choose "clean , green" food 50 to 75% of the time is discussed, as well as to make water the primary drink and set a goal of 64 ounces water daily.       10/31/2021    3:19 PM 05/01/2021    2:50 PM 04/02/2020    3:00 PM  Weight /BMI  Weight 216 lb 212 lb 207 lb  Height '5\' 2"'$  (1.575 m) '5\' 2"'$  (1.575 m) '5\' 2"'$  (1.575 m)  BMI 39.51 kg/m2 38.78 kg/m2 37.86 kg/m2    Deteriorated but under increased stress wuill work on this

## 2021-11-04 NOTE — Assessment & Plan Note (Signed)
Controlled, no change in medication Currently increased stress due to illness in family , but coping well overall, will call for help if needed

## 2021-11-20 LAB — HEMOGLOBIN A1C
Est. average glucose Bld gHb Est-mCnc: 128 mg/dL
Hgb A1c MFr Bld: 6.1 % — ABNORMAL HIGH (ref 4.8–5.6)

## 2021-11-20 LAB — HEPATIC FUNCTION PANEL
ALT: 13 IU/L (ref 0–32)
AST: 15 IU/L (ref 0–40)
Albumin: 4.3 g/dL (ref 3.8–4.9)
Alkaline Phosphatase: 82 IU/L (ref 44–121)
Bilirubin Total: <0.2 mg/dL (ref 0.0–1.2)
Bilirubin, Direct: <0.1 mg/dL (ref 0.00–0.40)
Total Protein: 6.4 g/dL (ref 6.0–8.5)

## 2021-11-20 LAB — SPECIMEN STATUS REPORT

## 2021-11-21 ENCOUNTER — Other Ambulatory Visit: Payer: Self-pay | Admitting: Family Medicine

## 2021-11-21 MED ORDER — ROSUVASTATIN CALCIUM 20 MG PO TABS: 20.0000 mg | ORAL_TABLET | Freq: Every day | ORAL | 3 refills | Status: DC

## 2021-11-21 NOTE — Progress Notes (Signed)
Crestor 20

## 2021-12-05 ENCOUNTER — Encounter (HOSPITAL_COMMUNITY): Payer: Self-pay | Admitting: *Deleted

## 2021-12-21 ENCOUNTER — Other Ambulatory Visit: Payer: Self-pay | Admitting: Family Medicine

## 2022-02-11 DIAGNOSIS — N3000 Acute cystitis without hematuria: Secondary | ICD-10-CM | POA: Diagnosis not present

## 2022-02-21 DIAGNOSIS — Z1272 Encounter for screening for malignant neoplasm of vagina: Secondary | ICD-10-CM | POA: Diagnosis not present

## 2022-02-21 DIAGNOSIS — Z6839 Body mass index (BMI) 39.0-39.9, adult: Secondary | ICD-10-CM | POA: Diagnosis not present

## 2022-02-21 DIAGNOSIS — N952 Postmenopausal atrophic vaginitis: Secondary | ICD-10-CM | POA: Diagnosis not present

## 2022-02-21 DIAGNOSIS — Z01419 Encounter for gynecological examination (general) (routine) without abnormal findings: Secondary | ICD-10-CM | POA: Diagnosis not present

## 2022-02-21 DIAGNOSIS — R319 Hematuria, unspecified: Secondary | ICD-10-CM | POA: Diagnosis not present

## 2022-04-28 DIAGNOSIS — E782 Mixed hyperlipidemia: Secondary | ICD-10-CM | POA: Diagnosis not present

## 2022-04-28 DIAGNOSIS — R7303 Prediabetes: Secondary | ICD-10-CM | POA: Diagnosis not present

## 2022-04-29 LAB — CMP14+EGFR
ALT: 18 IU/L (ref 0–32)
AST: 18 IU/L (ref 0–40)
Albumin/Globulin Ratio: 2.3 — ABNORMAL HIGH (ref 1.2–2.2)
Albumin: 4.5 g/dL (ref 3.8–4.9)
Alkaline Phosphatase: 68 IU/L (ref 44–121)
BUN/Creatinine Ratio: 20 (ref 9–23)
BUN: 14 mg/dL (ref 6–24)
Bilirubin Total: 0.3 mg/dL (ref 0.0–1.2)
CO2: 25 mmol/L (ref 20–29)
Calcium: 10.9 mg/dL — ABNORMAL HIGH (ref 8.7–10.2)
Chloride: 100 mmol/L (ref 96–106)
Creatinine, Ser: 0.7 mg/dL (ref 0.57–1.00)
Globulin, Total: 2 g/dL (ref 1.5–4.5)
Glucose: 99 mg/dL (ref 70–99)
Potassium: 3.7 mmol/L (ref 3.5–5.2)
Sodium: 138 mmol/L (ref 134–144)
Total Protein: 6.5 g/dL (ref 6.0–8.5)
eGFR: 100 mL/min/{1.73_m2} (ref >59)

## 2022-04-29 LAB — LIPID PANEL
Chol/HDL Ratio: 2.9 ratio (ref 0.0–4.4)
Cholesterol, Total: 243 mg/dL — ABNORMAL HIGH (ref 100–199)
HDL: 83 mg/dL (ref >39)
LDL Chol Calc (NIH): 142 mg/dL — ABNORMAL HIGH (ref 0–99)
Triglycerides: 107 mg/dL (ref 0–149)
VLDL Cholesterol Cal: 18 mg/dL (ref 5–40)

## 2022-04-29 LAB — HEMOGLOBIN A1C
Est. average glucose Bld gHb Est-mCnc: 128 mg/dL
Hgb A1c MFr Bld: 6.1 % — ABNORMAL HIGH (ref 4.8–5.6)

## 2022-05-01 ENCOUNTER — Encounter: Payer: Self-pay | Admitting: Family Medicine

## 2022-05-01 ENCOUNTER — Other Ambulatory Visit: Payer: Self-pay

## 2022-05-01 ENCOUNTER — Other Ambulatory Visit (HOSPITAL_COMMUNITY): Payer: Self-pay | Admitting: Family Medicine

## 2022-05-01 ENCOUNTER — Ambulatory Visit: Payer: 59 | Admitting: Family Medicine

## 2022-05-01 VITALS — BP 140/88 | HR 79 | Ht 62.0 in | Wt 213.1 lb

## 2022-05-01 DIAGNOSIS — R051 Acute cough: Secondary | ICD-10-CM

## 2022-05-01 DIAGNOSIS — Z1231 Encounter for screening mammogram for malignant neoplasm of breast: Secondary | ICD-10-CM

## 2022-05-01 DIAGNOSIS — F324 Major depressive disorder, single episode, in partial remission: Secondary | ICD-10-CM

## 2022-05-01 DIAGNOSIS — K5730 Diverticulosis of large intestine without perforation or abscess without bleeding: Secondary | ICD-10-CM

## 2022-05-01 DIAGNOSIS — E782 Mixed hyperlipidemia: Secondary | ICD-10-CM | POA: Diagnosis not present

## 2022-05-01 DIAGNOSIS — R69 Illness, unspecified: Secondary | ICD-10-CM | POA: Diagnosis not present

## 2022-05-01 DIAGNOSIS — D3A026 Benign carcinoid tumor of the rectum: Secondary | ICD-10-CM | POA: Diagnosis not present

## 2022-05-01 DIAGNOSIS — I10 Essential (primary) hypertension: Secondary | ICD-10-CM

## 2022-05-01 DIAGNOSIS — R7303 Prediabetes: Secondary | ICD-10-CM | POA: Diagnosis not present

## 2022-05-01 DIAGNOSIS — K5790 Diverticulosis of intestine, part unspecified, without perforation or abscess without bleeding: Secondary | ICD-10-CM

## 2022-05-01 MED ORDER — PANTOPRAZOLE SODIUM 20 MG PO TBEC: 20.0000 mg | DELAYED_RELEASE_TABLET | Freq: Every day | ORAL | 1 refills | Status: DC

## 2022-05-01 MED ORDER — FENOFIBRATE 48 MG PO TABS: 48.0000 mg | ORAL_TABLET | Freq: Every day | ORAL | 1 refills | Status: DC

## 2022-05-01 MED ORDER — FLUOXETINE HCL 40 MG PO CAPS: 40.0000 mg | ORAL_CAPSULE | Freq: Every day | ORAL | 1 refills | Status: DC

## 2022-05-01 MED ORDER — LOSARTAN POTASSIUM-HCTZ 100-25 MG PO TABS: 1.0000 | ORAL_TABLET | Freq: Every day | ORAL | 1 refills | Status: DC

## 2022-05-01 MED ORDER — AMLODIPINE BESYLATE 5 MG PO TABS: 5.0000 mg | ORAL_TABLET | Freq: Every day | ORAL | 3 refills | Status: DC

## 2022-05-01 MED ORDER — AZITHROMYCIN 250 MG PO TABS: ORAL_TABLET | ORAL | 0 refills | Status: AC

## 2022-05-01 NOTE — Patient Instructions (Addendum)
F/u end feb re evaluate blood pressure , call if you need me sooner  Nurse please send 90 day supplies with 1 refill of current meds  New ADDITIONAL MEDICATION FOR BP IS AMLODIPINE 5 MG DAILY, CONTINUE YOUR CURRENT BP MEDICATION  PLS START IF ABLE ROSUVASTATIN 20 MG , HALF TABLET TWICE WEEKLY  AZITHROMYCIN IS PRESCRIBED FOR CURRENT COUGH WHICH IF WORSENS AND YOU DEVELOP FEVER , CHILLS , NEED TO FILL AND START IN NEXT 2 TO 3 DAYS  PLEASE GET FLU VACCINE IN NEXT 1 TO 2 WEEKS , WHEN SYMPTOMS RESOLVE  PLEASE GET COVID VACCINE  YOU AR REFERRED TO DR Pincus Sanes AS WE DISCUSSED  GREAT THAT YOU ARE EXERCISING AND ENJOYING IT, PLEASE KEEP IT UP  GREAT THAT YOU ARE GOING TO START A HEALTHY LIFESTYLE WITH CHANGE IN EATING, NOW IS THE BEST TIME  ALL THE BEST FOR SEASON AND  2024

## 2022-05-04 ENCOUNTER — Encounter: Payer: Self-pay | Admitting: Family Medicine

## 2022-05-04 DIAGNOSIS — R059 Cough, unspecified: Secondary | ICD-10-CM | POA: Insufficient documentation

## 2022-05-04 NOTE — Assessment & Plan Note (Signed)
  Patient re-educated about  the importance of commitment to a  minimum of 150 minutes of exercise per week as able.  The importance of healthy food choices with portion control discussed, as well as eating regularly and within a 12 hour window most days. The need to choose "clean , green" food 50 to 75% of the time is discussed, as well as to make water the primary drink and set a goal of 64 ounces water daily.       05/01/2022    1:40 PM 10/31/2021    3:19 PM 05/01/2021    2:50 PM  Weight /BMI  Weight 213 lb 1.9 oz 216 lb 212 lb  Height '5\' 2"'$  (1.575 m) '5\' 2"'$  (1.575 m) '5\' 2"'$  (1.575 m)  BMI 38.98 kg/m2 39.51 kg/m2 38.78 kg/m2

## 2022-05-04 NOTE — Assessment & Plan Note (Signed)
Uncontrolled add amlodipine 5 mg and re eval in  monhts DASH diet and commitment to daily physical activity for a minimum of 30 minutes discussed and encouraged, as a part of hypertension management. The importance of attaining a healthy weight is also discussed.     05/01/2022    1:45 PM 05/01/2022    1:40 PM 10/31/2021    3:19 PM 05/01/2021    2:50 PM 04/02/2020    3:37 PM 04/02/2020    3:00 PM 02/22/2019   10:42 AM  BP/Weight  Systolic BP 854 627 035 009 381 829 937  Diastolic BP 88 93 82 82 169 94 77  Wt. (Lbs)  213.12 216 212  207 184  BMI  38.98 kg/m2 39.51 kg/m2 38.78 kg/m2  37.86 kg/m2 33.65 kg/m2

## 2022-05-04 NOTE — Assessment & Plan Note (Signed)
Reports possible recent flare approximately 3 weeks ago wants to establish with GI , no current symptoms

## 2022-05-04 NOTE — Assessment & Plan Note (Signed)
Controlled, no change in medication  

## 2022-05-04 NOTE — Progress Notes (Signed)
Caitlin Sampson     MRN: [id number]      DOB: July 06, 1960   HPI Caitlin Sampson is here for follow up and re-evaluation of chronic medical conditions, medication management and review of any available recent lab and radiology data.  Preventive health is updated, specifically  Cancer screening and Immunization.   Questions or concerns regarding consultations or procedures which the PT has had in the interim are  addressed. The PT denies any adverse reactions to current medications since the last visit.  ROS Denies recent fever or chills. Denies sinus pressure, nasal congestion, ear pain or sore throat. 3 day h/o cough and chest congestion, . Denies chest pains, palpitations and leg swelling Denies abdominal pain, nausea, vomiting,diarrhea or constipation.   Denies dysuria, frequency, hesitancy or incontinence. Denies joint pain, swelling and limitation in mobility. Denies headaches, seizures, numbness, or tingling. Denies depression, anxiety or insomnia. Denies skin break down or rash.   PE  BP (!) 140/88 (BP Location: Left Arm, Patient Position: Sitting, Cuff Size: Large)   Pulse 79   Ht '5\' 2"'$  (1.575 m)   Wt 213 lb 1.9 oz (96.7 kg)   SpO2 96%   BMI 38.98 kg/m   Patient alert and oriented and in no cardiopulmonary distress.  HEENT: No facial asymmetry, EOMI,     Neck supple .  Chest: Clear to auscultation bilaterally.No crackles or wheezes heard   CVS: S1, S2 no murmurs, no S3.Regular rate.  ABD: Soft non tender.   Ext: No edema  MS: Adequate ROM spine, shoulders, hips and knees.  Skin: Intact, no ulcerations or rash noted.  Psych: Good eye contact, normal affect. Memory intact not anxious or depressed appearing.  CNS: CN 2-12 intact, power,  normal throughout.no focal deficits noted.   Assessment & Plan  Rectal carcinoid tumor Requests f/u with Dr Pincus Sanes who her spouse sees, most recent  colonoscopy 06/14/18, diverticulosis, no other abnomality  Diverticular disease  of colon Reports possible recent flare approximately 3 weeks ago wants to establish with GI , no current symptoms  Essential hypertension Uncontrolled add amlodipine 5 mg and re eval in  monhts DASH diet and commitment to daily physical activity for a minimum of 30 minutes discussed and encouraged, as a part of hypertension management. The importance of attaining a healthy weight is also discussed.     05/01/2022    1:45 PM 05/01/2022    1:40 PM 10/31/2021    3:19 PM 05/01/2021    2:50 PM 04/02/2020    3:37 PM 04/02/2020    3:00 PM 02/22/2019   10:42 AM  BP/Weight  Systolic BP 287 867 672 094 709 628 366  Diastolic BP 88 93 82 82 294 94 77  Wt. (Lbs)  213.12 216 212  207 184  BMI  38.98 kg/m2 39.51 kg/m2 38.78 kg/m2  37.86 kg/m2 33.65 kg/m2       Prediabetes Patient educated about the importance of limiting  Carbohydrate intake , the need to commit to daily physical activity for a minimum of 30 minutes , and to commit weight loss. The fact that changes in all these areas will reduce or eliminate all together the development of diabetes is stressed.      Latest Ref Rng & Units 04/28/2022    8:09 AM 10/25/2021    8:22 AM 05/01/2021    3:44 PM 04/26/2021    8:20 AM 04/02/2020    4:29 PM  Diabetic Labs  HbA1c 4.8 - 5.6 % 6.1  6.1  5.6   6.1   Chol 100 - 199 mg/dL 243  256   279    HDL >39 mg/dL 83  69   79    Calc LDL 0 - 99 mg/dL 142  149   177    Triglycerides 0 - 149 mg/dL 107  212   133    Creatinine 0.57 - 1.00 mg/dL 0.70  0.57   0.58        05/01/2022    1:45 PM 05/01/2022    1:40 PM 10/31/2021    3:19 PM 05/01/2021    2:50 PM 04/02/2020    3:37 PM 04/02/2020    3:00 PM 02/22/2019   10:42 AM  BP/Weight  Systolic BP 841 324 401 027 253 664 403  Diastolic BP 88 93 82 82 474 94 77  Wt. (Lbs)  213.12 216 212  207 184  BMI  38.98 kg/m2 39.51 kg/m2 38.78 kg/m2  37.86 kg/m2 33.65 kg/m2      Latest Ref Rng & Units 09/10/2021   12:00 AM 01/27/2017    1:00 PM  Foot/eye  exam completion dates  Eye Exam No Retinopathy No Retinopathy       Foot Form Completion   Done     This result is from an external source.    unchanged  Morbid obesity ([place])  Patient re-educated about  the importance of commitment to a  minimum of 150 minutes of exercise per week as able.  The importance of healthy food choices with portion control discussed, as well as eating regularly and within a 12 hour window most days. The need to choose "clean , green" food 50 to 75% of the time is discussed, as well as to make water the primary drink and set a goal of 64 ounces water daily.       05/01/2022    1:40 PM 10/31/2021    3:19 PM 05/01/2021    2:50 PM  Weight /BMI  Weight 213 lb 1.9 oz 216 lb 212 lb  Height '5\' 2"'$  (1.575 m) '5\' 2"'$  (1.575 m) '5\' 2"'$  (1.575 m)  BMI 38.98 kg/m2 39.51 kg/m2 38.78 kg/m2      Hyperlipidemia Hyperlipidemia:Low fat diet discussed and encouraged.   Lipid Panel  Lab Results  Component Value Date   CHOL 243 (H) 04/28/2022   HDL 83 04/28/2022   LDLCALC 142 (H) 04/28/2022   TRIG 107 04/28/2022   CHOLHDL 2.9 04/28/2022     Trial of twice weekly low dose crestor if able to tolerate  Depression, major, single episode, in partial remission (HCC) Controlled, no change in medication   Cough Advised to push fluids and take decongestant as needed, rx sent for Z pack if needed in next 3 to 5 days, hold flu vaccine x 1 to 2 weeks

## 2022-05-04 NOTE — Assessment & Plan Note (Signed)
Requests f/u with Dr Pincus Sanes who her spouse sees, most recent  colonoscopy 06/14/18, diverticulosis, no other abnomality

## 2022-05-04 NOTE — Assessment & Plan Note (Signed)
Advised to push fluids and take decongestant as needed, rx sent for Z pack if needed in next 3 to 5 days, hold flu vaccine x 1 to 2 weeks

## 2022-05-04 NOTE — Assessment & Plan Note (Signed)
Hyperlipidemia:Low fat diet discussed and encouraged.   Lipid Panel  Lab Results  Component Value Date   CHOL 243 (H) 04/28/2022   HDL 83 04/28/2022   LDLCALC 142 (H) 04/28/2022   TRIG 107 04/28/2022   CHOLHDL 2.9 04/28/2022     Trial of twice weekly low dose crestor if able to tolerate

## 2022-05-04 NOTE — Assessment & Plan Note (Signed)
Patient educated about the importance of limiting  Carbohydrate intake , the need to commit to daily physical activity for a minimum of 30 minutes , and to commit weight loss. The fact that changes in all these areas will reduce or eliminate all together the development of diabetes is stressed.      Latest Ref Rng & Units 04/28/2022    8:09 AM 10/25/2021    8:22 AM 05/01/2021    3:44 PM 04/26/2021    8:20 AM 04/02/2020    4:29 PM  Diabetic Labs  HbA1c 4.8 - 5.6 % 6.1  6.1  5.6   6.1   Chol 100 - 199 mg/dL 243  256   279    HDL >39 mg/dL 83  69   79    Calc LDL 0 - 99 mg/dL 142  149   177    Triglycerides 0 - 149 mg/dL 107  212   133    Creatinine 0.57 - 1.00 mg/dL 0.70  0.57   0.58        05/01/2022    1:45 PM 05/01/2022    1:40 PM 10/31/2021    3:19 PM 05/01/2021    2:50 PM 04/02/2020    3:37 PM 04/02/2020    3:00 PM 02/22/2019   10:42 AM  BP/Weight  Systolic BP 948 016 553 748 270 786 754  Diastolic BP 88 93 82 82 492 94 77  Wt. (Lbs)  213.12 216 212  207 184  BMI  38.98 kg/m2 39.51 kg/m2 38.78 kg/m2  37.86 kg/m2 33.65 kg/m2      Latest Ref Rng & Units 09/10/2021   12:00 AM 01/27/2017    1:00 PM  Foot/eye exam completion dates  Eye Exam No Retinopathy No Retinopathy       Foot Form Completion   Done     This result is from an external source.    unchanged

## 2022-06-02 ENCOUNTER — Ambulatory Visit (HOSPITAL_COMMUNITY)
Admission: RE | Admit: 2022-06-02 | Discharge: 2022-06-02 | Disposition: A | Discharge status: Home / Self Care | Payer: 59 | Source: Ambulatory Visit | Attending: Family Medicine | Admitting: Family Medicine

## 2022-06-02 DIAGNOSIS — Z1231 Encounter for screening mammogram for malignant neoplasm of breast: Secondary | ICD-10-CM | POA: Diagnosis not present

## 2022-06-03 ENCOUNTER — Encounter: Payer: Self-pay | Admitting: Family Medicine

## 2022-06-04 NOTE — Telephone Encounter (Signed)
Called patient and changed to nurse visit.

## 2022-06-11 ENCOUNTER — Encounter: Payer: Self-pay | Admitting: Family Medicine

## 2022-06-11 ENCOUNTER — Other Ambulatory Visit: Payer: Self-pay

## 2022-06-11 MED ORDER — ROSUVASTATIN CALCIUM 20 MG PO TABS: ORAL_TABLET | ORAL | 3 refills | Status: DC

## 2022-06-12 ENCOUNTER — Encounter: Payer: Self-pay | Admitting: Family Medicine

## 2022-06-19 ENCOUNTER — Encounter: Payer: Self-pay | Admitting: Family Medicine

## 2022-06-24 NOTE — Telephone Encounter (Signed)
Patient called today left a voicemail has been trying to get in to do Colonoscopy since December 2023 and have not heard anything. Patient has asked for nurse to return her call today [phone].

## 2022-06-26 ENCOUNTER — Encounter: Payer: Self-pay | Admitting: Gastroenterology

## 2022-07-09 ENCOUNTER — Ambulatory Visit: Payer: 59

## 2022-07-09 ENCOUNTER — Ambulatory Visit: Payer: 59 | Admitting: Family Medicine

## 2022-07-09 NOTE — Progress Notes (Signed)
Bp check after starting additional bp med

## 2022-08-01 NOTE — Progress Notes (Signed)
[facility] Consult Note:  History: Caitlin Sampson 08/05/2022  Referring provider: Fayrene Helper, MD  Reason for consult/chief complaint: No chief complaint on file.   Subjective  HPI: Patient presents to clinic today for an evaluation of a screening colonoscopy per the request of Dr. Tula Nakayama.    ***   ROS: Review of Systems   Past Medical History: Past Medical History:  Diagnosis Date   Anxiety    Carcinoid tumor    Complication of anesthesia    felt strangled when woke up   Diverticulitis    Diverticulosis of colon 2012   GERD (gastroesophageal reflux disease)    History of hiatal hernia    History of kidney stones    Hyperlipidemia    Hypertension    IDA (iron deficiency anemia)    hx of iron def.   Poison oak dermatitis 2012     Past Surgical History: Past Surgical History:  Procedure Laterality Date   ABDOMINAL HYSTERECTOMY     CARDIAC CATHETERIZATION  09/05/03   EF 57%. NORMAL CORONARY ARTERIES. NO EVIDENCE OF RENAL ARTERY STENOSIS.   CHOLECYSTECTOMY     COLONOSCOPY N/A 06/14/2018   Procedure: COLONOSCOPY;  Surgeon: Rogene Houston, MD;  [facility]: [facility];  Service: Endoscopy;  Laterality: N/A;  730   ESOPHAGOGASTRODUODENOSCOPY N/A 10/17/2015   Procedure: ESOPHAGOGASTRODUODENOSCOPY (EGD);  Surgeon: Rogene Houston, MD;  Location: [facility];  Service: Endoscopy;  Laterality: N/A;  3:00   LAPAROSCOPIC GASTRIC SLEEVE RESECTION N/A 05/05/2017   Procedure: LAPAROSCOPIC GASTRIC SLEEVE RESECTION, UPPER ENDO;  Surgeon: Johnathan Hausen, MD;  Location: [facility];  Service: General;  Laterality: N/A;   MYOCARDIAL PERFUSION STUDY  03/30/09   NORMAL. EF 76%.   sleeve gastrectomy     05-05-18   TRANSTHORACIC ECHOCARDIOGRAM  10/10/08   NORMAL. EF => 55%.     Family History: Family History  Problem Relation Age of Onset   Heart disease Mother        a fib   Kidney disease Mother    Heart disease Father        MI    Hyperlipidemia Father    Hypertension Father    Kidney disease Father    Kidney disease Brother    Diabetes Maternal Grandmother    Stroke Maternal Grandmother    Colon cancer Neg Hx     Social History: Social History   Socioeconomic History   Marital status: Married    Spouse name: Not on file   Number of children: Not on file   Years of education: Not on file   Highest education level: Not on file  Occupational History   Not on file  Tobacco Use   Smoking status: Former   Smokeless tobacco: Never   Tobacco comments:    quit smoking for over 29 yrs.   Vaping Use   Vaping Use: Never used  Substance and Sexual Activity   Alcohol use: No    Alcohol/week: 0.0 standard drinks of alcohol   Drug use: No   Sexual activity: Yes  Other Topics Concern   Not on file  Social History Narrative   Not on file   Social Determinants of Health   Financial Resource Strain: Not on file  Food Insecurity: Not on file  Transportation Needs: Not on file  Physical Activity: Not on file  Stress: Not on file  Social Connections: Not on file    Allergies: Allergies  Allergen Reactions  Ivp Dye [Iodinated Contrast Media] Itching    Outpatient Meds: Current Outpatient Medications  Medication Sig Dispense Refill   amLODipine (NORVASC) 5 MG tablet Take 1 tablet (5 mg total) by mouth daily. 30 tablet 3   aspirin EC 81 MG tablet Take 81 mg by mouth daily. Swallow whole.     fenofibrate (TRICOR) 48 MG tablet Take 1 tablet (48 mg total) by mouth daily. 90 tablet 1   FLUoxetine (PROZAC) 40 MG capsule Take 1 capsule (40 mg total) by mouth daily. 90 capsule 1   losartan-hydrochlorothiazide (HYZAAR) 100-25 MG tablet Take 1 tablet by mouth daily. 90 tablet 1   pantoprazole (PROTONIX) 20 MG tablet Take 1 tablet (20 mg total) by mouth daily. 90 tablet 1   rosuvastatin (CRESTOR) 20 MG tablet Take half tablet twice weekly 90 tablet 3   No current facility-administered medications for this visit.       ___________________________________________________________________ Objective   Exam:  There were no vitals taken for this visit. Wt Readings from Last 3 Encounters:  05/01/22 213 lb 1.9 oz (96.7 kg)  10/31/21 216 lb (98 kg)  05/01/21 212 lb (96.2 kg)    General: well-appearing ***  Eyes: sclera anicteric, no redness ENT: oral mucosa moist without lesions, no cervical or supraclavicular lymphadenopathy CV: ***, no JVD, no peripheral edema Resp: clear to auscultation bilaterally, normal RR and effort noted GI: soft, *** tenderness, with active bowel sounds. No guarding or palpable organomegaly noted. Skin; warm and dry, no rash or jaundice noted Neuro: awake, alert and oriented x 3. Normal gross motor function and fluent speech  Labs:    Latest Ref Rng & Units 10/25/2021    8:22 AM 02/20/2018    8:31 AM 05/06/2017    4:57 AM  CBC  WBC 3.4 - 10.8 x10E3/uL 6.8  6.7  13.3   Hemoglobin 11.1 - 15.9 g/dL 12.6  13.5  11.6   Hematocrit 34.0 - 46.6 % 38.5  39.5  35.6   Platelets 150 - 450 x10E3/uL 345  322  374       Latest Ref Rng & Units 04/28/2022    8:09 AM 10/25/2021    8:22 AM 04/26/2021    8:20 AM  CMP  Glucose 70 - 99 mg/dL 99  94  96   BUN 6 - 24 mg/dL 14  10  12    Creatinine 0.57 - 1.00 mg/dL 0.70  0.57  0.58   Sodium 134 - 144 mmol/L 138  137  137   Potassium 3.5 - 5.2 mmol/L 3.7  3.7  3.8   Chloride 96 - 106 mmol/L 100  96  99   CO2 20 - 29 mmol/L 25  27  25    Calcium 8.7 - 10.2 mg/dL 10.9  10.2  10.4   Total Protein 6.0 - 8.5 g/dL 6.5  6.4  6.7   Total Bilirubin 0.0 - 1.2 mg/dL 0.3  <0.2  0.3   Alkaline Phos 44 - 121 IU/L 68  82  81   AST 0 - 40 IU/L 18  15  21    ALT 0 - 32 IU/L 18  13  13      Radiologic Studies:  GI Hx: Colonoscopy 06-14-18 . - Diverticulosis in the sigmoid colon. - No specimens collected.  Assessment: No diagnosis found.  ***   Plan: ***   Thank you for the courtesy of this consult.  Please call me with any questions or  concerns.  Rutherford Limerick  CC:  Referring provider noted above   I,Safa M Kadhim,acting as a scribe for Nelida Meuse III, MD.,have documented all relevant documentation on the behalf of Doran Stabler, MD,as directed by  Doran Stabler, MD while in the presence of Doran Stabler, MD.

## 2022-08-05 ENCOUNTER — Ambulatory Visit: Payer: 59 | Admitting: Gastroenterology

## 2022-08-05 ENCOUNTER — Encounter: Payer: Self-pay | Admitting: Gastroenterology

## 2022-08-05 VITALS — BP 136/80 | HR 78 | Ht 62.0 in | Wt 222.1 lb

## 2022-08-05 DIAGNOSIS — Z8719 Personal history of other diseases of the digestive system: Secondary | ICD-10-CM | POA: Diagnosis not present

## 2022-08-05 DIAGNOSIS — R194 Change in bowel habit: Secondary | ICD-10-CM

## 2022-08-05 DIAGNOSIS — Z86012 Personal history of benign carcinoid tumor: Secondary | ICD-10-CM | POA: Diagnosis not present

## 2022-08-05 NOTE — Patient Instructions (Signed)
_______________________________________________________  If your blood pressure at your visit was 140/90 or greater, please contact your primary care physician to follow up on this.  _______________________________________________________  If you are age 60 or older, your body mass index should be between 23-30. Your Body mass index is 40.63 kg/m. If this is out of the aforementioned range listed, please consider follow up with your Primary Care Provider.  If you are age 83 or younger, your body mass index should be between 19-25. Your Body mass index is 40.63 kg/m. If this is out of the aformentioned range listed, please consider follow up with your Primary Care Provider.   ________________________________________________________  The  GI providers would like to encourage you to use [facility] to communicate with providers for non-urgent requests or questions.  Due to long hold times on the telephone, sending your provider a message by [facility] may be a faster and more efficient way to get a response.  Please allow 48 business hours for a response.  Please remember that this is for non-urgent requests.  _______________________________________________________  Caitlin Sampson will be due for a recall colonoscopy in Jan 2030. We will send you a reminder in the mail when it gets closer to that time.  It was a pleasure to see you today!  Thank you for trusting me with your gastrointestinal care!        It was a pleasure to see you today!  Thank you for trusting me with your gastrointestinal care!

## 2022-08-12 ENCOUNTER — Other Ambulatory Visit: Payer: Self-pay | Admitting: Family Medicine

## 2022-08-12 NOTE — Telephone Encounter (Signed)
Pt changing phar can you please send all meds to mail order?   [facility] fax# [phone]

## 2022-09-03 LAB — HM DIABETES EYE EXAM

## 2022-10-06 ENCOUNTER — Ambulatory Visit (INDEPENDENT_AMBULATORY_CARE_PROVIDER_SITE_OTHER): Payer: 59

## 2022-10-06 ENCOUNTER — Ambulatory Visit
Admission: RE | Admit: 2022-10-06 | Discharge: 2022-10-06 | Disposition: A | Discharge status: Home / Self Care | Payer: 59 | Source: Ambulatory Visit | Attending: Nurse Practitioner | Admitting: Nurse Practitioner

## 2022-10-06 VITALS — BP 142/86 | HR 78 | Temp 98.0°F | Resp 18

## 2022-10-06 DIAGNOSIS — J0100 Acute maxillary sinusitis, unspecified: Secondary | ICD-10-CM | POA: Diagnosis not present

## 2022-10-06 DIAGNOSIS — R0602 Shortness of breath: Secondary | ICD-10-CM | POA: Diagnosis not present

## 2022-10-06 DIAGNOSIS — R059 Cough, unspecified: Secondary | ICD-10-CM | POA: Diagnosis not present

## 2022-10-06 DIAGNOSIS — J9811 Atelectasis: Secondary | ICD-10-CM | POA: Diagnosis not present

## 2022-10-06 MED ORDER — AMOXICILLIN-POT CLAVULANATE 875-125 MG PO TABS: 1.0000 | ORAL_TABLET | Freq: Two times a day (BID) | ORAL | 0 refills | Status: DC

## 2022-10-06 MED ORDER — PREDNISONE 20 MG PO TABS: 40.0000 mg | ORAL_TABLET | Freq: Every day | ORAL | 0 refills | Status: AC

## 2022-10-06 MED ORDER — FLUTICASONE PROPIONATE 50 MCG/ACT NA SUSP: 2.0000 | Freq: Every day | NASAL | 0 refills | Status: DC

## 2022-10-06 MED ORDER — PROMETHAZINE-DM 6.25-15 MG/5ML PO SYRP: 5.0000 mL | ORAL_SOLUTION | Freq: Four times a day (QID) | ORAL | 0 refills | Status: DC | PRN

## 2022-10-06 NOTE — Discharge Instructions (Addendum)
The chest x-ray was negative for pneumonia. Take medication as prescribed. Increase fluids and allow for plenty of rest.  Make sure you are drinking at least 8-10 8 ounce glasses of water while symptoms persist. May take over-the-counter Tylenol or ibuprofen as needed for pain, fever, or general discomfort. Recommend normal saline nasal spray throughout the day to help with nasal congestion and runny nose. For your cough, it may be helpful for you to use a humidifier in your bedroom at nighttime during sleep and sleep elevated on pillows while cough symptoms persist. If symptoms do not improve with this treatment, please follow-up with your primary care physician for further evaluation. Follow-up as needed.

## 2022-10-06 NOTE — ED Provider Notes (Signed)
[facility]    CSN: [id number] Arrival date & time: 10/06/22  1231      History   Chief Complaint Chief Complaint  Patient presents with   Cough    Been sick for 2 weeks tomorrow. Can't seem to shake this congestion in chest and up - Entered by patient    HPI Caitlin Sampson is a 62 y.o. female.   The history is provided by the patient.   Patient presents for complaints of chills, cough, body aches, bilateral ear fullness, and chest congestion that is been present for the past 2 weeks.  Patient states that she was taking several over-the-counter medications, felt that she was getting better, then all of a sudden, she felt like she was "back at square 1".  Patient states that she has had a persistent cough, it is sometimes productive, and sometimes dry.  She states that cough is worse in the morning and at night.  Patient also states that she has pain in her back, and states that she also feels that she has difficulty taking a deep breath.  Patient denies fever, ear pain, ear drainage, difficulty breathing, abdominal pain, nausea, vomiting, or diarrhea.  Past Medical History:  Diagnosis Date   Anxiety    Carcinoid tumor    Complication of anesthesia    felt strangled when woke up   Diverticulitis    Diverticulosis of colon 2012   GERD (gastroesophageal reflux disease)    History of hiatal hernia    History of kidney stones    Hyperlipidemia    Hypertension    IDA (iron deficiency anemia)    hx of iron def.   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   History of rectal cancer 05/05/2018   Hypercalcemia 09/25/2017   S/P laparoscopic sleeve gastrectomy Dec 2018 05/05/2017   Rectal carcinoid tumor 08/19/2015   Internal hemorrhoids 08/19/2015   Diverticular disease of colon 08/19/2015   IDA  (iron deficiency anemia) 08/14/2015   Essential hypertension 02/22/2014   Metabolic syndrome X 11/28/2012   GAD (generalized anxiety disorder) 10/30/2011   Prediabetes 11/06/2010   Hyperlipidemia 04/29/2006   GERD 04/29/2006   IBS 04/29/2006   CALCULUS, KIDNEY 04/29/2006    Past Surgical History:  Procedure Laterality Date   ABDOMINAL HYSTERECTOMY     CARDIAC CATHETERIZATION  09/05/03   EF 57%. NORMAL CORONARY ARTERIES. NO EVIDENCE OF RENAL ARTERY STENOSIS.   CHOLECYSTECTOMY     COLONOSCOPY N/A 06/14/2018   Procedure: COLONOSCOPY;  Surgeon: Malissa Hippo, MD;  [facility];  Service: Endoscopy;  Laterality: N/A;  730   ESOPHAGOGASTRODUODENOSCOPY N/A 10/17/2015   Procedure: ESOPHAGOGASTRODUODENOSCOPY (EGD);  Surgeon: Malissa Hippo, MD;  [facility];  Service: Endoscopy;  Laterality: N/A;  3:00   LAPAROSCOPIC GASTRIC SLEEVE RESECTION N/A 05/05/2017   Procedure: LAPAROSCOPIC GASTRIC SLEEVE RESECTION, UPPER ENDO;  Surgeon: Luretha Murphy, MD;  Location: [facility];  Service: General;  Laterality: N/A;   MYOCARDIAL PERFUSION STUDY  03/30/09   NORMAL. EF 76%.   sleeve gastrectomy     05-05-18   TRANSTHORACIC ECHOCARDIOGRAM  10/10/08   NORMAL. EF => 55%.    OB History   No obstetric history on file.      Home Medications    Prior to Admission medications  Medication Sig Start Date End Date Taking? Authorizing Provider  amLODipine (NORVASC) 5 MG tablet Take 1 tablet by mouth once daily 08/12/22  Yes Kerri Perches, MD  amoxicillin-clavulanate (AUGMENTIN) 875-125 MG tablet Take 1 tablet by mouth every 12 (twelve) hours. 10/06/22  Yes Mackynzie Woolford-Warren, Sadie Haber, NP  aspirin EC 81 MG tablet Take 81 mg by mouth daily. Swallow whole.   Yes [provider]  fenofibrate (TRICOR) 48 MG tablet Take 1 tablet (48 mg total) by mouth daily. 05/01/22  Yes Kerri Perches, MD  FLUoxetine (PROZAC) 40 MG capsule Take 1 capsule (40 mg total) by mouth daily.  05/01/22  Yes Kerri Perches, MD  fluticasone (FLONASE) 50 MCG/ACT nasal spray Place 2 sprays into both nostrils daily. 10/06/22  Yes Dilan Fullenwider-Warren, Sadie Haber, NP  losartan-hydrochlorothiazide (HYZAAR) 100-25 MG tablet Take 1 tablet by mouth daily. 05/01/22  Yes Kerri Perches, MD  pantoprazole (PROTONIX) 20 MG tablet Take 1 tablet (20 mg total) by mouth daily. 05/01/22  Yes Kerri Perches, MD  predniSONE (DELTASONE) 20 MG tablet Take 2 tablets (40 mg total) by mouth daily with breakfast for 5 days. 10/06/22 10/11/22 Yes Greer Wainright-Warren, Sadie Haber, NP  promethazine-dextromethorphan (PROMETHAZINE-DM) 6.25-15 MG/5ML syrup Take 5 mLs by mouth 4 (four) times daily as needed. 10/06/22  Yes Karlos Scadden-Warren, Sadie Haber, NP  rosuvastatin (CRESTOR) 20 MG tablet Take half tablet twice weekly 06/11/22  Yes Kerri Perches, MD    Family History Family History  Problem Relation Age of Onset   Heart disease Mother        a fib   Kidney disease Mother    Heart disease Father        MI   Hyperlipidemia Father    Hypertension Father    Kidney disease Father    Kidney disease Brother    Diabetes Maternal Grandmother    Stroke Maternal Grandmother    Colon cancer Neg Hx     Social History Social History   Tobacco Use   Smoking status: Former   Smokeless tobacco: Never   Tobacco comments:    quit smoking for over 29 yrs.   Vaping Use   Vaping Use: Never used  Substance Use Topics   Alcohol use: No    Alcohol/week: 0.0 standard drinks of alcohol   Drug use: No     Allergies   Ivp dye [iodinated contrast media]   Review of Systems Review of Systems Per HPI  Physical Exam Triage Vital Signs ED Triage Vitals [10/06/22 1239]  Enc Vitals Group     BP (!) 142/86     Pulse Rate 78     Resp 18     Temp 98 F (36.7 C)     Temp Source Oral     SpO2 95 %     Weight      Height      Head Circumference      Peak Flow      Pain Score 6     Pain Loc      Pain Edu?      Excl.  in GC?    No data found.  Updated Vital Signs BP (!) 142/86 (BP Location: Right Arm)   Pulse 78   Temp 98 F (36.7 C) (Oral)   Resp 18   SpO2 95%   Visual Acuity Right Eye Distance:   Left Eye Distance:   Bilateral Distance:    Right Eye Near:   Left Eye Near:  Bilateral Near:     Physical Exam Vitals and nursing note reviewed.  Constitutional:      General: She is not in acute distress.    Appearance: Normal appearance.  HENT:     Head: Normocephalic.     Right Ear: Tympanic membrane, ear canal and external ear normal.     Left Ear: Tympanic membrane, ear canal and external ear normal.     Nose:     Right Sinus: Maxillary sinus tenderness present. No frontal sinus tenderness.     Left Sinus: Maxillary sinus tenderness present. No frontal sinus tenderness.     Mouth/Throat:     Lips: Pink.     Mouth: Mucous membranes are moist.     Pharynx: Uvula midline. Posterior oropharyngeal erythema present. No pharyngeal swelling.  Eyes:     Extraocular Movements: Extraocular movements intact.     Conjunctiva/sclera: Conjunctivae normal.     Pupils: Pupils are equal, round, and reactive to light.  Cardiovascular:     Rate and Rhythm: Normal rate and regular rhythm.     Pulses: Normal pulses.     Heart sounds: Normal heart sounds.  Pulmonary:     Effort: Pulmonary effort is normal. No respiratory distress.     Breath sounds: Normal breath sounds. No stridor. No wheezing, rhonchi or rales.  Abdominal:     General: Bowel sounds are normal.     Palpations: Abdomen is soft.     Tenderness: There is no abdominal tenderness.  Musculoskeletal:     Cervical back: Normal range of motion.  Lymphadenopathy:     Cervical: No cervical adenopathy.  Skin:    General: Skin is warm and dry.  Neurological:     General: No focal deficit present.     Mental Status: She is alert and oriented to person, place, and time.  Psychiatric:        Mood and Affect: Mood normal.         Behavior: Behavior normal.      UC Treatments / Results  Labs (all labs ordered are listed, but only abnormal results are displayed) Labs Reviewed - No data to display  EKG   Radiology DG Chest 2 View  Result Date: 10/06/2022 CLINICAL DATA:  Cough, shortness of breath. EXAM: CHEST - 2 VIEW COMPARISON:  January 02, 2017. FINDINGS: The heart size and mediastinal contours are within normal limits. Stable elevated right hemidiaphragm with minimal right basilar subsegmental atelectasis. Left lung is clear. The visualized skeletal structures are unremarkable. IMPRESSION: Stable elevated right hemidiaphragm with minimal right basilar subsegmental atelectasis. Electronically Signed   By: Lupita Raider M.D.   On: 10/06/2022 13:14    Procedures Procedures (including critical care time)  Medications Ordered in UC Medications - No data to display  Initial Impression / Assessment and Plan / UC Course  I have reviewed the triage vital signs and the nursing notes.  Pertinent labs & imaging results that were available during my care of the patient were reviewed by me and considered in my medical decision making (see chart for details).  The patient is well-appearing, she is in no acute distress, vital signs are stable.  Chest x-ray was negative for pneumonia.  Symptoms appear to be consistent with acute maxillary sinusitis, cough most likely exacerbated by postnasal drainage.  Will treat patient with Augmentin 875/125 mg tablets for sinusitis, prednisone 40 mg for her cough, Promethazine DM for her cough, and fluticasone 50 mcg nasal spray for nasal congestion and  sinus tenderness.  Supportive care recommendations were provided and discussed with the patient to include increasing fluids, allowing for plenty of rest, normal saline nasal spray throughout the day to help with nasal congestion, and use of a humidifier at nighttime during sleep.  Patient advised to follow-up with her PCP if symptoms do  not improve.  Patient was in agreement with this plan of care care and verbalizes understanding.  All questions were answered.  Patient is stable for discharge.   Final Clinical Impressions(s) / UC Diagnoses   Final diagnoses:  Acute maxillary sinusitis, recurrence not specified  Cough, unspecified type     Discharge Instructions      The chest x-ray was negative for pneumonia. Take medication as prescribed. Increase fluids and allow for plenty of rest.  Make sure you are drinking at least 8-10 8 ounce glasses of water while symptoms persist. May take over-the-counter Tylenol or ibuprofen as needed for pain, fever, or general discomfort. Recommend normal saline nasal spray throughout the day to help with nasal congestion and runny nose. For your cough, it may be helpful for you to use a humidifier in your bedroom at nighttime during sleep and sleep elevated on pillows while cough symptoms persist. If symptoms do not improve with this treatment, please follow-up with your primary care physician for further evaluation. Follow-up as needed.     ED Prescriptions     Medication Sig Dispense Auth. Provider   amoxicillin-clavulanate (AUGMENTIN) 875-125 MG tablet Take 1 tablet by mouth every 12 (twelve) hours. 14 tablet Adorian Gwynne-Warren, Sadie Haber, NP   fluticasone (FLONASE) 50 MCG/ACT nasal spray Place 2 sprays into both nostrils daily. 16 g Tawnia Schirm-Warren, Sadie Haber, NP   predniSONE (DELTASONE) 20 MG tablet Take 2 tablets (40 mg total) by mouth daily with breakfast for 5 days. 10 tablet Windsor Zirkelbach-Warren, Sadie Haber, NP   promethazine-dextromethorphan (PROMETHAZINE-DM) 6.25-15 MG/5ML syrup Take 5 mLs by mouth 4 (four) times daily as needed. 118 mL Glorian Mcdonell-Warren, Sadie Haber, NP      PDMP not reviewed this encounter.   Abran Cantor, NP 10/06/22 1338

## 2022-10-06 NOTE — ED Triage Notes (Signed)
Cough, body aches, chills, chest congestion that started 2 weeks ago. Tried nucin, tylenol, Nyquil, robitussin with no relief.

## 2022-10-28 ENCOUNTER — Telehealth: Payer: Self-pay | Admitting: Family Medicine

## 2022-10-28 ENCOUNTER — Other Ambulatory Visit: Payer: Self-pay

## 2022-10-28 MED ORDER — FLUOXETINE HCL 40 MG PO CAPS: 40.0000 mg | ORAL_CAPSULE | Freq: Every day | ORAL | 1 refills | Status: DC

## 2022-10-28 MED ORDER — LOSARTAN POTASSIUM-HCTZ 100-25 MG PO TABS: 1.0000 | ORAL_TABLET | Freq: Every day | ORAL | 1 refills | Status: DC

## 2022-10-28 MED ORDER — PANTOPRAZOLE SODIUM 20 MG PO TBEC: 20.0000 mg | DELAYED_RELEASE_TABLET | Freq: Every day | ORAL | 1 refills | Status: DC

## 2022-10-28 MED ORDER — AMLODIPINE BESYLATE 5 MG PO TABS: 5.0000 mg | ORAL_TABLET | Freq: Every day | ORAL | 0 refills | Status: DC

## 2022-10-28 MED ORDER — ROSUVASTATIN CALCIUM 20 MG PO TABS: ORAL_TABLET | ORAL | 3 refills | Status: DC

## 2022-10-28 MED ORDER — FENOFIBRATE 48 MG PO TABS: 48.0000 mg | ORAL_TABLET | Freq: Every day | ORAL | 1 refills | Status: DC

## 2022-10-28 NOTE — Telephone Encounter (Signed)
Patient called need to switch over to [facility]. [facility] out of [place]   amLODipine (NORVASC) 5 MG tablet [[id number]]    fenofibrate (TRICOR) 48 MG tablet [[id number]]   FLUoxetine (PROZAC) 40 MG capsule [[id number]]    losartan-hydrochlorothiazide (HYZAAR) 100-25 MG tablet [[id number]   pantoprazole (PROTONIX) 20 MG tablet [[id number]]   rosuvastatin (CRESTOR) 20 MG tablet [[id number]]    [facility] pharmacy  PLEASE CALL PATIENT WHEN SENT TO THAT PHARMACY

## 2022-10-28 NOTE — Telephone Encounter (Signed)
Refills sent

## 2022-10-29 NOTE — Telephone Encounter (Signed)
Patient aware medication sent to mail order pharmacy

## 2022-10-29 NOTE — Telephone Encounter (Signed)
Patient is asking for a call back about medication refills

## 2022-11-29 DIAGNOSIS — R1032 Left lower quadrant pain: Secondary | ICD-10-CM | POA: Diagnosis not present

## 2022-12-17 ENCOUNTER — Encounter: Payer: Self-pay | Admitting: Gastroenterology

## 2022-12-17 ENCOUNTER — Encounter: Payer: Self-pay | Admitting: Family Medicine

## 2022-12-17 DIAGNOSIS — K5792 Diverticulitis of intestine, part unspecified, without perforation or abscess without bleeding: Secondary | ICD-10-CM

## 2022-12-18 MED ORDER — METRONIDAZOLE 500 MG PO TABS: 500.0000 mg | ORAL_TABLET | Freq: Three times a day (TID) | ORAL | 0 refills | Status: AC

## 2022-12-18 MED ORDER — CIPROFLOXACIN HCL 500 MG PO TABS: 500.0000 mg | ORAL_TABLET | Freq: Two times a day (BID) | ORAL | 0 refills | Status: AC

## 2022-12-18 NOTE — Addendum Note (Signed)
Addended by: Missy Sabins on: 12/18/2022 12:05 PM   Modules accepted: Orders

## 2022-12-18 NOTE — Telephone Encounter (Signed)
Dr. Chales Abrahams as DOD AM of 12/18/22 please advise, thanks.  Dr. Myrtie Neither' patient with a history of diverticulitis, last seen in March, has had recurrent diverticulitis last month, still having pain please advise. Thanks

## 2022-12-18 NOTE — Telephone Encounter (Signed)
Lab orders in epic. Cipro and Flagyl prescription sent to [facility] pharmacy. Patient notified of recommendations via MyChart.

## 2022-12-18 NOTE — Telephone Encounter (Signed)
Lab orders updated to be collected at [facility] in [place] per pt request.  Lab orders also printed and faxed.

## 2022-12-18 NOTE — Addendum Note (Signed)
Addended by: Missy Sabins on: 12/18/2022 10:55 AM   Modules accepted: Orders

## 2022-12-18 NOTE — Telephone Encounter (Signed)
Check CBC, CMP, CRP Reasonable to give her another trial of Cipro 500 BID/Flagyl 500 TID x 10 days for presumed diverticulitis If not better or gets worse, needs CT Abdo/pelvis with contrast (after contrast desensitization) Also start probiotics 1/day (was recently on Augmentin 09/2022) She needs to call us and let us know how she is next week.  RG

## 2022-12-19 DIAGNOSIS — K5792 Diverticulitis of intestine, part unspecified, without perforation or abscess without bleeding: Secondary | ICD-10-CM | POA: Diagnosis not present

## 2022-12-24 ENCOUNTER — Encounter: Payer: Self-pay | Admitting: Family Medicine

## 2022-12-25 ENCOUNTER — Ambulatory Visit: Payer: 59 | Admitting: Gastroenterology

## 2022-12-26 ENCOUNTER — Other Ambulatory Visit: Payer: Self-pay

## 2022-12-26 DIAGNOSIS — D509 Iron deficiency anemia, unspecified: Secondary | ICD-10-CM

## 2022-12-26 NOTE — Telephone Encounter (Signed)
Labs ordered.

## 2022-12-29 ENCOUNTER — Telehealth: Payer: Self-pay

## 2022-12-29 ENCOUNTER — Telehealth: Payer: Self-pay | Admitting: *Deleted

## 2022-12-29 ENCOUNTER — Other Ambulatory Visit: Payer: Self-pay

## 2022-12-29 DIAGNOSIS — E782 Mixed hyperlipidemia: Secondary | ICD-10-CM

## 2022-12-29 DIAGNOSIS — R7303 Prediabetes: Secondary | ICD-10-CM

## 2022-12-29 NOTE — Telephone Encounter (Signed)
Patient scheduled for a follow up did you want any blood work before this visit ?

## 2022-12-29 NOTE — Telephone Encounter (Signed)
Patient aware.

## 2022-12-29 NOTE — Telephone Encounter (Signed)
I connected with Lavetta Nielsen on 8/12 at [phone] by telephone and verified that I am speaking with the correct person using two identifiers. According to the patient's chart they are due for follow up with [facility]. Pt scheduled. There are no transportation issues at this time. Msg sent to CMA to ask about labs that need ordering before visit.  Nothing further was needed at the end of our conversation.

## 2022-12-31 DIAGNOSIS — R7303 Prediabetes: Secondary | ICD-10-CM | POA: Diagnosis not present

## 2022-12-31 DIAGNOSIS — E782 Mixed hyperlipidemia: Secondary | ICD-10-CM | POA: Diagnosis not present

## 2022-12-31 DIAGNOSIS — D509 Iron deficiency anemia, unspecified: Secondary | ICD-10-CM | POA: Diagnosis not present

## 2023-01-14 ENCOUNTER — Ambulatory Visit: Payer: 59 | Admitting: Family Medicine

## 2023-01-14 ENCOUNTER — Encounter: Payer: Self-pay | Admitting: Family Medicine

## 2023-01-14 VITALS — BP 131/85 | HR 85 | Ht 62.0 in | Wt 220.1 lb

## 2023-01-14 DIAGNOSIS — Z1231 Encounter for screening mammogram for malignant neoplasm of breast: Secondary | ICD-10-CM

## 2023-01-14 DIAGNOSIS — M722 Plantar fascial fibromatosis: Secondary | ICD-10-CM

## 2023-01-14 DIAGNOSIS — F324 Major depressive disorder, single episode, in partial remission: Secondary | ICD-10-CM | POA: Diagnosis not present

## 2023-01-14 DIAGNOSIS — I10 Essential (primary) hypertension: Secondary | ICD-10-CM | POA: Diagnosis not present

## 2023-01-14 DIAGNOSIS — R3989 Other symptoms and signs involving the genitourinary system: Secondary | ICD-10-CM

## 2023-01-14 DIAGNOSIS — R3912 Poor urinary stream: Secondary | ICD-10-CM

## 2023-01-14 DIAGNOSIS — E782 Mixed hyperlipidemia: Secondary | ICD-10-CM

## 2023-01-14 DIAGNOSIS — R7303 Prediabetes: Secondary | ICD-10-CM | POA: Diagnosis not present

## 2023-01-14 DIAGNOSIS — R1030 Lower abdominal pain, unspecified: Secondary | ICD-10-CM | POA: Diagnosis not present

## 2023-01-14 NOTE — Patient Instructions (Addendum)
F/U in 4 months, call if you need me sooner  You are referred to  GI and I recommend you go  You are being referred to u Urology and also to Podiatry  Please continue to work on reduced sugar and starch intake    Please schedule January mammogram at checkout  Thanks for choosing [facility], we consider it a privelige to serve you.

## 2023-01-14 NOTE — Progress Notes (Signed)
Caitlin Sampson     MRN: [id number]      DOB: [birth year]  Chief Complaint  Patient presents with   Follow-up    Follow up discuss labs, plantars fascitis     HPI [name] is here for follow up and re-evaluation of chronic medical conditions, medication management and review of any available recent lab and radiology data.  Preventive health is updated, specifically  Cancer screening and Immunization.   Questions or concerns regarding consultations or procedures which the PT has had in the interim are  addressed. The PT denies any adverse reactions to current medications since the last visit.  2 [facility] of [facility], and prior to this  Needs gI re eval, remains anxious and concerned re possible malignancy  in colon and I will request in office GI re eval tore address this   Bladder pain and poor stream progressing in past 2 to 3  months, needs urology eval    ROS Denies recent fever or chills. Denies sinus pressure, nasal congestion, ear pain or sore throat. Denies chest congestion, productive cough or wheezing. Denies chest pains, palpitations and leg swelling .   Denies dysuria, frequency, hesitancy or incontinence. Denies joint pain, swelling and limitation in mobility. Denies headaches, seizures, numbness, or tingling. Denies depression,  has increased anxiety due to health  Denies skin break down or rash.   PE  BP 131/85 (BP Location: Right Arm, Patient Position: Sitting, Cuff Size: Large)   Pulse 85   Ht 5\' 2"  (1.575 m)   Wt 220 lb 1.9 oz (99.8 kg)   SpO2 95%   BMI 40.26 kg/m   Patient alert and oriented and in no cardiopulmonary distress.  HEENT: No facial asymmetry, EOMI,     Neck supple .  Chest: Clear to auscultation bilaterally.  CVS: S1, S2 no murmurs, no S3.Regular rate.     Ext: No edema  MS: Adequate ROM spine, shoulders, hips and knees.  Skin: Intact, no ulcerations or rash noted.  Psych: Good eye contact,  normal affect. Memory intact mildly  anxious not  depressed appearing.  CNS: CN 2-12 intact, power,  normal throughout.no focal deficits noted.   Assessment & Plan  Essential hypertension Controlled, no change in medication DASH diet and commitment to daily physical activity for a minimum of 30 minutes discussed and encouraged, as a part of hypertension management. The importance of attaining a healthy weight is also discussed.     01/14/2023    8:34 AM 10/06/2022   12:39 PM 08/05/2022    2:08 PM 07/09/2022    8:24 AM 05/01/2022    1:45 PM 05/01/2022    1:40 PM 10/31/2021    3:19 PM  BP/Weight  Systolic BP 131 142 136 124 140 157 148  Diastolic BP 85 86 80 85 88 93 82  Wt. (Lbs) 220.12  222.13   213.12 216  BMI 40.26 kg/m2  40.63 kg/m2   38.98 kg/m2 39.51 kg/m2       Depression, major, single episode, in partial remission (HCC) Not controlled, not suicidal or homicidal , overwhelmed with personal health  issues No med change  Hyperlipidemia Hyperlipidemia:Low fat diet discussed and encouraged.   Lipid Panel  Lab Results  Component Value Date   CHOL 216 (H) 12/31/2022   HDL 86 12/31/2022   LDLCALC 114 (H) 12/31/2022   TRIG 95 12/31/2022   CHOLHDL 2.5 12/31/2022     Uncontrolled but improved ,  will inc dose of srestor if perisist   Prediabetes Patient educated about the importance of limiting  Carbohydrate intake , the need to commit to daily physical activity for a minimum of 30 minutes , and to commit weight loss. The fact that changes in all these areas will reduce or eliminate all together the development of diabetes is stressed.  Deteriorated     Latest Ref Rng & Units 12/31/2022    7:39 AM 12/19/2022    9:03 AM 04/28/2022    8:09 AM 10/25/2021    8:22 AM 05/01/2021    3:44 PM  Diabetic Labs  HbA1c 4.8 - 5.6 % 6.3   6.1  6.1  5.6   Chol 100 - 199 mg/dL 324   401  027    HDL >25 mg/dL 86   83  69    Calc LDL 0 - 99 mg/dL 366   440  347    Triglycerides 0 -  149 mg/dL 95   425  956    Creatinine 0.57 - 1.00 mg/dL  3.87  5.64  3.32        01/14/2023    8:34 AM 10/06/2022   12:39 PM 08/05/2022    2:08 PM 07/09/2022    8:24 AM 05/01/2022    1:45 PM 05/01/2022    1:40 PM 10/31/2021    3:19 PM  BP/Weight  Systolic BP 131 142 136 124 140 157 148  Diastolic BP 85 86 80 85 88 93 82  Wt. (Lbs) 220.12  222.13   213.12 216  BMI 40.26 kg/m2  40.63 kg/m2   38.98 kg/m2 39.51 kg/m2      Latest Ref Rng & Units 09/03/2022   12:00 AM 09/10/2021   12:00 AM  Foot/eye exam completion dates  Eye Exam No Retinopathy No Retinopathy     No Retinopathy         This result is from an external source.    Updated lab needed at/ before next visit.   Lower abdominal pain Recurrent lower abdominal pain,question of divrticulitis, re  eval f/u with GI for E/M  Bladder pain 3 monht history whih is worsening , refer Urology  Poor urinary stream Urology to eval and manage  Plantar fasciitis Bilateral foot pain x months, little to no response to OTC meds and cool compresses, refer Podiatry

## 2023-01-22 ENCOUNTER — Other Ambulatory Visit: Payer: Self-pay | Admitting: Family Medicine

## 2023-01-22 ENCOUNTER — Encounter: Payer: Self-pay | Admitting: Family Medicine

## 2023-01-22 DIAGNOSIS — R1030 Lower abdominal pain, unspecified: Secondary | ICD-10-CM | POA: Insufficient documentation

## 2023-01-22 DIAGNOSIS — M722 Plantar fascial fibromatosis: Secondary | ICD-10-CM | POA: Insufficient documentation

## 2023-01-22 DIAGNOSIS — R3989 Other symptoms and signs involving the genitourinary system: Secondary | ICD-10-CM | POA: Insufficient documentation

## 2023-01-22 DIAGNOSIS — R3912 Poor urinary stream: Secondary | ICD-10-CM | POA: Insufficient documentation

## 2023-01-22 NOTE — Assessment & Plan Note (Signed)
Controlled, no change in medication DASH diet and commitment to daily physical activity for a minimum of 30 minutes discussed and encouraged, as a part of hypertension management. The importance of attaining a healthy weight is also discussed.     01/14/2023    8:34 AM 10/06/2022   12:39 PM 08/05/2022    2:08 PM 07/09/2022    8:24 AM 05/01/2022    1:45 PM 05/01/2022    1:40 PM 10/31/2021    3:19 PM  BP/Weight  Systolic BP 131 142 136 124 140 157 148  Diastolic BP 85 86 80 85 88 93 82  Wt. (Lbs) 220.12  222.13   213.12 216  BMI 40.26 kg/m2  40.63 kg/m2   38.98 kg/m2 39.51 kg/m2

## 2023-01-22 NOTE — Assessment & Plan Note (Signed)
Bilateral foot pain x months, little to no response to OTC meds and cool compresses, refer Podiatry

## 2023-01-22 NOTE — Assessment & Plan Note (Addendum)
Patient educated about the importance of limiting  Carbohydrate intake , the need to commit to daily physical activity for a minimum of 30 minutes , and to commit weight loss. The fact that changes in all these areas will reduce or eliminate all together the development of diabetes is stressed.  Deteriorated     Latest Ref Rng & Units 12/31/2022    7:39 AM 12/19/2022    9:03 AM 04/28/2022    8:09 AM 10/25/2021    8:22 AM 05/01/2021    3:44 PM  Diabetic Labs  HbA1c 4.8 - 5.6 % 6.3   6.1  6.1  5.6   Chol 100 - 199 mg/dL 604   540  981    HDL >19 mg/dL 86   83  69    Calc LDL 0 - 99 mg/dL 147   829  562    Triglycerides 0 - 149 mg/dL 95   130  865    Creatinine 0.57 - 1.00 mg/dL  7.84  6.96  2.95        01/14/2023    8:34 AM 10/06/2022   12:39 PM 08/05/2022    2:08 PM 07/09/2022    8:24 AM 05/01/2022    1:45 PM 05/01/2022    1:40 PM 10/31/2021    3:19 PM  BP/Weight  Systolic BP 131 142 136 124 140 157 148  Diastolic BP 85 86 80 85 88 93 82  Wt. (Lbs) 220.12  222.13   213.12 216  BMI 40.26 kg/m2  40.63 kg/m2   38.98 kg/m2 39.51 kg/m2      Latest Ref Rng & Units 09/03/2022   12:00 AM 09/10/2021   12:00 AM  Foot/eye exam completion dates  Eye Exam No Retinopathy No Retinopathy     No Retinopathy         This result is from an external source.    Updated lab needed at/ before next visit.

## 2023-01-22 NOTE — Assessment & Plan Note (Signed)
Recurrent lower abdominal pain,question of divrticulitis, re  eval f/u with GI for E/M

## 2023-01-22 NOTE — Assessment & Plan Note (Signed)
Urology to eval and manage

## 2023-01-22 NOTE — Assessment & Plan Note (Signed)
Hyperlipidemia:Low fat diet discussed and encouraged.   Lipid Panel  Lab Results  Component Value Date   CHOL 216 (H) 12/31/2022   HDL 86 12/31/2022   LDLCALC 114 (H) 12/31/2022   TRIG 95 12/31/2022   CHOLHDL 2.5 12/31/2022     Uncontrolled but improved , will inc dose of srestor if perisist

## 2023-01-22 NOTE — Assessment & Plan Note (Signed)
3 monht history whih is worsening , refer Urology

## 2023-01-22 NOTE — Assessment & Plan Note (Signed)
Not controlled, not suicidal or homicidal , overwhelmed with personal health  issues No med change

## 2023-01-28 ENCOUNTER — Telehealth: Payer: Self-pay | Admitting: Family Medicine

## 2023-01-28 ENCOUNTER — Other Ambulatory Visit: Payer: Self-pay

## 2023-01-28 MED ORDER — PANTOPRAZOLE SODIUM 20 MG PO TBEC: 20.0000 mg | DELAYED_RELEASE_TABLET | Freq: Every day | ORAL | 1 refills | Status: DC

## 2023-01-28 NOTE — Telephone Encounter (Signed)
Prescription Request  01/28/2023  LOV: 01/14/2023  What is the name of the medication or equipment? pantoprazole (PROTONIX) 20 MG tablet   Have you contacted your pharmacy to request a refill? Yes   Which pharmacy would you like this sent to?  [facility]   Patient notified that their request is being sent to the clinical staff for review and that they should receive a response within 2 business days.   Please advise at Mobile [phone] (mobile)

## 2023-01-28 NOTE — Telephone Encounter (Signed)
Refills sent

## 2023-02-02 ENCOUNTER — Encounter: Payer: Self-pay | Admitting: Podiatry

## 2023-02-02 ENCOUNTER — Ambulatory Visit: Payer: 59 | Admitting: Podiatry

## 2023-02-02 ENCOUNTER — Ambulatory Visit (INDEPENDENT_AMBULATORY_CARE_PROVIDER_SITE_OTHER): Payer: 59

## 2023-02-02 DIAGNOSIS — M722 Plantar fascial fibromatosis: Secondary | ICD-10-CM

## 2023-02-02 MED ORDER — TRIAMCINOLONE ACETONIDE 10 MG/ML IJ SUSP
10.0000 mg | Freq: Once | INTRAMUSCULAR | Status: AC
Start: 2023-02-02 — End: 2023-02-02
  Administered 2023-02-02: 10 mg via INTRA_ARTICULAR

## 2023-02-02 NOTE — Patient Instructions (Signed)

## 2023-02-03 NOTE — Progress Notes (Signed)
Subjective:   Patient ID: Caitlin Sampson, female   DOB: 62 y.o.   MRN: [id number]   HPI The patient presents with pain in the plantar aspect of the left foot and also states that she is getting the new orthotics.  States has been very tender and making walking different and has been present for at least 6 months.  Patient does not smoke and tries to be active   Review of Systems  All other systems reviewed and are negative.       Objective:  Physical Exam Vitals and nursing note reviewed.  Constitutional:      Appearance: She is well-developed.  Pulmonary:     Effort: Pulmonary effort is normal.  Musculoskeletal:        General: Normal range of motion.  Skin:    General: Skin is warm.  Neurological:     Mental Status: She is alert.     Neurovascular status intact muscle strength adequate range of motion adequate with patient noted to have exquisite discomfort in the plantar aspect of the left heel at the insertional point of the tendon into the calcaneus with moderate depression of the arch noted.  Good digital perfusion well-oriented x 3      Assessment:  Acute plantar fasciitis left inflammation fluid around the medial band moderate depression of the arch     Plan:  H&P reviewed went ahead today did sterile prep injected the plantar fascial insertion 3 mg Kenalog 5 mg Xylocaine and instructed on physical therapy and dispensed fascial brace to lift the arch up.  Patient to be seen back  X-rays indicate large spur no indication stress fracture arthritis

## 2023-02-16 ENCOUNTER — Ambulatory Visit: Payer: 59 | Admitting: Podiatry

## 2023-02-16 ENCOUNTER — Encounter: Payer: Self-pay | Admitting: Podiatry

## 2023-02-16 VITALS — BP 138/68 | HR 62

## 2023-02-16 DIAGNOSIS — M722 Plantar fascial fibromatosis: Secondary | ICD-10-CM

## 2023-02-16 NOTE — Progress Notes (Signed)
Subjective:   Patient ID: Caitlin Sampson, female   DOB: 62 y.o.   MRN: [id number]   HPI Patient states feeling a lot better with reduced discomfort in the feet   ROS      Objective:  Physical Exam  Neurovascular status intact with inflammation plantar left that is improved     Assessment:  Acute plantar fasciitis left improved     Plan:  Reviewed continued physical therapy shoe gear modifications stretching and patient will be seen back as needed

## 2023-02-17 NOTE — Progress Notes (Signed)
Name: Caitlin Sampson DOB: [birth year] MRN: [id number]  History of Present Illness: Caitlin Sampson is a 62 y.o. female who presents today as a new patient at [facility] . All available relevant medical records have been reviewed. She is accompanied by her husband Caitlin Sampson. - GU History: 1. Kidney stone(s). Passed spontaneously; no prior stone procedures.  She reports chief complaint of intermittent weak urinary stream, hesitancy, straining to void, and sensations of incomplete emptying. She denies increased urinary urgency, frequency, dysuria, gross hematuria, or fecaluria.   She reports an intermittent sensation of bladder pressure / fullness which is worse during acute bouts of diverticulitis or constipation but sometimes present without that. Denies bladder pain.   She reports history of benign rectal mass, IBS with occasional constipation, recurrent diverticulitis, hemorrhoids. She has an upcoming appointment with GI on 04/02/2023 for evaluation of abdominal pain.  She denies acute flank pain, abdominal pain, fevers, nausea, or vomiting. Denies recent stone passage. She denies history of recent or recurrent UTI. Used to hold urine for long periods of time at work.  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 She has an upcoming appointment with GYN.   12/19/2022: Normal renal function (GFR 87; creatinine 0.78).  Fall Screening: Do you usually have a device to assist in your mobility? No   Medications: Current Outpatient Medications  Medication Sig Dispense Refill   amLODipine (NORVASC) 5 MG tablet TAKE 1 TABLET DAILY 90 tablet 0   aspirin EC 81 MG tablet Take 81 mg by mouth daily. Swallow whole.     fenofibrate (TRICOR) 48 MG tablet Take 1 tablet (48 mg total) by mouth daily. 90 tablet 1   FLUoxetine (PROZAC) 40 MG capsule Take 1 capsule (40 mg total) by mouth daily. 90 capsule 1   fluticasone (FLONASE) 50 MCG/ACT nasal spray Place 2 sprays into both nostrils daily.  16 g 0   losartan-hydrochlorothiazide (HYZAAR) 100-25 MG tablet Take 1 tablet by mouth daily. 90 tablet 1   pantoprazole (PROTONIX) 20 MG tablet Take 1 tablet (20 mg total) by mouth daily. 90 tablet 1   promethazine-dextromethorphan (PROMETHAZINE-DM) 6.25-15 MG/5ML syrup Take 5 mLs by mouth 4 (four) times daily as needed. 118 mL 0   rosuvastatin (CRESTOR) 20 MG tablet Take half tablet twice weekly 90 tablet 3   No current facility-administered medications for this visit.    Allergies: Allergies  Allergen Reactions   Ivp Dye [Iodinated Contrast Media] Itching    Past Medical History:  Diagnosis Date   Anxiety    Carcinoid tumor    Complication of anesthesia    felt strangled when woke up   Diverticulitis    Diverticulosis of colon 2012   GERD (gastroesophageal reflux disease)    History of hiatal hernia    History of kidney stones    Hyperlipidemia    Hypertension    IDA (iron deficiency anemia)    hx of iron def.   Poison oak dermatitis 2012   Past Surgical History:  Procedure Laterality Date   ABDOMINAL HYSTERECTOMY     CARDIAC CATHETERIZATION  09/05/03   EF 57%. NORMAL CORONARY ARTERIES. NO EVIDENCE OF RENAL ARTERY STENOSIS.   CHOLECYSTECTOMY     COLONOSCOPY N/A 06/14/2018   Procedure: COLONOSCOPY;  Surgeon: Malissa Hippo, MD;  Location: [facility];  Service: Endoscopy;  Laterality: N/A;  730   ESOPHAGOGASTRODUODENOSCOPY N/A 10/17/2015   Procedure: ESOPHAGOGASTRODUODENOSCOPY (EGD);  Surgeon: Malissa Hippo, MD;  Location: [facility];  Service:  Endoscopy;  Laterality: N/A;  3:00   LAPAROSCOPIC GASTRIC SLEEVE RESECTION N/A 05/05/2017   Procedure: LAPAROSCOPIC GASTRIC SLEEVE RESECTION, UPPER ENDO;  Surgeon: Luretha Murphy, MD;  Location: [facility];  Service: General;  Laterality: N/A;   MYOCARDIAL PERFUSION STUDY  03/30/09   NORMAL. EF 76%.   sleeve gastrectomy     05-05-18   TRANSTHORACIC ECHOCARDIOGRAM  10/10/08   NORMAL. EF => 55%.   Family History  Problem  Relation Age of Onset   Heart disease Mother        a fib   Kidney disease Mother    Heart disease Father        MI   Hyperlipidemia Father    Hypertension Father    Kidney disease Father    Kidney disease Brother    Diabetes Maternal Grandmother    Stroke Maternal Grandmother    Colon cancer Neg Hx    Social History   Socioeconomic History   Marital status: Married    Spouse name: Not on file   Number of children: Not on file   Years of education: Not on file   Highest education level: Not on file  Occupational History   Not on file  Tobacco Use   Smoking status: Former   Smokeless tobacco: Never   Tobacco comments:    quit smoking for over 29 yrs.   Vaping Use   Vaping status: Never Used  Substance and Sexual Activity   Alcohol use: No    Alcohol/week: 0.0 standard drinks of alcohol   Drug use: No   Sexual activity: Yes  Other Topics Concern   Not on file  Social History Narrative   Not on file   Social Determinants of Health   Financial Resource Strain: Not on file  Food Insecurity: Not on file  Transportation Needs: No Transportation Needs (12/29/2022)   PRAPARE - Administrator, Civil Service (Medical): No    Lack of Transportation (Non-Medical): No  Physical Activity: Not on file  Stress: Not on file  Social Connections: Not on file  Intimate Partner Violence: Not on file    SUBJECTIVE  Review of Systems Constitutional: Patient denies any unintentional weight loss or change in strength lntegumentary: Patient denies any rashes or pruritus Cardiovascular: Patient denies chest pain or syncope Respiratory: Patient denies shortness of breath Gastrointestinal: As per HPI Musculoskeletal: Patient denies muscle cramps or weakness Neurologic: Patient denies convulsions or seizures Allergic/Immunologic: Patient denies recent allergic reaction(s) Hematologic/Lymphatic: Patient denies bleeding tendencies Endocrine: Patient denies heat/cold  intolerance  GU: As per HPI.  OBJECTIVE Vitals:   02/20/23 0837  BP: 125/84  Pulse: 70  Temp: 98.4 F (36.9 C)   There is no height or weight on file to calculate BMI.  Physical Examination Constitutional: No obvious distress; patient is non-toxic appearing  Cardiovascular: No visible lower extremity edema.  Respiratory: The patient does not have audible wheezing/stridor; respirations do not appear labored  Gastrointestinal: Abdomen non-distended Musculoskeletal: Normal ROM of UEs  Skin: No obvious rashes/open sores  Neurologic: CN 2-12 grossly intact Psychiatric: Answered questions appropriately with normal affect  Hematologic/Lymphatic/Immunologic: No obvious bruises or sites of spontaneous bleeding  UA: negative  PVR: 1 ml  ASSESSMENT Sensation of pressure in bladder area - Plan: US RENAL  Voiding dysfunction - Plan: US RENAL  CALCULUS, KIDNEY - Plan: BLADDER SCAN AMB NON-IMAGING, Urinalysis, Routine w reflex microscopic, US RENAL  We discussed history in detail. No acute findings today. Agreed to obtain renal/bladder  ultrasound for further evaluation of bladder pressure sensation to assess for possible stone, mass, etc. We discussed low suspicion for colovesical fistula however if GU symptoms persist may consider cystoscopy.   Regarding intermittent voiding dysfunction we discussed likely etiology of constipation and/or pelvic floor muscle tension. Advised constipation management with adequate fluid intake, fiber supplementation, "squatty-potty", breathing techniques, focusing on pelvic floor muscle relaxation, and not rushing when trying to defecate, stool softeners and/or laxatives PRN. Advised to avoid straining to urinate / defecate as that may exacerbate pelvic floor muscle tension. We discussed option to consider pelvic floor physical therapy. Advised to follow up with Gastroenterology next month as planned.   Minimal concern for GYN etiology for symptoms given  history of LAVH, however ovarian cyst may be possible. Advised to follow up with GYN provider next month as planned.   Discussed option to schedule follow up with urology for routine stone surveillance. She elected to plan for follow up with urology PRN depending on RUS findings and symptoms. All questions were answered.  PLAN Advised the following: RUS. Constipation management (as above). Follow up with GI & GYN as planned. Return for urology f/u: to be determined.  Orders Placed This Encounter  Procedures   US RENAL    Standing Status:   Future    Standing Expiration Date:   02/20/2024    Order Specific Question:   Reason for Exam (SYMPTOM  OR DIAGNOSIS REQUIRED)    Answer:   kidney stone known or suspected    Order Specific Question:   Preferred imaging location?    Answer:   [facility]   Urinalysis, Routine w reflex microscopic   BLADDER SCAN AMB NON-IMAGING    It has been explained that the patient is to follow regularly with their PCP in addition to all other providers involved in their care and to follow instructions provided by these respective offices. Patient advised to contact urology clinic if any urologic-pertaining questions, concerns, new symptoms or problems arise in the interim period.  There are no Patient Instructions on file for this visit.  Electronically signed by:  Donnita Falls, MSN, FNP-C, CUNP 02/20/2023 9:18 AM

## 2023-02-20 ENCOUNTER — Encounter: Payer: Self-pay | Admitting: Urology

## 2023-02-20 ENCOUNTER — Ambulatory Visit: Payer: 59 | Admitting: Urology

## 2023-02-20 VITALS — BP 125/84 | HR 70 | Temp 98.4°F

## 2023-02-20 DIAGNOSIS — N398 Other specified disorders of urinary system: Secondary | ICD-10-CM | POA: Diagnosis not present

## 2023-02-20 DIAGNOSIS — N200 Calculus of kidney: Secondary | ICD-10-CM | POA: Diagnosis not present

## 2023-02-20 DIAGNOSIS — R3912 Poor urinary stream: Secondary | ICD-10-CM

## 2023-02-20 DIAGNOSIS — R3989 Other symptoms and signs involving the genitourinary system: Secondary | ICD-10-CM | POA: Diagnosis not present

## 2023-02-20 LAB — BLADDER SCAN AMB NON-IMAGING: Scan Result: 0

## 2023-02-20 LAB — URINALYSIS, ROUTINE W REFLEX MICROSCOPIC
Bilirubin, UA: NEGATIVE
Glucose, UA: NEGATIVE
Ketones, UA: NEGATIVE
Leukocytes,UA: NEGATIVE
Nitrite, UA: NEGATIVE
Protein,UA: NEGATIVE
RBC, UA: NEGATIVE
Specific Gravity, UA: 1.02 (ref 1.005–1.030)
Urobilinogen, Ur: 1 mg/dL (ref 0.2–1.0)
pH, UA: 7 (ref 5.0–7.5)

## 2023-03-04 ENCOUNTER — Ambulatory Visit (HOSPITAL_COMMUNITY)
Admission: RE | Admit: 2023-03-04 | Discharge: 2023-03-04 | Disposition: A | Discharge status: Home / Self Care | Payer: 59 | Source: Ambulatory Visit | Attending: Urology | Admitting: Urology

## 2023-03-04 DIAGNOSIS — Z0389 Encounter for observation for other suspected diseases and conditions ruled out: Secondary | ICD-10-CM | POA: Diagnosis not present

## 2023-03-04 DIAGNOSIS — R3989 Other symptoms and signs involving the genitourinary system: Secondary | ICD-10-CM | POA: Insufficient documentation

## 2023-03-04 DIAGNOSIS — N398 Other specified disorders of urinary system: Secondary | ICD-10-CM | POA: Insufficient documentation

## 2023-03-04 DIAGNOSIS — N200 Calculus of kidney: Secondary | ICD-10-CM | POA: Diagnosis not present

## 2023-03-05 NOTE — Progress Notes (Signed)
Letter sent.

## 2023-03-25 ENCOUNTER — Encounter: Payer: Self-pay | Admitting: Family Medicine

## 2023-03-26 ENCOUNTER — Other Ambulatory Visit: Payer: Self-pay

## 2023-03-26 MED ORDER — LOSARTAN POTASSIUM-HCTZ 100-25 MG PO TABS: 1.0000 | ORAL_TABLET | Freq: Every day | ORAL | 0 refills | Status: DC

## 2023-04-02 ENCOUNTER — Telehealth: Payer: Self-pay | Admitting: Gastroenterology

## 2023-04-02 ENCOUNTER — Ambulatory Visit: Payer: 59 | Admitting: Gastroenterology

## 2023-04-02 NOTE — Telephone Encounter (Signed)
Scheduled OV with patient for 11/20 at 9:40 am with Dr. Myrtie Neither.

## 2023-04-02 NOTE — Telephone Encounter (Signed)
She has had recurrent abdominal pain, and I need to see her in person for an examination.  We can either both mask for the appointment this afternoon, or she can be put in as an overbook appointment in my morning clinic session next Wednesday, November 20 that 9:40 AM (920 arrival).  - H. Danis

## 2023-04-02 NOTE — Telephone Encounter (Signed)
Patient stated she did not want to get anyone sick, would rather reschedule for overbook appointment.   Caitlin Sampson or Caitlin Sampson could you possibly book this as I am not able to overbook appointments.   Thank you!

## 2023-04-02 NOTE — Telephone Encounter (Signed)
Good morning Dr. Myrtie Neither,   I received a call from this patient requesting for visit today with you at 3:40PM to be virtual. Patient states she has an upper respiratory infection but does not wish to reschedule until February. Would you please advise if visit can be MyChart Video?  Thank you.

## 2023-04-07 NOTE — Progress Notes (Unsigned)
[name]  Chief Complaint: Lower abdominal pain  Subjective  Prior history  Colonoscopy report from [facility] in 2014 indicates a rectal nodule removed. 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  No rectal lesion found. Colonoscopy report by Dr. Karilyn Cota January 2020 on file.  Complete exam, photos indicate excellent prep, left-sided diverticulosis, otherwise normal exam.  10-year recall recommended.  Established care with [facility] March 2024 for episodic abdominal pain bloating and altered bowel habits, some episodes of been treated empirically as diverticulitis. Treated at outside clinic July 2024 for presumed diverticulitis, and contacted our office shortly afterward having ongoing pain.  Covering physician at our practice prescribe 10 days of ciprofloxacin and metronidazole.  Patient has IV contrast dye and thus uncommonly has CT scans during these episodes.   She was scheduled with one of our APP's on 12/25/2022 but canceled that appointment.  She was scheduled to see me 04/01/2023 but rescheduled to today because she had a respiratory infection. Discussed the use of AI scribe software for clinical note transcription with the patient, who gave verbal consent to proceed.  History of Present Illness   The patient, with a history of recurrent lower abdominal pain and possible diverticulitis, presents with complaints of sudden onset abdominal pain, bloating, and altered bowel habits. The pain is described as severe, located in the lower abdomen, and associated with a sensation of needing to defecate but being unable to do so. The patient also reports a sensation of bloating and distension.  The patient's bowel habits are described as irregular, alternating between constipation and loose stools. There was a recent episode where the stool appeared like coffee grounds, but no blood was noted. The  patient has been self-managing with over-the-counter probiotics for the past two weeks, which seems to have improved the regularity and consistency of bowel movements.  The patient has had multiple episodes of this pain, treated with antibiotics, with the most recent episode occurring in July. Despite completing the prescribed course of antibiotics, the patient reported persistent symptoms, necessitating another course of antibiotics. The patient reports occasional episodes of discomfort that raise concern for recurrent diverticulitis, but these have not progressed to full-blown episodes.  The patient also has a history of a dye allergy, which has implications for diagnostic imaging. The patient has not noticed any blood in the stool. The last colonoscopy, performed in early 2020, was unremarkable apart from the presence of diverticulosis.  (Prior procedure reports as noted above)     ROS: Cardiovascular:  no chest pain Respiratory: no dyspnea Remainder systems negative except as above The patient's Past Medical, Family and Social History were reviewed and are on file in the EMR. Past Medical History:  Diagnosis Date   Anxiety    Carcinoid tumor    Complication of anesthesia    felt strangled when woke up   Diverticulitis    Diverticulosis of colon 2012   GERD (gastroesophageal reflux disease)    History of hiatal hernia    History of kidney stones    Hyperlipidemia    Hypertension    IDA (iron deficiency anemia)    hx of iron def.   Poison oak dermatitis 2012    Past Surgical History:  Procedure Laterality Date   ABDOMINAL HYSTERECTOMY     CARDIAC CATHETERIZATION  09/05/03   EF 57%. NORMAL CORONARY ARTERIES. NO EVIDENCE OF RENAL ARTERY STENOSIS.   CHOLECYSTECTOMY  COLONOSCOPY N/A 06/14/2018   Procedure: COLONOSCOPY;  Surgeon: Malissa Hippo, MD;  Location: [facility];  Service: Endoscopy;  Laterality: N/A;  730   ESOPHAGOGASTRODUODENOSCOPY N/A 10/17/2015   Procedure:  ESOPHAGOGASTRODUODENOSCOPY (EGD);  Surgeon: Malissa Hippo, MD;  Location: [facility];  Service: Endoscopy;  Laterality: N/A;  3:00   LAPAROSCOPIC GASTRIC SLEEVE RESECTION N/A 05/05/2017   Procedure: LAPAROSCOPIC GASTRIC SLEEVE RESECTION, UPPER ENDO;  Surgeon: Luretha Murphy, MD;  Location: [facility];  Service: General;  Laterality: N/A;   MYOCARDIAL PERFUSION STUDY  03/30/09   NORMAL. EF 76%.   sleeve gastrectomy     05-05-18   TRANSTHORACIC ECHOCARDIOGRAM  10/10/08   NORMAL. EF => 55%.   Social history:  She reports significant stress worrying about and caring for several family members, some of whom have cancer. Non-smoker   Objective:  Med list reviewed  Current Outpatient Medications:    amLODipine (NORVASC) 5 MG tablet, TAKE 1 TABLET DAILY, Disp: 90 tablet, Rfl: 0   aspirin EC 81 MG tablet, Take 81 mg by mouth daily. Swallow whole., Disp: , Rfl:    fenofibrate (TRICOR) 48 MG tablet, Take 1 tablet (48 mg total) by mouth daily., Disp: 90 tablet, Rfl: 1   FLUoxetine (PROZAC) 40 MG capsule, Take 1 capsule (40 mg total) by mouth daily., Disp: 90 capsule, Rfl: 1   losartan-hydrochlorothiazide (HYZAAR) 100-25 MG tablet, Take 1 tablet by mouth daily., Disp: 7 tablet, Rfl: 0   Na Sulfate-K Sulfate-Mg Sulf 17.5-3.13-1.6 GM/177ML SOLN, Take 1 kit by mouth once for 1 dose., Disp: 354 mL, Rfl: 0   pantoprazole (PROTONIX) 20 MG tablet, Take 1 tablet (20 mg total) by mouth daily., Disp: 90 tablet, Rfl: 1   rosuvastatin (CRESTOR) 20 MG tablet, Take half tablet twice weekly, Disp: 90 tablet, Rfl: 3   Vital signs in last 24 hrs: Vitals:   04/08/23 0940  BP: 126/72  Pulse: 71  SpO2: 97%   Wt Readings from Last 3 Encounters:  04/08/23 225 lb (102.1 kg)  01/14/23 220 lb 1.9 oz (99.8 kg)  08/05/22 222 lb 2 oz (100.8 kg)    Physical Exam  Well-appearing HEENT: sclera anicteric, oral mucosa moist without lesions Neck: supple, no thyromegaly, JVD or lymphadenopathy Cardiac: Regular  without appreciable murmur,  no peripheral edema Pulm: clear to auscultation bilaterally, normal RR and effort noted Abdomen: soft, no tenderness, with active bowel sounds. No guarding or palpable hepatosplenomegaly. Skin; warm and dry, no jaundice or rash   Labs:     Latest Ref Rng & Units 12/19/2022    9:03 AM 10/25/2021    8:22 AM 02/20/2018    8:31 AM  CBC  WBC 3.4 - 10.8 x10E3/uL 5.6  6.8  6.7   Hemoglobin 11.1 - 15.9 g/dL 51.8  84.1  66.0   Hematocrit 34.0 - 46.6 % 37.5  38.5  39.5   Platelets 150 - 450 x10E3/uL 372  345  322     ___________________________________________ Radiologic studies:   ____________________________________________ Other:   _____________________________________________   Encounter Diagnoses  Name Primary?   Lower abdominal pain Yes   Altered bowel habits     Assessment and Plan    Recurrent Diverticulitis Multiple episodes of lower abdominal pain, bloating, and altered bowel habits. Recent episode in July 2024 treated with antibiotics. Uncertainty about whether all episodes are true diverticulitis or related to other conditions such as irritable bowel syndrome, subacute diverticular associated colitis (SCAD), or small intestine bacterial overgrowth.  (No SIBO  risk factors, and repeated antibiotic courses which seem likely to have made this better if it was the diagnosis) -Plan for colonoscopy to better understand the recurrent issue and evaluate for SCAD.  The benefits and risks of the planned procedure were described in detail with the patient or (when appropriate) their health care proxy.  Risks were outlined as including, but not limited to, bleeding, infection, perforation, adverse medication reaction leading to cardiac or pulmonary decompensation, pancreatitis (if ERCP).  The limitation of incomplete mucosal visualization was also discussed.  No guarantees or warranties were given.   Altered Bowel Habits Reports of constipation alternating  with loose stools, sometimes appearing like coffee grounds. Recently started over-the-counter probiotic which seems to have improved regularity and consistency of bowel movements. -Continue probiotic if she wishes to do so as it seems to be beneficial.  Lastly, as these could be some episodes of intestinal spasm or irritable bowel like condition with alternating bowel habits, I offered her a trial of antispasmodic medicine to have on hand.  She can take that as needed when 1 of these episodes occur because if it helps, perhaps we may be able to avoid some antibiotic treatments.  She wished to do that, so a prescription was sent for hyoscyamine.  32 minutes were spent on this encounter (including chart review, history/exam, counseling/coordination of care, and documentation) > 50% of that time was spent on counseling and coordination of care.   Caitlin Sampson

## 2023-04-08 ENCOUNTER — Encounter: Payer: Self-pay | Admitting: Gastroenterology

## 2023-04-08 ENCOUNTER — Ambulatory Visit: Payer: 59 | Admitting: Gastroenterology

## 2023-04-08 VITALS — BP 126/72 | HR 71 | Ht 62.0 in | Wt 225.0 lb

## 2023-04-08 DIAGNOSIS — R1030 Lower abdominal pain, unspecified: Secondary | ICD-10-CM | POA: Diagnosis not present

## 2023-04-08 DIAGNOSIS — R194 Change in bowel habit: Secondary | ICD-10-CM | POA: Diagnosis not present

## 2023-04-08 DIAGNOSIS — R140 Abdominal distension (gaseous): Secondary | ICD-10-CM

## 2023-04-08 MED ORDER — NA SULFATE-K SULFATE-MG SULF 17.5-3.13-1.6 GM/177ML PO SOLN: 1.0000 | Freq: Once | ORAL | 0 refills | Status: AC

## 2023-04-08 NOTE — Patient Instructions (Signed)
You have been scheduled for a colonoscopy. Please follow written instructions given to you at your visit today.   Please pick up your prep supplies at the pharmacy within the next 1-3 days.  If you use inhalers (even only as needed), please bring them with you on the day of your procedure.  DO NOT TAKE 7 DAYS PRIOR TO TEST- Trulicity (dulaglutide) Ozempic, Wegovy (semaglutide) Mounjaro (tirzepatide) Bydureon Bcise (exanatide extended release)  DO NOT TAKE 1 DAY PRIOR TO YOUR TEST Rybelsus (semaglutide) Adlyxin (lixisenatide) Victoza (liraglutide) Byetta (exanatide) ___________________________________________________________________________ _______________________________________________________  If your blood pressure at your visit was 140/90 or greater, please contact your primary care physician to follow up on this.  _______________________________________________________  If you are age 60 or older, your body mass index should be between 23-30. Your Body mass index is 41.15 kg/m. If this is out of the aforementioned range listed, please consider follow up with your Primary Care Provider.  If you are age 79 or younger, your body mass index should be between 19-25. Your Body mass index is 41.15 kg/m. If this is out of the aformentioned range listed, please consider follow up with your Primary Care Provider.   ________________________________________________________  The [facility] providers would like to encourage you to use [facility] to communicate with providers for non-urgent requests or questions.  Due to long hold times on the telephone, sending your provider a message by [facility] may be a faster and more efficient way to get a response.  Please allow 48 business hours for a response.  Please remember that this is for non-urgent requests.  _______________________________________________________ It was a pleasure to see you today!  Thank you for trusting me with your  gastrointestinal care!

## 2023-04-09 MED ORDER — HYOSCYAMINE SULFATE 0.125 MG SL SUBL: 0.1250 mg | SUBLINGUAL_TABLET | Freq: Four times a day (QID) | SUBLINGUAL | 2 refills | Status: DC | PRN

## 2023-04-09 NOTE — Addendum Note (Signed)
Addended by: Charlie Pitter on: 04/09/2023 05:32 AM   Modules accepted: Orders

## 2023-04-24 DIAGNOSIS — Z6841 Body Mass Index (BMI) 40.0 and over, adult: Secondary | ICD-10-CM | POA: Diagnosis not present

## 2023-04-24 DIAGNOSIS — Z01419 Encounter for gynecological examination (general) (routine) without abnormal findings: Secondary | ICD-10-CM | POA: Diagnosis not present

## 2023-04-24 DIAGNOSIS — N952 Postmenopausal atrophic vaginitis: Secondary | ICD-10-CM | POA: Diagnosis not present

## 2023-04-24 LAB — HM PAP SMEAR: HM Pap smear: NORMAL

## 2023-04-29 ENCOUNTER — Telehealth: Payer: Self-pay | Admitting: Family Medicine

## 2023-04-29 ENCOUNTER — Other Ambulatory Visit: Payer: Self-pay

## 2023-04-29 ENCOUNTER — Other Ambulatory Visit: Payer: Self-pay | Admitting: Family Medicine

## 2023-04-29 DIAGNOSIS — I10 Essential (primary) hypertension: Secondary | ICD-10-CM

## 2023-04-29 DIAGNOSIS — E782 Mixed hyperlipidemia: Secondary | ICD-10-CM

## 2023-04-29 DIAGNOSIS — R7303 Prediabetes: Secondary | ICD-10-CM

## 2023-04-29 DIAGNOSIS — D509 Iron deficiency anemia, unspecified: Secondary | ICD-10-CM

## 2023-04-29 NOTE — Telephone Encounter (Signed)
Patient aware.

## 2023-04-29 NOTE — Telephone Encounter (Signed)
PATIENT IS CALLING IN TO SEE IF SHE NEEDS LABS DONE BEFORE HER APPT AND IF SHE NEEDS TO FAST SHE WOULD LIKE A CALL BACK

## 2023-04-30 DIAGNOSIS — D509 Iron deficiency anemia, unspecified: Secondary | ICD-10-CM | POA: Diagnosis not present

## 2023-04-30 DIAGNOSIS — R7303 Prediabetes: Secondary | ICD-10-CM | POA: Diagnosis not present

## 2023-04-30 DIAGNOSIS — I10 Essential (primary) hypertension: Secondary | ICD-10-CM | POA: Diagnosis not present

## 2023-04-30 DIAGNOSIS — E782 Mixed hyperlipidemia: Secondary | ICD-10-CM | POA: Diagnosis not present

## 2023-05-01 LAB — CMP14+EGFR
ALT: 27 [IU]/L (ref 0–32)
AST: 26 [IU]/L (ref 0–40)
Albumin: 4.3 g/dL (ref 3.8–4.9)
Alkaline Phosphatase: 89 [IU]/L (ref 44–121)
BUN/Creatinine Ratio: 15 (ref 12–28)
BUN: 10 mg/dL (ref 8–27)
Bilirubin Total: 0.3 mg/dL (ref 0.0–1.2)
CO2: 26 mmol/L (ref 20–29)
Calcium: 10.6 mg/dL — ABNORMAL HIGH (ref 8.7–10.3)
Chloride: 102 mmol/L (ref 96–106)
Creatinine, Ser: 0.66 mg/dL (ref 0.57–1.00)
Globulin, Total: 2.3 g/dL (ref 1.5–4.5)
Glucose: 99 mg/dL (ref 70–99)
Potassium: 4 mmol/L (ref 3.5–5.2)
Sodium: 141 mmol/L (ref 134–144)
Total Protein: 6.6 g/dL (ref 6.0–8.5)
eGFR: 100 mL/min/{1.73_m2} (ref >59)

## 2023-05-01 LAB — HEMOGLOBIN A1C
Est. average glucose Bld gHb Est-mCnc: 134 mg/dL
Hgb A1c MFr Bld: 6.3 % — ABNORMAL HIGH (ref 4.8–5.6)

## 2023-05-01 LAB — LIPID PANEL
Chol/HDL Ratio: 2.9 {ratio} (ref 0.0–4.4)
Cholesterol, Total: 236 mg/dL — ABNORMAL HIGH (ref 100–199)
HDL: 82 mg/dL (ref >39)
LDL Chol Calc (NIH): 135 mg/dL — ABNORMAL HIGH (ref 0–99)
Triglycerides: 110 mg/dL (ref 0–149)
VLDL Cholesterol Cal: 19 mg/dL (ref 5–40)

## 2023-05-06 ENCOUNTER — Encounter: Payer: Self-pay | Admitting: Family Medicine

## 2023-05-06 ENCOUNTER — Ambulatory Visit: Payer: 59 | Admitting: Family Medicine

## 2023-05-06 VITALS — BP 135/84 | HR 84 | Ht 62.0 in | Wt 222.1 lb

## 2023-05-06 DIAGNOSIS — I10 Essential (primary) hypertension: Secondary | ICD-10-CM | POA: Diagnosis not present

## 2023-05-06 DIAGNOSIS — E785 Hyperlipidemia, unspecified: Secondary | ICD-10-CM | POA: Diagnosis not present

## 2023-05-06 DIAGNOSIS — R7303 Prediabetes: Secondary | ICD-10-CM

## 2023-05-06 DIAGNOSIS — F324 Major depressive disorder, single episode, in partial remission: Secondary | ICD-10-CM | POA: Diagnosis not present

## 2023-05-06 DIAGNOSIS — Z23 Encounter for immunization: Secondary | ICD-10-CM | POA: Diagnosis not present

## 2023-05-06 MED ORDER — PHENTERMINE HCL 37.5 MG PO TABS: 37.5000 mg | ORAL_TABLET | Freq: Every day | ORAL | 1 refills | Status: DC

## 2023-05-06 NOTE — Progress Notes (Unsigned)
SHADAY KNICELEY     MRN: [id number]      DOB: 1961/04/09  No chief complaint on file.   HPI Ms. Ketler is here for follow up and re-evaluation of chronic medical conditions, medication management and review of any available recent lab and radiology data.  Preventive health is updated, specifically  Cancer screening and Immunization.   Questions or concerns regarding consultations or procedures which the PT has had in the interim are  addressed. The PT denies any adverse reactions to current medications since the last visit.  There are no new concerns.  There are no specific complaints   ROS Denies recent fever or chills. Denies sinus pressure, nasal congestion, ear pain or sore throat. Denies chest congestion, productive cough or wheezing. Denies chest pains, palpitations and leg swelling Denies abdominal pain, nausea, vomiting,diarrhea or constipation.   Denies dysuria, frequency, hesitancy or incontinence. Denies joint pain, swelling and limitation in mobility. Denies headaches, seizures, numbness, or tingling. Denies depression, anxiety or insomnia. Denies skin break down or rash.   PE  There were no vitals taken for this visit.  Patient alert and oriented and in no cardiopulmonary distress.  HEENT: No facial asymmetry, EOMI,     Neck supple .  Chest: Clear to auscultation bilaterally.  CVS: S1, S2 no murmurs, no S3.Regular rate.  ABD: Soft non tender.   Ext: No edema  MS: Adequate ROM spine, shoulders, hips and knees.  Skin: Intact, no ulcerations or rash noted.  Psych: Good eye contact, normal affect. Memory intact not anxious or depressed appearing.  CNS: CN 2-12 intact, power,  normal throughout.no focal deficits noted.   Assessment & Plan  Prediabetes Patient educated about the importance of limiting  Carbohydrate intake , the need to commit to daily physical activity for a minimum of 30 minutes , and to commit weight loss. The fact that changes in all  these areas will reduce or eliminate all together the development of diabetes is stressed.      Latest Ref Rng & Units 04/30/2023    8:10 AM 12/31/2022    7:39 AM 12/19/2022    9:03 AM 04/28/2022    8:09 AM 10/25/2021    8:22 AM  Diabetic Labs  HbA1c 4.8 - 5.6 % 6.3  6.3   6.1  6.1   Chol 100 - 199 mg/dL 191  478   295  621   HDL >39 mg/dL 82  86   83  69   Calc LDL 0 - 99 mg/dL 308  657   846  962   Triglycerides 0 - 149 mg/dL 952  95   841  324   Creatinine 0.57 - 1.00 mg/dL 4.01   0.27  2.53  6.64       04/08/2023    9:40 AM 02/20/2023    8:37 AM 02/16/2023    2:09 PM 01/14/2023    8:34 AM 10/06/2022   12:39 PM 08/05/2022    2:08 PM 07/09/2022    8:24 AM  BP/Weight  Systolic BP 126 125 138 131 142 136 124  Diastolic BP 72 84 68 85 86 80 85  Wt. (Lbs) 225   220.12  222.13   BMI 41.15 kg/m2   40.26 kg/m2  40.63 kg/m2       Latest Ref Rng & Units 09/03/2022   12:00 AM 09/10/2021   12:00 AM  Foot/eye exam completion dates  Eye Exam No Retinopathy No Retinopathy     No  Retinopathy         This result is from an external source.    Unchanged  Hyperlipidemia LDL goal <100 Deteriorated Hyperlipidemia:Low fat diet discussed and encouraged.   Lipid Panel  Lab Results  Component Value Date   CHOL 236 (H) 04/30/2023   HDL 82 04/30/2023   LDLCALC 135 (H) 04/30/2023   TRIG 110 04/30/2023   CHOLHDL 2.9 04/30/2023

## 2023-05-06 NOTE — Assessment & Plan Note (Signed)
Patient educated about the importance of limiting  Carbohydrate intake , the need to commit to daily physical activity for a minimum of 30 minutes , and to commit weight loss. The fact that changes in all these areas will reduce or eliminate all together the development of diabetes is stressed.      Latest Ref Rng & Units 04/30/2023    8:10 AM 12/31/2022    7:39 AM 12/19/2022    9:03 AM 04/28/2022    8:09 AM 10/25/2021    8:22 AM  Diabetic Labs  HbA1c 4.8 - 5.6 % 6.3  6.3   6.1  6.1   Chol 100 - 199 mg/dL 425  956   387  564   HDL >39 mg/dL 82  86   83  69   Calc LDL 0 - 99 mg/dL 332  951   884  166   Triglycerides 0 - 149 mg/dL 063  95   016  010   Creatinine 0.57 - 1.00 mg/dL 9.32   3.55  7.32  2.02       04/08/2023    9:40 AM 02/20/2023    8:37 AM 02/16/2023    2:09 PM 01/14/2023    8:34 AM 10/06/2022   12:39 PM 08/05/2022    2:08 PM 07/09/2022    8:24 AM  BP/Weight  Systolic BP 126 125 138 131 142 136 124  Diastolic BP 72 84 68 85 86 80 85  Wt. (Lbs) 225   220.12  222.13   BMI 41.15 kg/m2   40.26 kg/m2  40.63 kg/m2       Latest Ref Rng & Units 09/03/2022   12:00 AM 09/10/2021   12:00 AM  Foot/eye exam completion dates  Eye Exam No Retinopathy No Retinopathy     No Retinopathy         This result is from an external source.    Unchanged

## 2023-05-06 NOTE — Patient Instructions (Addendum)
   F/U in 14 weeks, call if you need me soonber  CHANGE food choices as we discussed  It is important that you exercise regularly at least 30 minutes 5 times a week. If you develop chest pain, have severe difficulty breathing, or feel very tired, stop exercising immediately and seek medical attention   Tdap in office today  10 to 15 pound weight loss with half phentermine once daily ( script says whole tab, take HALF)  NO Labs for next visit, will get in 6 months  Best for 2025!  Thanks for choosing [facility], we consider it a privelige to serve you.

## 2023-05-06 NOTE — Assessment & Plan Note (Addendum)
Deteriorated Hyperlipidemia:Low fat diet discussed and encouraged.   Lipid Panel  Lab Results  Component Value Date   CHOL 236 (H) 04/30/2023   HDL 82 04/30/2023   LDLCALC 135 (H) 04/30/2023   TRIG 110 04/30/2023   CHOLHDL 2.9 04/30/2023

## 2023-05-27 DIAGNOSIS — Z23 Encounter for immunization: Secondary | ICD-10-CM | POA: Insufficient documentation

## 2023-05-27 NOTE — Assessment & Plan Note (Signed)
 Not adequately treated, would benefit from therapy, challenged by time and cost will continue current management , no med change

## 2023-05-27 NOTE — Assessment & Plan Note (Signed)
 Stable, PTH is normal also

## 2023-05-27 NOTE — Assessment & Plan Note (Signed)
  Patient re-educated about  the importance of commitment to a  minimum of 150 minutes of exercise per week as able.  The importance of healthy food choices with portion control discussed, as well as eating regularly and within a 12 hour window most days. The need to choose clean , green food 50 to 75% of the time is discussed, as well as to make water  the primary drink and set a goal of 64 ounces water  daily.       05/06/2023    8:01 AM 04/08/2023    9:40 AM 01/14/2023    8:34 AM  Weight /BMI  Weight 222 lb 1.9 oz 225 lb 220 lb 1.9 oz  Height 5' 2 (1.575 m) 5' 2 (1.575 m) 5' 2 (1.575 m)  BMI 40.63 kg/m2 41.15 kg/m2 40.26 kg/m2    Start half phentermine  daily and work on lifestyle changes, return in 14 weeks

## 2023-05-27 NOTE — Assessment & Plan Note (Signed)
 DASH diet and commitment to daily physical activity for a minimum of 30 minutes discussed and encouraged, as a part of hypertension management. The importance of attaining a healthy weight is also discussed.     05/06/2023    8:01 AM 04/08/2023    9:40 AM 02/20/2023    8:37 AM 02/16/2023    2:09 PM 01/14/2023    8:34 AM 10/06/2022   12:39 PM 08/05/2022    2:08 PM  BP/Weight  Systolic BP 135 126 125 138 131 142 136  Diastolic BP 84 72 84 68 85 86 80  Wt. (Lbs) 222.12 225   220.12  222.13  BMI 40.63 kg/m2 41.15 kg/m2   40.26 kg/m2  40.63 kg/m2     Sub  optimal control, weight loss and lifestyle change should address this

## 2023-06-02 ENCOUNTER — Encounter: Payer: Self-pay | Admitting: Gastroenterology

## 2023-06-04 ENCOUNTER — Telehealth: Payer: Self-pay | Admitting: Gastroenterology

## 2023-06-04 NOTE — Telephone Encounter (Signed)
Inbound call from patient stating she was recently prescribed phentermine medication. Requesting a call to discuss how to proceed with medication for 1/22 colonoscopy. Please advise, thank you.

## 2023-06-04 NOTE — Telephone Encounter (Signed)
The pt was started on phentermine recently and wanted to see if she needed to stop before the procedure.  She has been advised that she should be off for 10 days. She states the last dose was 05/31/23. She will remain off until after the procedure.

## 2023-06-05 ENCOUNTER — Encounter (HOSPITAL_COMMUNITY): Payer: Self-pay

## 2023-06-05 ENCOUNTER — Ambulatory Visit (HOSPITAL_COMMUNITY)
Admission: RE | Admit: 2023-06-05 | Discharge: 2023-06-05 | Disposition: A | Discharge status: Home / Self Care | Payer: 59 | Source: Ambulatory Visit | Attending: Family Medicine | Admitting: Family Medicine

## 2023-06-05 DIAGNOSIS — Z1231 Encounter for screening mammogram for malignant neoplasm of breast: Secondary | ICD-10-CM | POA: Insufficient documentation

## 2023-06-10 ENCOUNTER — Ambulatory Visit (AMBULATORY_SURGERY_CENTER): Payer: 59 | Admitting: Gastroenterology

## 2023-06-10 ENCOUNTER — Encounter: Payer: Self-pay | Admitting: Gastroenterology

## 2023-06-10 VITALS — BP 134/80 | HR 96 | Temp 97.7°F | Resp 21 | Ht 62.0 in | Wt 225.0 lb

## 2023-06-10 DIAGNOSIS — K5730 Diverticulosis of large intestine without perforation or abscess without bleeding: Secondary | ICD-10-CM

## 2023-06-10 DIAGNOSIS — R194 Change in bowel habit: Secondary | ICD-10-CM | POA: Diagnosis not present

## 2023-06-10 DIAGNOSIS — R1030 Lower abdominal pain, unspecified: Secondary | ICD-10-CM

## 2023-06-10 DIAGNOSIS — D123 Benign neoplasm of transverse colon: Secondary | ICD-10-CM

## 2023-06-10 DIAGNOSIS — I10 Essential (primary) hypertension: Secondary | ICD-10-CM | POA: Diagnosis not present

## 2023-06-10 DIAGNOSIS — F419 Anxiety disorder, unspecified: Secondary | ICD-10-CM | POA: Diagnosis not present

## 2023-06-10 DIAGNOSIS — E785 Hyperlipidemia, unspecified: Secondary | ICD-10-CM | POA: Diagnosis not present

## 2023-06-10 MED ORDER — SODIUM CHLORIDE 0.9 % IV SOLN: 500.0000 mL | Freq: Once | INTRAVENOUS | Status: DC

## 2023-06-10 NOTE — Op Note (Signed)
[facility] Patient Name: Caitlin Sampson Procedure Date: 06/10/2023 9:46 AM MRN: [id number] Endoscopist: Sherilyn Cooter L. Myrtie Neither , MD, [id number] Age: 63 Referring MD:  Date of Birth: 04-08-1961 Gender: Female Account #: [id number] Procedure:                Colonoscopy Indications:              Lower abdominal pain, Change in bowel habits                           Clinical details in November 2020 office note Medicines:                Monitored Anesthesia Care Procedure:                Pre-Anesthesia Assessment:                           - Prior to the procedure, a History and Physical                            was performed, and patient medications and                            allergies were reviewed. The patient's tolerance of                            previous anesthesia was also reviewed. The risks                            and benefits of the procedure and the sedation                            options and risks were discussed with the patient.                            All questions were answered, and informed consent                            was obtained. Prior Anticoagulants: The patient has                            taken no anticoagulant or antiplatelet agents. ASA                            Grade Assessment: II - A patient with mild systemic                            disease. After reviewing the risks and benefits,                            the patient was deemed in satisfactory condition to                            undergo the procedure.  After obtaining informed consent, the colonoscope                            was passed under direct vision. Throughout the                            procedure, the patient's blood pressure, pulse, and                            oxygen saturations were monitored continuously. The                            Olympus Scope SN: [id number] was introduced through                            the anus and  advanced to the the cecum, identified                            by appendiceal orifice and ileocecal valve. The                            colonoscopy was performed without difficulty. The                            patient tolerated the procedure well. The quality                            of the bowel preparation was good except the                            sigmoid colon was fair, with retained debris and                            diverticular stool balls. The ileocecal valve,                            appendiceal orifice, and rectum were photographed.                            The bowel preparation used was SUPREP. Scope In: 9:55:38 AM Scope Out: 10:07:57 AM Scope Withdrawal Time: 0 hours 10 minutes 6 seconds  Total Procedure Duration: 0 hours 12 minutes 19 seconds  Findings:                 The perianal and digital rectal examinations were                            normal.                           Repeat examination of right colon under NBI                            performed.  Two sessile polyps were found in the transverse                            colon. The polyps were 4 to 6 mm in size. These                            polyps were removed with a cold snare. Resection                            and retrieval were complete.                           Multiple diverticula were found in the left colon.                           The exam was otherwise without abnormality on                            direct and retroflexion views. Colonic mucosa                            normal. Specifically, no evidence of inflammation/                            SCAD in the left colon. Complications:            No immediate complications. Estimated Blood Loss:     Estimated blood loss was minimal. Impression:               - Two 4 to 6 mm polyps in the transverse colon,                            removed with a cold snare. Resected and retrieved.                            - Diverticulosis in the left colon.                           - The examination was otherwise normal on direct                            and retroflexion views.                           This patient may be recurrent episodes of                            diverticulitis. A trial of hyoscyamine was                            prescribed to be tried as needed for the next                            episode prior to considering antibiotics again. She  has not had an episode episode since last being                            seen in the office. Recommendation:           - Patient has a contact number available for                            emergencies. The signs and symptoms of potential                            delayed complications were discussed with the                            patient. Return to normal activities tomorrow.                            Written discharge instructions were provided to the                            patient.                           - Resume previous diet.                           - Continue present medications.                           - Await pathology results.                           - Repeat colonoscopy is recommended for                            surveillance. The colonoscopy date will be                            determined after pathology results from today's                            exam become available for review. (3 years if                            either polyp is adenomatous or SSP)                           For better prep on next colonoscopy, 10 mg of                            Dulcolax prior to evening prep dose, and consume                            more water with prep (see prep details above)                           -  See me again as needed Sherilyn Cooter L. Myrtie Neither, MD 06/10/2023 10:16:30 AM This report has been signed electronically.

## 2023-06-10 NOTE — Progress Notes (Signed)
History and Physical:  This patient presents for endoscopic testing for: Encounter Diagnoses  Name Primary?   Lower abdominal pain Yes   Altered bowel habits     Clinical details in Nov 2024 office consult note. No significant changes since then. Episodic lower abdominal pain and altered bowel habits.  ? Diverticulitis , ? IBS, ? SCAD  Patient is otherwise without complaints or active issues today.   Past Medical History: Past Medical History:  Diagnosis Date   Anxiety    Carcinoid tumor    Complication of anesthesia    felt strangled when woke up   Diverticulitis    Diverticulosis of colon 2012   GERD (gastroesophageal reflux disease)    History of hiatal hernia    History of kidney stones    Hyperlipidemia    Hypertension    IDA (iron deficiency anemia)    hx of iron def.   Poison oak dermatitis 2012     Past Surgical History: Past Surgical History:  Procedure Laterality Date   ABDOMINAL HYSTERECTOMY     CARDIAC CATHETERIZATION  09/05/03   EF 57%. NORMAL CORONARY ARTERIES. NO EVIDENCE OF RENAL ARTERY STENOSIS.   CHOLECYSTECTOMY     COLONOSCOPY N/A 06/14/2018   Procedure: COLONOSCOPY;  Surgeon: Malissa Hippo, MD;  Location: [facility];  Service: Endoscopy;  Laterality: N/A;  730   ESOPHAGOGASTRODUODENOSCOPY N/A 10/17/2015   Procedure: ESOPHAGOGASTRODUODENOSCOPY (EGD);  Surgeon: Malissa Hippo, MD;  Location: [facility];  Service: Endoscopy;  Laterality: N/A;  3:00   LAPAROSCOPIC GASTRIC SLEEVE RESECTION N/A 05/05/2017   Procedure: LAPAROSCOPIC GASTRIC SLEEVE RESECTION, UPPER ENDO;  Surgeon: Luretha Murphy, MD;  Location: [facility];  Service: General;  Laterality: N/A;   MYOCARDIAL PERFUSION STUDY  03/30/09   NORMAL. EF 76%.   sleeve gastrectomy     05-05-18   TRANSTHORACIC ECHOCARDIOGRAM  10/10/08   NORMAL. EF => 55%.    Allergies: Allergies  Allergen Reactions   Ivp Dye [Iodinated Contrast Media] Itching    Outpatient Meds: Current Outpatient  Medications  Medication Sig Dispense Refill   amLODipine (NORVASC) 5 MG tablet TAKE 1 TABLET DAILY 90 tablet 0   aspirin EC 81 MG tablet Take 81 mg by mouth daily. Swallow whole.     fenofibrate (TRICOR) 48 MG tablet TAKE 1 TABLET DAILY 90 tablet 1   FLUoxetine (PROZAC) 40 MG capsule TAKE 1 CAPSULE DAILY 90 capsule 1   losartan-hydrochlorothiazide (HYZAAR) 100-25 MG tablet TAKE 1 TABLET DAILY 90 tablet 1   pantoprazole (PROTONIX) 20 MG tablet Take 1 tablet (20 mg total) by mouth daily. 90 tablet 1   rosuvastatin (CRESTOR) 20 MG tablet Take half tablet twice weekly 90 tablet 3   hyoscyamine (LEVSIN SL) 0.125 MG SL tablet Place 1 tablet (0.125 mg total) under the tongue every 6 (six) hours as needed. 30 tablet 2   phentermine (ADIPEX-P) 37.5 MG tablet Take 1 tablet (37.5 mg total) by mouth daily before breakfast. 30 tablet 1   Current Facility-Administered Medications  Medication Dose Route Frequency Provider Last Rate Last Admin   0.9 %  sodium chloride infusion  500 mL Intravenous Once Sherrilyn Rist, MD          ___________________________________________________________________ Objective   Exam:  BP (!) 152/85   Pulse 90   Temp 97.7 F (36.5 C)   Ht 5\' 2"  (1.575 m)   Wt 225 lb (102.1 kg)   SpO2 96%   BMI 41.15 kg/m   CV:  regular , S1/S2 Resp: clear to auscultation bilaterally, normal RR and effort noted GI: soft, no tenderness, with active bowel sounds.   Assessment: Encounter Diagnoses  Name Primary?   Lower abdominal pain Yes   Altered bowel habits      Plan: Colonoscopy   The benefits and risks of the planned procedure were described in detail with the patient or (when appropriate) their health care proxy.  Risks were outlined as including, but not limited to, bleeding, infection, perforation, adverse medication reaction leading to cardiac or pulmonary decompensation, pancreatitis (if ERCP).  The limitation of incomplete mucosal visualization was also  discussed.  No guarantees or warranties were given.  The patient is appropriate for an endoscopic procedure in the ambulatory setting.   - Amada Jupiter, MD

## 2023-06-10 NOTE — Patient Instructions (Addendum)
Resume previous diet Continue present medications Await pathology results  Handouts/information given for polyps, diverticulosis   YOU HAD AN ENDOSCOPIC PROCEDURE TODAY AT THE [facility]:   Refer to the procedure report that was given to you for any specific questions about what was found during the examination.  If the procedure report does not answer your questions, please call your gastroenterologist to clarify.  If you requested that your care partner not be given the details of your procedure findings, then the procedure report has been included in a sealed envelope for you to review at your convenience later.  YOU SHOULD EXPECT: Some feelings of bloating in the abdomen. Passage of more gas than usual.  Walking can help get rid of the air that was put into your GI tract during the procedure and reduce the bloating. If you had a lower endoscopy (such as a colonoscopy or flexible sigmoidoscopy) you may notice spotting of blood in your stool or on the toilet paper. If you underwent a bowel prep for your procedure, you may not have a normal bowel movement for a few days.  Please Note:  You might notice some irritation and congestion in your nose or some drainage.  This is from the oxygen used during your procedure.  There is no need for concern and it should clear up in a day or so.  SYMPTOMS TO REPORT IMMEDIATELY:  Following lower endoscopy (colonoscopy or flexible sigmoidoscopy):  Excessive amounts of blood in the stool  Significant tenderness or worsening of abdominal pains  Swelling of the abdomen that is new, acute  Fever of 100F or higher  For urgent or emergent issues, a gastroenterologist can be reached at any hour by calling [phone]. Do not use MyChart messaging for urgent concerns.    DIET:  We do recommend a small meal at first, but then you may proceed to your regular diet.  Drink plenty of fluids but you should avoid alcoholic beverages for 24  hours.  ACTIVITY:  You should plan to take it easy for the rest of today and you should NOT DRIVE or use heavy machinery until tomorrow (because of the sedation medicines used during the test).    FOLLOW UP: Our staff will call the number listed on your records the next business day following your procedure.  We will call around 7:15- 8:00 am to check on you and address any questions or concerns that you may have regarding the information given to you following your procedure. If we do not reach you, we will leave a message.     If any biopsies were taken you will be contacted by phone or by letter within the next 1-3 weeks.  Please call us at [phone] if you have not heard about the biopsies in 3 weeks.    SIGNATURES/CONFIDENTIALITY: You and/or your care partner have signed paperwork which will be entered into your electronic medical record.  These signatures attest to the fact that that the information above on your After Visit Summary has been reviewed and is understood.  Full responsibility of the confidentiality of this discharge information lies with you and/or your care-partner. 

## 2023-06-10 NOTE — Progress Notes (Signed)
Called to room to assist during endoscopic procedure.  Patient ID and intended procedure confirmed with present staff. Received instructions for my participation in the procedure from the performing physician.  

## 2023-06-10 NOTE — Progress Notes (Signed)
Pt sedate, gd SR's, VSS, report to RN

## 2023-06-11 ENCOUNTER — Telehealth: Payer: Self-pay | Admitting: *Deleted

## 2023-06-11 NOTE — Telephone Encounter (Signed)
  Follow up Call-     06/10/2023    9:02 AM  Call back number  Post procedure Call Back phone  # [phone]  Permission to leave phone message Yes     Patient questions:  Do you have a fever, pain , or abdominal swelling? No. Pain Score  0 *  Have you tolerated food without any problems? Yes.    Have you been able to return to your normal activities? Yes.    Do you have any questions about your discharge instructions: Diet   No. Medications  No. Follow up visit  No.  Do you have questions or concerns about your Care? No.  Actions: * If pain score is 4 or above: No action needed, pain <4.

## 2023-06-12 LAB — SURGICAL PATHOLOGY

## 2023-06-15 ENCOUNTER — Encounter: Payer: Self-pay | Admitting: Gastroenterology

## 2023-08-04 ENCOUNTER — Other Ambulatory Visit: Payer: Self-pay | Admitting: Family Medicine

## 2023-08-12 ENCOUNTER — Ambulatory Visit: Payer: 59 | Admitting: Family Medicine

## 2023-10-19 ENCOUNTER — Ambulatory Visit
Admission: RE | Admit: 2023-10-19 | Discharge: 2023-10-19 | Disposition: A | Discharge status: Home / Self Care | Source: Ambulatory Visit | Attending: Nurse Practitioner | Admitting: Nurse Practitioner

## 2023-10-19 ENCOUNTER — Ambulatory Visit: Payer: Self-pay

## 2023-10-19 VITALS — BP 147/84 | HR 72 | Temp 98.8°F | Resp 16

## 2023-10-19 DIAGNOSIS — L255 Unspecified contact dermatitis due to plants, except food: Secondary | ICD-10-CM | POA: Diagnosis not present

## 2023-10-19 MED ORDER — TRIAMCINOLONE ACETONIDE 0.5 % EX OINT: 1.0000 | TOPICAL_OINTMENT | Freq: Two times a day (BID) | CUTANEOUS | 0 refills | Status: DC

## 2023-10-19 MED ORDER — METHYLPREDNISOLONE ACETATE 40 MG/ML IJ SUSP
40.0000 mg | Freq: Once | INTRAMUSCULAR | Status: AC
  Administered 2023-10-19: 40 mg via INTRAMUSCULAR

## 2023-10-19 MED ORDER — PREDNISONE 10 MG (21) PO TBPK
ORAL_TABLET | Freq: Every day | ORAL | 0 refills | Status: DC
Start: 2023-10-19 — End: 2024-03-30

## 2023-10-19 NOTE — Discharge Instructions (Addendum)
 We have given you an injection of steroid medication today to help with the itchy rash.  Start taking the oral prednisone  starting tomorrow. You can apply a very small amount of the steroid ointment to the areas that are most itchy but do not use on your face or in your groin/in between your breasts.  Seek care if rash does not improve with treatment.

## 2023-10-19 NOTE — ED Provider Notes (Signed)
 [facility]    CSN: [id number] Arrival date & time: 10/19/23  1313      History   Chief Complaint Chief Complaint  Patient presents with   Poison James Mcardle    Had for a week now and have tried everything but is getting worse. - Entered by patient   Rash    HPI Caitlin Sampson is a 63 y.o. female.   Patient presents today for itchy rash to arms, neck, back, face, in between breasts, and under left abdominal fold.  Thinks she has been exposed to poison ivy.  Rash is red, raised, itchy, and she feels that it is spreading.  No shortness of breath or throat/tongue swelling.  No recent change in any soaps, detergents, or personal care products.  Has tried topical calamine lotion and mother's triamcinolone  cream without relief.  Has also been taking Benadryl without much improvement.  No fever or nausea/vomiting.     Past Medical History:  Diagnosis Date   Anxiety    Carcinoid tumor    Complication of anesthesia    felt strangled when woke up   Diverticulitis    Diverticulosis of colon 2012   GERD (gastroesophageal reflux disease)    History of hiatal hernia    History of kidney stones    Hyperlipidemia    Hypertension    IDA (iron deficiency anemia)    hx of iron def.   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   History of rectal cancer 05/05/2018   Hypercalcemia 09/25/2017   S/P laparoscopic sleeve gastrectomy Dec 2018 05/05/2017   Rectal carcinoid tumor 08/19/2015   Internal hemorrhoids 08/19/2015   Diverticular disease of colon 08/19/2015   IDA (iron deficiency anemia) 08/14/2015   Essential hypertension 02/22/2014   Metabolic syndrome X 11/28/2012   GAD (generalized anxiety  disorder) 10/30/2011   Prediabetes 11/06/2010   Hyperlipidemia LDL goal <100 04/29/2006   GERD 04/29/2006   IBS 04/29/2006   CALCULUS, KIDNEY 04/29/2006    Past Surgical History:  Procedure Laterality Date   ABDOMINAL HYSTERECTOMY     CARDIAC CATHETERIZATION  09/05/03   EF 57%. NORMAL CORONARY ARTERIES. NO EVIDENCE OF RENAL ARTERY STENOSIS.   CHOLECYSTECTOMY     COLONOSCOPY N/A 06/14/2018   Procedure: COLONOSCOPY;  Surgeon: Ruby Corporal, MD;  Location: [facility];  Service: Endoscopy;  Laterality: N/A;  730   ESOPHAGOGASTRODUODENOSCOPY N/A 10/17/2015   Procedure: ESOPHAGOGASTRODUODENOSCOPY (EGD);  Surgeon: Ruby Corporal, MD;  Location: [facility];  Service: Endoscopy;  Laterality: N/A;  3:00   LAPAROSCOPIC GASTRIC SLEEVE RESECTION N/A 05/05/2017   Procedure: LAPAROSCOPIC GASTRIC SLEEVE RESECTION, UPPER ENDO;  Surgeon: Jacolyn Matar, MD;  Location: [facility];  Service: General;  Laterality: N/A;   MYOCARDIAL PERFUSION STUDY  03/30/09   NORMAL. EF 76%.   sleeve gastrectomy     05-05-18   TRANSTHORACIC ECHOCARDIOGRAM  10/10/08   NORMAL. EF => 55%.    OB History   No obstetric history on file.      Home Medications    Prior to Admission medications   Medication Sig Start Date End Date Taking? Authorizing Provider  amLODipine  (NORVASC ) 5 MG tablet TAKE 1 TABLET DAILY 08/04/23  Yes Towanda Fret, MD  aspirin  EC 81 MG tablet Take 81 mg by mouth daily. Swallow whole.   Yes [provider]  fenofibrate  (TRICOR ) 48 MG tablet TAKE 1 TABLET DAILY 08/04/23  Yes Towanda Fret, MD  FLUoxetine  (PROZAC ) 40 MG capsule TAKE 1 CAPSULE DAILY 08/04/23  Yes Towanda Fret, MD  losartan -hydrochlorothiazide  (HYZAAR) 100-25 MG tablet TAKE 1 TABLET DAILY 08/04/23  Yes Towanda Fret, MD  pantoprazole  (PROTONIX ) 20 MG tablet TAKE 1 TABLET DAILY 08/04/23  Yes Towanda Fret, MD  predniSONE  (STERAPRED UNI-PAK 21 TAB) 10 MG (21) TBPK tablet Take by mouth daily. Take  6 tabs by mouth daily for 2 days, then 5 tabs for 2 days, then 4 tabs for 2 days, then 3 tabs for 2 days, 2 tabs for 2 days, then 1 tab by mouth daily for 2 days 10/19/23  Yes Wilhemena Harbour, NP  rosuvastatin  (CRESTOR ) 20 MG tablet TAKE 1/2 TABLET 2 TIMES A  WEEK 08/04/23  Yes Towanda Fret, MD  triamcinolone  ointment (KENALOG ) 0.5 % Apply 1 Application topically 2 (two) times daily. Apply sparingly to rash as needed for itching; avoid use on face, groin 10/19/23  Yes Thena Fireman A, NP  hyoscyamine  (LEVSIN SL) 0.125 MG SL tablet Place 1 tablet (0.125 mg total) under the tongue every 6 (six) hours as needed. 04/09/23   Albertina Hugger, MD  phentermine  (ADIPEX-P ) 37.5 MG tablet Take 1 tablet (37.5 mg total) by mouth daily before breakfast. 05/06/23   Towanda Fret, MD    Family History Family History  Problem Relation Age of Onset   Heart disease Mother        a fib   Kidney disease Mother    Lung cancer Mother    Heart disease Father        MI   Hyperlipidemia Father    Hypertension Father    Kidney disease Father    Kidney disease Brother    Diabetes Maternal Grandmother    Stroke Maternal Grandmother    Colon cancer Other    Esophageal cancer Neg Hx    Rectal cancer Neg Hx    Stomach cancer Neg Hx     Social History Social History   Tobacco Use   Smoking status: Former   Smokeless tobacco: Never   Tobacco comments:    quit smoking for over 29 yrs.   Vaping Use   Vaping status: Never Used  Substance Use Topics   Alcohol use: No    Alcohol/week: 0.0 standard drinks of alcohol   Drug use: No     Allergies   Ivp dye [iodinated contrast media]   Review of Systems Review of Systems Per HPI  Physical Exam Triage Vital Signs ED Triage Vitals  Encounter Vitals Group     BP 10/19/23 1331 (!) 147/84     Systolic BP Percentile --      Diastolic BP Percentile --      Pulse Rate 10/19/23 1331 72     Resp 10/19/23 1331 16     Temp 10/19/23 1331  98.8 F (37.1 C)     Temp Source 10/19/23 1331 Oral     SpO2 10/19/23 1331 94 %     Weight --      Height --      Head Circumference --      Peak Flow --      Pain Score 10/19/23 1332 0     Pain  Loc --      Pain Education --      Exclude from Growth Chart --    No data found.  Updated Vital Signs BP (!) 147/84 (BP Location: Right Arm)   Pulse 72   Temp 98.8 F (37.1 C) (Oral)   Resp 16   SpO2 94%   Visual Acuity Right Eye Distance:   Left Eye Distance:   Bilateral Distance:    Right Eye Near:   Left Eye Near:    Bilateral Near:     Physical Exam Vitals and nursing note reviewed.  Constitutional:      General: She is not in acute distress.    Appearance: Normal appearance. She is not toxic-appearing.  HENT:     Mouth/Throat:     Mouth: Mucous membranes are moist.     Pharynx: Oropharynx is clear.  Pulmonary:     Effort: Pulmonary effort is normal. No respiratory distress.  Skin:    General: Skin is warm and dry.     Capillary Refill: Capillary refill takes less than 2 seconds.     Findings: Rash present. Rash is macular and papular.     Comments: Erythematous, maculopapular rash in patches to face, neck, bilateral arms, left pannus, in between breasts, and upper back.  No active drainage, oozing, or warmth.    Neurological:     Mental Status: She is alert and oriented to person, place, and time.  Psychiatric:        Behavior: Behavior is cooperative.      UC Treatments / Results  Labs (all labs ordered are listed, but only abnormal results are displayed) Labs Reviewed - No data to display  EKG   Radiology No results found.  Procedures Procedures (including critical care time)  Medications Ordered in UC Medications  methylPREDNISolone  acetate (DEPO-MEDROL ) injection 40 mg (40 mg Intramuscular Given 10/19/23 1357)    Initial Impression / Assessment and Plan / UC Course  I have reviewed the triage vital signs and the nursing notes.  Pertinent  labs & imaging results that were available during my care of the patient were reviewed by me and considered in my medical decision making (see chart for details).   Patient is well-appearing, normotensive, afebrile, not tachycardic, not tachypneic, oxygenating well on room air.   1. Rhus dermatitis No red flags Treat with Depo Medrol  40 mg IM in urgent care today and start oral prednisone  taper tomorrow Also recommended topical triamcinolone  in non sensitive areas as needed for itch Continue oral antihistamine regimen Return and ER precautions discussed with patient  The patient was given the opportunity to ask questions.  All questions answered to their satisfaction.  The patient is in agreement to this plan.  Final Clinical Impressions(s) / UC Diagnoses   Final diagnoses:  Rhus dermatitis     Discharge Instructions      We have given you an injection of steroid medication today to help with the itchy rash.  Start taking the oral prednisone  starting tomorrow. You can apply a very small amount of the steroid ointment to the areas that are most itchy but do not use on your face or in your groin/in between your breasts.  Seek care if rash does not improve with treatment.   ED Prescriptions     Medication Sig Dispense Auth. Provider   predniSONE  (STERAPRED UNI-PAK 21 TAB) 10 MG (21) TBPK tablet Take by mouth daily. Take 6 tabs by mouth daily for 2 days, then 5  tabs for 2 days, then 4 tabs for 2 days, then 3 tabs for 2 days, 2 tabs for 2 days, then 1 tab by mouth daily for 2 days 42 tablet Thena Fireman A, NP   triamcinolone  ointment (KENALOG ) 0.5 % Apply 1 Application topically 2 (two) times daily. Apply sparingly to rash as needed for itching; avoid use on face, groin 15 g Wilhemena Harbour, NP      PDMP not reviewed this encounter.   Wilhemena Harbour, NP 10/19/23 1432

## 2023-10-19 NOTE — Telephone Encounter (Signed)
 Chief Complaint: poison ivy Symptoms: rash, itchy Frequency: x 1 week Pertinent Negatives: Patient denies fever Disposition: [] ED /[x] Urgent Care (no appt availability in office) / [] Appointment(In office/virtual)/ []  [facility]/ [] Home Care/ [] Refused Recommended Disposition /[]  Mobile Bus/ []  Follow-up with PCP  Additional Notes: Pt states that she has come in contact with poison ivy last week and tried OTC medications and it not helping. States its on her face, breast,left shoulder and abd and legs. States that she has had it before. States that they were spreading pine needles last week. No appt available, offered virtual UC pt declined and states she wants a shot for it, suggested UC.   Reason for Disposition  [1] Severe poison ivy, oak, or sumac reaction in the past AND [2] face or genitals involved  Protocols used: Poison Ivy - Oak - Sumac-A-AH

## 2023-10-19 NOTE — ED Triage Notes (Signed)
 Red itchy rash on chest, groin, face and back x 1 week. Pt thinks it may be poison ivy. Tried OTC calamine lotion, anti itching cream, trimetozine with no relief of symptoms.

## 2023-10-19 NOTE — Telephone Encounter (Signed)
Noted, patient advised to go to urgent care

## 2023-11-10 ENCOUNTER — Other Ambulatory Visit: Payer: Self-pay | Admitting: Family Medicine

## 2024-02-10 ENCOUNTER — Other Ambulatory Visit: Payer: Self-pay | Admitting: Family Medicine

## 2024-02-15 ENCOUNTER — Other Ambulatory Visit: Payer: Self-pay | Admitting: Family Medicine

## 2024-02-15 MED ORDER — PANTOPRAZOLE SODIUM 20 MG PO TBEC: 20.0000 mg | DELAYED_RELEASE_TABLET | Freq: Every day | ORAL | 0 refills | Status: DC

## 2024-02-15 MED ORDER — FENOFIBRATE 48 MG PO TABS: 48.0000 mg | ORAL_TABLET | Freq: Every day | ORAL | 0 refills | Status: DC

## 2024-02-15 MED ORDER — AMLODIPINE BESYLATE 5 MG PO TABS: 5.0000 mg | ORAL_TABLET | Freq: Every day | ORAL | 0 refills | Status: DC

## 2024-02-15 MED ORDER — LOSARTAN POTASSIUM-HCTZ 100-25 MG PO TABS: 1.0000 | ORAL_TABLET | Freq: Every day | ORAL | 0 refills | Status: DC

## 2024-02-15 NOTE — Telephone Encounter (Signed)
 Copied from CRM [id number]. Topic: Clinical - Medication Refill >> Feb 15, 2024  2:59 PM Roselie C wrote: Medication: losartan -hydrochlorothiazide  (HYZAAR) 100-25 MG tablet, amLODipine  (NORVASC ) 5 MG tablet,FLUoxetine  (PROZAC ) 40 MG capsule,  e-pantoprazole  (PROTONIX ) 20 MG tablet,  fenofibrate  (TRICOR ) 48 MG tablet   Has the patient contacted their pharmacy? Yes (Agent: If no, request that the patient contact the pharmacy for the refill. If patient does not wish to contact the pharmacy document the reason why and proceed with request.) (Agent: If yes, when and what did the pharmacy advise?)  This is the patient's preferred pharmacy:   [facility] - [place], [place] - [facility] AT Portal to Registered [employer] [address] [place] [address] Phone: [phone] Fax: [phone]   Is this the correct pharmacy for this prescription? Yes If no, delete pharmacy and type the correct one.   Has the prescription been filled recently? No  Is the patient out of the medication? Yes  Has the patient been seen for an appointment in the last year OR does the patient have an upcoming appointment? Yes  Can we respond through MyChart? Yes  Agent: Please be advised that Rx refills may take up to 3 business days. We ask that you follow-up with your pharmacy.

## 2024-02-18 ENCOUNTER — Other Ambulatory Visit: Payer: Self-pay

## 2024-02-18 ENCOUNTER — Telehealth: Payer: Self-pay | Admitting: Family Medicine

## 2024-02-18 MED ORDER — FLUOXETINE HCL 40 MG PO CAPS: 40.0000 mg | ORAL_CAPSULE | Freq: Every day | ORAL | 1 refills | Status: AC

## 2024-02-18 NOTE — Telephone Encounter (Signed)
 Prescription Request  02/18/2024  LOV: 05/06/2023  What is the name of the medication or equipment? FLUoxetine  (PROZAC ) 40 MG capsule [[id number]]   Have you contacted your pharmacy to request a refill? Yes   Which pharmacy would you like this sent to?   [facility] - [place], [place] - [facility] AT Portal to Registered [address] [place], [place] [address] Phone: [phone]  Fax: [phone] DEA #: --  DAW Reason: --    Patient notified that their request is being sent to the clinical staff for review and that they should receive a response within 2 business days.   Please advise at Mobile [phone] (mobile)

## 2024-02-18 NOTE — Telephone Encounter (Signed)
 Sent!

## 2024-02-25 NOTE — Progress Notes (Signed)
 Caitlin Sampson                                          MRN: [id number]   02/25/2024   The VBCI Quality Team Specialist reviewed this patient medical record for the purposes of chart review for care gap closure. The following were reviewed: chart review for care gap closure-controlling blood pressure.    VBCI Quality Team

## 2024-03-30 ENCOUNTER — Emergency Department (HOSPITAL_COMMUNITY): Admitting: Anesthesiology

## 2024-03-30 ENCOUNTER — Encounter (HOSPITAL_COMMUNITY): Payer: Self-pay | Admitting: *Deleted

## 2024-03-30 ENCOUNTER — Ambulatory Visit (HOSPITAL_COMMUNITY)
Admission: EM | Admit: 2024-03-30 | Discharge: 2024-03-30 | Disposition: A | Discharge status: Home / Self Care | Attending: General Surgery | Admitting: General Surgery

## 2024-03-30 ENCOUNTER — Ambulatory Visit: Payer: Self-pay

## 2024-03-30 ENCOUNTER — Encounter (HOSPITAL_COMMUNITY): Payer: Self-pay | Admitting: Emergency Medicine

## 2024-03-30 ENCOUNTER — Ambulatory Visit: Admitting: Family Medicine

## 2024-03-30 ENCOUNTER — Encounter: Payer: Self-pay | Admitting: Family Medicine

## 2024-03-30 ENCOUNTER — Other Ambulatory Visit: Payer: Self-pay

## 2024-03-30 ENCOUNTER — Encounter (HOSPITAL_COMMUNITY)
Admission: EM | Disposition: A | Discharge status: Home / Self Care | Payer: Self-pay | Source: Home / Self Care | Attending: Emergency Medicine

## 2024-03-30 ENCOUNTER — Emergency Department (HOSPITAL_COMMUNITY)

## 2024-03-30 VITALS — BP 130/75 | HR 78 | Temp 98.7°F | Resp 18 | Ht 62.0 in | Wt 232.1 lb

## 2024-03-30 DIAGNOSIS — R110 Nausea: Secondary | ICD-10-CM | POA: Insufficient documentation

## 2024-03-30 DIAGNOSIS — R6883 Chills (without fever): Secondary | ICD-10-CM | POA: Insufficient documentation

## 2024-03-30 DIAGNOSIS — F419 Anxiety disorder, unspecified: Secondary | ICD-10-CM | POA: Diagnosis not present

## 2024-03-30 DIAGNOSIS — K5730 Diverticulosis of large intestine without perforation or abscess without bleeding: Secondary | ICD-10-CM | POA: Diagnosis not present

## 2024-03-30 DIAGNOSIS — K3533 Acute appendicitis with perforation and localized peritonitis, with abscess: Secondary | ICD-10-CM | POA: Insufficient documentation

## 2024-03-30 DIAGNOSIS — K353 Acute appendicitis with localized peritonitis, without perforation or gangrene: Secondary | ICD-10-CM

## 2024-03-30 DIAGNOSIS — N200 Calculus of kidney: Secondary | ICD-10-CM | POA: Diagnosis not present

## 2024-03-30 DIAGNOSIS — Z87891 Personal history of nicotine dependence: Secondary | ICD-10-CM | POA: Diagnosis not present

## 2024-03-30 DIAGNOSIS — I10 Essential (primary) hypertension: Secondary | ICD-10-CM | POA: Diagnosis not present

## 2024-03-30 DIAGNOSIS — K3580 Unspecified acute appendicitis: Secondary | ICD-10-CM | POA: Diagnosis not present

## 2024-03-30 DIAGNOSIS — Z6841 Body Mass Index (BMI) 40.0 and over, adult: Secondary | ICD-10-CM | POA: Insufficient documentation

## 2024-03-30 DIAGNOSIS — E66813 Obesity, class 3: Secondary | ICD-10-CM | POA: Insufficient documentation

## 2024-03-30 DIAGNOSIS — R1031 Right lower quadrant pain: Secondary | ICD-10-CM | POA: Diagnosis not present

## 2024-03-30 DIAGNOSIS — R109 Unspecified abdominal pain: Secondary | ICD-10-CM | POA: Diagnosis not present

## 2024-03-30 HISTORY — PX: XI ROBOTIC LAPAROSCOPIC ASSISTED APPENDECTOMY: SHX6877

## 2024-03-30 LAB — URINALYSIS, ROUTINE W REFLEX MICROSCOPIC
Bilirubin Urine: NEGATIVE
Glucose, UA: NEGATIVE mg/dL
Hgb urine dipstick: NEGATIVE
Ketones, ur: NEGATIVE mg/dL
Leukocytes,Ua: NEGATIVE
Nitrite: NEGATIVE
Protein, ur: NEGATIVE mg/dL
Specific Gravity, Urine: 1.017 (ref 1.005–1.030)
pH: 7 (ref 5.0–8.0)

## 2024-03-30 LAB — CBC
HCT: 38 % (ref 36.0–46.0)
Hemoglobin: 12.5 g/dL (ref 12.0–15.0)
MCH: 28 pg (ref 26.0–34.0)
MCHC: 32.9 g/dL (ref 30.0–36.0)
MCV: 85.2 fL (ref 80.0–100.0)
Platelets: 366 K/uL (ref 150–400)
RBC: 4.46 MIL/uL (ref 3.87–5.11)
RDW: 14.6 % (ref 11.5–15.5)
WBC: 14.6 K/uL — ABNORMAL HIGH (ref 4.0–10.5)
nRBC: 0 % (ref 0.0–0.2)

## 2024-03-30 LAB — COMPREHENSIVE METABOLIC PANEL WITH GFR
ALT: 49 U/L — ABNORMAL HIGH (ref 0–44)
AST: 41 U/L (ref 15–41)
Albumin: 4.4 g/dL (ref 3.5–5.0)
Alkaline Phosphatase: 77 U/L (ref 38–126)
Anion gap: 12 (ref 5–15)
BUN: 8 mg/dL (ref 8–23)
CO2: 25 mmol/L (ref 22–32)
Calcium: 10.6 mg/dL — ABNORMAL HIGH (ref 8.9–10.3)
Chloride: 98 mmol/L (ref 98–111)
Creatinine, Ser: 0.57 mg/dL (ref 0.44–1.00)
GFR, Estimated: >60 mL/min (ref >60)
Glucose, Bld: 118 mg/dL — ABNORMAL HIGH (ref 70–99)
Potassium: 3.8 mmol/L (ref 3.5–5.1)
Sodium: 135 mmol/L (ref 135–145)
Total Bilirubin: 0.4 mg/dL (ref 0.0–1.2)
Total Protein: 7 g/dL (ref 6.5–8.1)

## 2024-03-30 LAB — LIPASE, BLOOD: Lipase: 15 U/L (ref 11–51)

## 2024-03-30 SURGERY — APPENDECTOMY, ROBOT-ASSISTED, LAPAROSCOPIC
Anesthesia: General

## 2024-03-30 MED ORDER — MIDAZOLAM HCL 2 MG/2ML IJ SOLN
INTRAMUSCULAR | Status: AC
  Filled 2024-03-30: qty 2

## 2024-03-30 MED ORDER — PROPOFOL 10 MG/ML IV BOLUS
INTRAVENOUS | Status: DC | PRN
  Administered 2024-03-30: 200 mg via INTRAVENOUS

## 2024-03-30 MED ORDER — CHLORHEXIDINE GLUCONATE CLOTH 2 % EX PADS: 6.0000 | MEDICATED_PAD | Freq: Once | CUTANEOUS | Status: DC

## 2024-03-30 MED ORDER — DIPHENHYDRAMINE HCL 25 MG PO CAPS
50.0000 mg | ORAL_CAPSULE | Freq: Once | ORAL | Status: AC
  Administered 2024-03-30: 50 mg via ORAL
  Filled 2024-03-30: qty 2

## 2024-03-30 MED ORDER — BUPIVACAINE HCL (PF) 0.5 % IJ SOLN
INTRAMUSCULAR | Status: AC
Start: 2024-03-30 — End: 2024-03-30
  Filled 2024-03-30: qty 30

## 2024-03-30 MED ORDER — SODIUM CHLORIDE 0.9 % IV SOLN
2.0000 g | Freq: Once | INTRAVENOUS | Status: AC
  Administered 2024-03-30: 2 g via INTRAVENOUS
  Filled 2024-03-30: qty 20

## 2024-03-30 MED ORDER — ROCURONIUM BROMIDE 100 MG/10ML IV SOLN
INTRAVENOUS | Status: DC | PRN
  Administered 2024-03-30: 30 mg via INTRAVENOUS
  Administered 2024-03-30: 10 mg via INTRAVENOUS

## 2024-03-30 MED ORDER — LACTATED RINGERS IV SOLN: INTRAVENOUS | Status: DC | PRN

## 2024-03-30 MED ORDER — BUPIVACAINE HCL (PF) 0.5 % IJ SOLN
INTRAMUSCULAR | Status: DC | PRN
  Administered 2024-03-30: 30 mL

## 2024-03-30 MED ORDER — MIDAZOLAM HCL 5 MG/5ML IJ SOLN
INTRAMUSCULAR | Status: DC | PRN
  Administered 2024-03-30: 2 mg via INTRAVENOUS

## 2024-03-30 MED ORDER — SUCCINYLCHOLINE CHLORIDE 200 MG/10ML IV SOSY
PREFILLED_SYRINGE | INTRAVENOUS | Status: AC
Start: 2024-03-30 — End: 2024-03-30
  Filled 2024-03-30: qty 10

## 2024-03-30 MED ORDER — ROCURONIUM BROMIDE 10 MG/ML (PF) SYRINGE
PREFILLED_SYRINGE | INTRAVENOUS | Status: AC
Start: 2024-03-30 — End: 2024-03-30
  Filled 2024-03-30: qty 10

## 2024-03-30 MED ORDER — OXYCODONE HCL 5 MG PO TABS: 5.0000 mg | ORAL_TABLET | ORAL | 0 refills | Status: AC | PRN

## 2024-03-30 MED ORDER — PREDNISONE 50 MG PO TABS
50.0000 mg | ORAL_TABLET | Freq: Four times a day (QID) | ORAL | Status: DC
  Administered 2024-03-30: 50 mg via ORAL
  Filled 2024-03-30: qty 1

## 2024-03-30 MED ORDER — FENTANYL CITRATE (PF) 100 MCG/2ML IJ SOLN
INTRAMUSCULAR | Status: DC | PRN
  Administered 2024-03-30: 100 ug via INTRAVENOUS

## 2024-03-30 MED ORDER — CHLORHEXIDINE GLUCONATE 0.12 % MT SOLN
OROMUCOSAL | Status: AC
  Filled 2024-03-30: qty 15

## 2024-03-30 MED ORDER — SODIUM CHLORIDE 0.9 % IV SOLN: INTRAVENOUS | Status: DC

## 2024-03-30 MED ORDER — ONDANSETRON HCL 4 MG/2ML IJ SOLN: 4.0000 mg | Freq: Once | INTRAMUSCULAR | Status: DC | PRN

## 2024-03-30 MED ORDER — KETOROLAC TROMETHAMINE 30 MG/ML IJ SOLN
30.0000 mg | Freq: Once | INTRAMUSCULAR | Status: AC
  Administered 2024-03-30: 30 mg via INTRAVENOUS
  Filled 2024-03-30: qty 1

## 2024-03-30 MED ORDER — 0.9 % SODIUM CHLORIDE (POUR BTL) OPTIME
TOPICAL | Status: DC | PRN
  Administered 2024-03-30: 1000 mL

## 2024-03-30 MED ORDER — ONDANSETRON HCL 4 MG/2ML IJ SOLN
4.0000 mg | Freq: Once | INTRAMUSCULAR | Status: AC
  Administered 2024-03-30: 4 mg via INTRAVENOUS
  Filled 2024-03-30: qty 2

## 2024-03-30 MED ORDER — OXYCODONE HCL 5 MG/5ML PO SOLN: 5.0000 mg | Freq: Once | ORAL | Status: DC | PRN

## 2024-03-30 MED ORDER — SUCCINYLCHOLINE CHLORIDE 200 MG/10ML IV SOSY
PREFILLED_SYRINGE | INTRAVENOUS | Status: DC | PRN
  Administered 2024-03-30: 100 mg via INTRAVENOUS

## 2024-03-30 MED ORDER — METRONIDAZOLE 500 MG/100ML IV SOLN
500.0000 mg | Freq: Once | INTRAVENOUS | Status: DC
Start: 2024-03-30 — End: 2024-03-31
  Filled 2024-03-30: qty 100

## 2024-03-30 MED ORDER — MORPHINE SULFATE (PF) 4 MG/ML IV SOLN
4.0000 mg | Freq: Once | INTRAVENOUS | Status: AC
  Administered 2024-03-30: 4 mg via INTRAVENOUS
  Filled 2024-03-30: qty 1

## 2024-03-30 MED ORDER — OXYCODONE HCL 5 MG PO TABS: 5.0000 mg | ORAL_TABLET | Freq: Once | ORAL | Status: DC | PRN

## 2024-03-30 MED ORDER — FENTANYL CITRATE (PF) 100 MCG/2ML IJ SOLN
INTRAMUSCULAR | Status: AC
  Filled 2024-03-30: qty 2

## 2024-03-30 MED ORDER — FENTANYL CITRATE (PF) 50 MCG/ML IJ SOSY: 25.0000 ug | PREFILLED_SYRINGE | INTRAMUSCULAR | Status: DC | PRN

## 2024-03-30 SURGICAL SUPPLY — 40 items
CANNULA REDUCER 12-8 DVNC XI (CANNULA) ×1 IMPLANT
CHLORAPREP W/TINT 26 (MISCELLANEOUS) ×1 IMPLANT
COVER MAYO STAND XLG (MISCELLANEOUS) ×1 IMPLANT
DERMABOND ADVANCED .7 DNX12 (GAUZE/BANDAGES/DRESSINGS) ×1 IMPLANT
DERMABOND ADVANCED .7 DNX6 (GAUZE/BANDAGES/DRESSINGS) IMPLANT
DRAPE ARM DVNC X/XI (DISPOSABLE) ×3 IMPLANT
DRAPE COLUMN DVNC XI (DISPOSABLE) ×1 IMPLANT
ELECTRODE REM PT RTRN 9FT ADLT (ELECTROSURGICAL) ×1 IMPLANT
FORCEPS BPLR R/ABLATION 8 DVNC (INSTRUMENTS) ×1 IMPLANT
GAUZE 4X4 16PLY ~~LOC~~+RFID DBL (SPONGE) ×1 IMPLANT
GLOVE BIO SURGEON STRL SZ7 (GLOVE) IMPLANT
GLOVE BIOGEL M 6.5 STRL (GLOVE) IMPLANT
GLOVE BIOGEL PI IND STRL 6.5 (GLOVE) IMPLANT
GLOVE BIOGEL PI IND STRL 7.0 (GLOVE) ×2 IMPLANT
GLOVE SURG SS PI 7.5 STRL IVOR (GLOVE) ×2 IMPLANT
GOWN STRL REUS W/TWL LRG LVL3 (GOWN DISPOSABLE) ×3 IMPLANT
IRRIGATOR SUCT 8 DISP DVNC XI (IRRIGATION / IRRIGATOR) IMPLANT
KIT PINK PAD W/HEAD ARM REST (MISCELLANEOUS) ×1 IMPLANT
KIT TURNOVER KIT A (KITS) ×1 IMPLANT
MANIFOLD NEPTUNE II (INSTRUMENTS) ×1 IMPLANT
NDL HYPO 21X1.5 SAFETY (NEEDLE) ×1 IMPLANT
NDL INSUFFLATION 14GA 120MM (NEEDLE) ×1 IMPLANT
NEEDLE HYPO 21X1.5 SAFETY (NEEDLE) ×1 IMPLANT
NEEDLE INSUFFLATION 14GA 120MM (NEEDLE) ×1 IMPLANT
NS IRRIG 500ML POUR BTL (IV SOLUTION) ×1 IMPLANT
OBTURATOR OPTICALSTD 8 DVNC (TROCAR) ×1 IMPLANT
PACK LAP CHOLE LZT030E (CUSTOM PROCEDURE TRAY) ×1 IMPLANT
PAD ARMBOARD POSITIONER FOAM (MISCELLANEOUS) ×1 IMPLANT
PENCIL HANDSWITCHING (ELECTRODE) ×1 IMPLANT
POSITIONER HEAD 8X9X4 ADT (SOFTGOODS) ×1 IMPLANT
RELOAD STAPLE 30 2.5 WHT DVNC (STAPLE) ×1 IMPLANT
SEAL UNIV 5-12 XI (MISCELLANEOUS) ×3 IMPLANT
SEALER VESSEL EXT DVNC XI (MISCELLANEOUS) IMPLANT
SET TUBE SMOKE EVAC HIGH FLOW (TUBING) IMPLANT
STAPLER 30 CRVD 8 SUREFORM (STAPLE) ×1 IMPLANT
SUT MNCRL AB 4-0 PS2 18 (SUTURE) ×1 IMPLANT
SYR 30ML LL (SYRINGE) ×1 IMPLANT
SYSTEM RETRIEVL 5MM INZII UNIV (BASKET) ×1 IMPLANT
TAPE TRANSPORE STRL 2 31045 (GAUZE/BANDAGES/DRESSINGS) ×1 IMPLANT
WATER STERILE IRR 500ML POUR (IV SOLUTION) ×1 IMPLANT

## 2024-03-30 NOTE — Transfer of Care (Signed)
 Immediate Anesthesia Transfer of Care Note  Patient: Caitlin Sampson  Procedure(s) Performed: APPENDECTOMY, ROBOT-ASSISTED, LAPAROSCOPIC  Patient Location: PACU  Anesthesia Type:General  Level of Consciousness: awake and alert   Airway & Oxygen Therapy: Patient Spontanous Breathing and Patient connected to face mask oxygen  Post-op Assessment: Report given to RN and Post -op Vital signs reviewed and stable  Post vital signs: Reviewed and stable  Last Vitals:  Vitals Value Taken Time  BP 101/61   Temp 98   Pulse 74 03/30/24 18:25  Resp 24 03/30/24 18:25  SpO2 96 % 03/30/24 18:25  Vitals shown include unfiled device data.  Last Pain:  Vitals:   03/30/24 1648  TempSrc: Oral  PainSc: 1       Patients Stated Pain Goal: 7 (03/30/24 1648)  Complications: No notable events documented.

## 2024-03-30 NOTE — Patient Instructions (Signed)
 F/U in 3 months  You need to go to the eD for further evaluatgion today after you leave here     I have called over to the Provider at the ED so they are aware you are on  our way  Thanks for choosing [facility], we consider it a privelige to serve you.

## 2024-03-30 NOTE — Telephone Encounter (Signed)
 Appt made.

## 2024-03-30 NOTE — Anesthesia Preprocedure Evaluation (Signed)
 Anesthesia Evaluation  Patient identified by MRN, date of birth, ID band Patient awake    Reviewed: Allergy & Precautions, H&P , NPO status , Patient's Chart, lab work & pertinent test results, reviewed documented beta blocker date and time   History of Anesthesia Complications (+) history of anesthetic complications  Airway Mallampati: II  TM Distance: >3 FB Neck ROM: full    Dental no notable dental hx.    Pulmonary neg pulmonary ROS, former smoker   Pulmonary exam normal breath sounds clear to auscultation       Cardiovascular Exercise Tolerance: Good hypertension,  Rhythm:regular Rate:Normal     Neuro/Psych  PSYCHIATRIC DISORDERS Anxiety Depression    negative neurological ROS     GI/Hepatic Neg liver ROS, hiatal hernia,GERD  ,,  Endo/Other    Class 3 obesity  Renal/GU Renal disease  negative genitourinary   Musculoskeletal   Abdominal   Peds  Hematology  (+) Blood dyscrasia, anemia   Anesthesia Other Findings   Reproductive/Obstetrics negative OB ROS                              Anesthesia Physical Anesthesia Plan  ASA: 3 and emergent  Anesthesia Plan: General and General ETT   Post-op Pain Management:    Induction:   PONV Risk Score and Plan: Ondansetron   Airway Management Planned:   Additional Equipment:   Intra-op Plan:   Post-operative Plan:   Informed Consent: I have reviewed the patients History and Physical, chart, labs and discussed the procedure including the risks, benefits and alternatives for the proposed anesthesia with the patient or authorized representative who has indicated his/her understanding and acceptance.     Dental Advisory Given  Plan Discussed with: CRNA  Anesthesia Plan Comments:         Anesthesia Quick Evaluation

## 2024-03-30 NOTE — ED Notes (Signed)
 Patient transported to CT

## 2024-03-30 NOTE — H&P (Signed)
 Caitlin Sampson is an 63 y.o. female.   Chief Complaint: right lower quadrant abdominal pain  HPI: Patient is a 63 year old white female who was sent by her primary care physician for workup of right lower quadrant abdominal pain.  CT scan of the abdomen reveals acute appendicitis without perforation.  Patient states she has been feeling unwell over the past 24 hours.  The pain is now primarily in the right lower quadrant.  She initially thought she was having diverticulitis.  She has a decreased appetite.  Past Medical History:  Diagnosis Date   Anxiety    Carcinoid tumor (HCC)    Complication of anesthesia    felt strangled when woke up   Diverticulitis    Diverticulosis of colon 2012   GERD (gastroesophageal reflux disease)    History of hiatal hernia    History of kidney stones    Hyperlipidemia    Hypertension    IDA (iron deficiency anemia)    hx of iron def.   Poison oak dermatitis 2012    Past Surgical History:  Procedure Laterality Date   ABDOMINAL HYSTERECTOMY     CARDIAC CATHETERIZATION  09/05/03   EF 57%. NORMAL CORONARY ARTERIES. NO EVIDENCE OF RENAL ARTERY STENOSIS.   CHOLECYSTECTOMY     COLONOSCOPY N/A 06/14/2018   Procedure: COLONOSCOPY;  Surgeon: Golda Claudis PENNER, MD;  Location: [facility];  Service: Endoscopy;  Laterality: N/A;  730   ESOPHAGOGASTRODUODENOSCOPY N/A 10/17/2015   Procedure: ESOPHAGOGASTRODUODENOSCOPY (EGD);  Surgeon: Claudis PENNER Golda, MD;  Location: [facility];  Service: Endoscopy;  Laterality: N/A;  3:00   LAPAROSCOPIC GASTRIC SLEEVE RESECTION N/A 05/05/2017   Procedure: LAPAROSCOPIC GASTRIC SLEEVE RESECTION, UPPER ENDO;  Surgeon: Gladis Cough, MD;  Location: [facility];  Service: General;  Laterality: N/A;   MYOCARDIAL PERFUSION STUDY  03/30/09   NORMAL. EF 76%.   sleeve gastrectomy     05-05-18   TRANSTHORACIC ECHOCARDIOGRAM  10/10/08   NORMAL. EF => 55%.    Family History  Problem Relation Age of Onset   Heart disease Mother        a fib    Kidney disease Mother    Lung cancer Mother    Heart disease Father        MI   Hyperlipidemia Father    Hypertension Father    Kidney disease Father    Kidney disease Brother    Diabetes Maternal Grandmother    Stroke Maternal Grandmother    Colon cancer Other    Esophageal cancer Neg Hx    Rectal cancer Neg Hx    Stomach cancer Neg Hx    Social History:  reports that she has quit smoking. She has never used smokeless tobacco. She reports that she does not drink alcohol and does not use drugs.  Allergies:  Allergies  Allergen Reactions   Ivp Dye [Iodinated Contrast Media] Itching    (Not in a hospital admission)   Results for orders placed or performed during the hospital encounter of 03/30/24 (from the past 48 hours)  Urinalysis, Routine w reflex microscopic -Urine, Clean Catch     Status: None   Collection Time: 03/30/24 12:01 PM  Result Value Ref Range   Color, Urine YELLOW YELLOW   APPearance CLEAR CLEAR   Specific Gravity, Urine 1.017 1.005 - 1.030   pH 7.0 5.0 - 8.0   Glucose, UA NEGATIVE NEGATIVE mg/dL   Hgb urine dipstick NEGATIVE NEGATIVE   Bilirubin Urine NEGATIVE NEGATIVE  Ketones, ur NEGATIVE NEGATIVE mg/dL   Protein, ur NEGATIVE NEGATIVE mg/dL   Nitrite NEGATIVE NEGATIVE   Leukocytes,Ua NEGATIVE NEGATIVE    Comment: Performed at [facility], [address], [place], [place] [address]  Lipase, blood     Status: None   Collection Time: 03/30/24  1:31 PM  Result Value Ref Range   Lipase 15 11 - 51 U/L    Comment: Performed at [facility], [address] [place], [place] [address]  Comprehensive metabolic panel     Status: Abnormal   Collection Time: 03/30/24  1:31 PM  Result Value Ref Range   Sodium 135 135 - 145 mmol/L   Potassium 3.8 3.5 - 5.1 mmol/L   Chloride 98 98 - 111 mmol/L   CO2 25 22 - 32 mmol/L   Glucose, Bld 118 (H) 70 - 99 mg/dL    Comment: Glucose reference range applies only to samples taken after fasting for at least 8 hours.    BUN 8 8 - 23 mg/dL   Creatinine, Ser 9.42 0.44 - 1.00 mg/dL   Calcium  10.6 (H) 8.9 - 10.3 mg/dL   Total Protein 7.0 6.5 - 8.1 g/dL   Albumin 4.4 3.5 - 5.0 g/dL   AST 41 15 - 41 U/L   ALT 49 (H) 0 - 44 U/L   Alkaline Phosphatase 77 38 - 126 U/L   Total Bilirubin 0.4 0.0 - 1.2 mg/dL   GFR, Estimated >39 >39 mL/min    Comment: (NOTE) Calculated using the CKD-EPI Creatinine Equation (2021)    Anion gap 12 5 - 15    Comment: Performed at [facility], [address], [place], [place] [address]  CBC     Status: Abnormal   Collection Time: 03/30/24  1:31 PM  Result Value Ref Range   WBC 14.6 (H) 4.0 - 10.5 K/uL   RBC 4.46 3.87 - 5.11 MIL/uL   Hemoglobin 12.5 12.0 - 15.0 g/dL   HCT 61.9 63.9 - 53.9 %   MCV 85.2 80.0 - 100.0 fL   MCH 28.0 26.0 - 34.0 pg   MCHC 32.9 30.0 - 36.0 g/dL   RDW 85.3 88.4 - 84.4 %   Platelets 366 150 - 400 K/uL   nRBC 0.0 0.0 - 0.2 %    Comment: Performed at [facility], [address] [place], [place] [address]   CT ABDOMEN PELVIS WO CONTRAST Result Date: 03/30/2024 CLINICAL DATA:  Right lower quadrant pain since last night. EXAM: CT ABDOMEN AND PELVIS WITHOUT CONTRAST TECHNIQUE: Multidetector CT imaging of the abdomen and pelvis was performed following the standard protocol without IV contrast. RADIATION DOSE REDUCTION: This exam was performed according to the departmental dose-optimization program which includes automated exposure control, adjustment of the mA and/or kV according to patient size and/or use of iterative reconstruction technique. COMPARISON:  06/02/2016 FINDINGS: Lower chest: Heart is normal size. Visualized lung bases demonstrate right basilar atelectasis adjacent the elevated hemidiaphragm which is unchanged. Hepatobiliary: Previous cholecystectomy. Liver and biliary tree are unremarkable. Pancreas: Normal. Spleen: Normal. Adrenals/Urinary Tract: Adrenal glands are normal. Kidneys are normal in size without hydronephrosis. Punctate nonobstructing  stone over the upper pole right kidney. No focal renal mass. Ureters and bladder are normal. Stomach/Bowel: Surgical changes over the stomach compatible with gastric sleeve procedure. Small bowel is normal. Appendix is dilated over its tip measuring 9-10 mm. There is associated inflammatory change in the periappendiceal fat. No evidence of perforation or periappendiceal abscess. Findings are compatible with mild acute appendicitis.  There is moderate diverticulosis of the colon most prominent over the descending and sigmoid colon. Vascular/Lymphatic: Mild calcified plaque over the abdominal aorta which is normal caliber. No adenopathy. Reproductive: Status post hysterectomy. No adnexal masses. Other: No significant free peritoneal fluid. Musculoskeletal: No focal abnormality. IMPRESSION: 1. Evidence of mild acute appendicitis without perforation or periappendiceal abscess. 2. Moderate diverticulosis of the colon most prominent over the descending and sigmoid colon. 3. Punctate nonobstructing right renal stone. 4. Aortic atherosclerosis. Aortic Atherosclerosis (ICD10-I70.0). Critical Value/emergent results were called by telephone at the time of interpretation on 03/30/2024 at 3:42 pm to provider ALEXIS YOUNG , who verbally acknowledged these results. Electronically Signed   By: Toribio Agreste M.D.   On: 03/30/2024 15:46    Review of Systems  Constitutional:  Positive for fatigue.  HENT: Negative.    Eyes: Negative.   Respiratory: Negative.    Cardiovascular: Negative.   Gastrointestinal:  Positive for abdominal pain.  Endocrine: Negative.   Genitourinary: Negative.   Musculoskeletal: Negative.   Allergic/Immunologic: Negative.   Neurological: Negative.   Hematological: Negative.   Psychiatric/Behavioral: Negative.      Blood pressure 109/77, pulse 90, temperature 98.5 F (36.9 C), temperature source Oral, resp. rate 18, height 5' 2 (1.575 m), weight 105.2 kg, SpO2 93%. Physical Exam Vitals  reviewed.  Constitutional:      Appearance: She is well-developed. She is not ill-appearing.  HENT:     Head: Normocephalic and atraumatic.  Cardiovascular:     Rate and Rhythm: Regular rhythm.     Heart sounds: Normal heart sounds. No murmur heard.    No friction rub. No gallop.  Pulmonary:     Effort: Pulmonary effort is normal. No respiratory distress.     Breath sounds: Normal breath sounds. No stridor. No wheezing, rhonchi or rales.  Abdominal:     General: There is no distension.     Palpations: Abdomen is soft.     Tenderness: There is abdominal tenderness in the right lower quadrant. There is guarding. There is no rebound. Positive signs include Murphy's sign.     Hernia: No hernia is present.  Skin:    General: Skin is warm and dry.  Neurological:     Mental Status: She is alert and oriented to person, place, and time.      Assessment/Plan Impression: Acute appendicitis Plan: Patient will be brought to the operating room for robotic assisted laparoscopic appendectomy.  The risks and benefits of the procedure including bleeding, infection, and the possibility of an open procedure were fully explained to the patient, who gave informed consent.  [name], MD 03/30/2024, 4:29 PM

## 2024-03-30 NOTE — Interval H&P Note (Signed)
 History and Physical Interval Note:  03/30/2024 4:34 PM  Caitlin Sampson  has presented today for surgery, with the diagnosis of acute appendicitis.  The various methods of treatment have been discussed with the patient and family. After consideration of risks, benefits and other options for treatment, the patient has consented to  Procedure(s): APPENDECTOMY, ROBOT-ASSISTED, LAPAROSCOPIC (N/A) as a surgical intervention.  The patient's history has been reviewed, patient examined, no change in status, stable for surgery.  I have reviewed the patient's chart and labs.  Questions were answered to the patient's satisfaction.     [name]

## 2024-03-30 NOTE — Assessment & Plan Note (Signed)
 Controlled, no change in medication

## 2024-03-30 NOTE — Op Note (Signed)
 Patient:  Caitlin Sampson  DOB:  04/03/1961  MRN:  [id number]   Preop Diagnosis: Acute appendicitis  Postop Diagnosis: Same  Procedure: Robotic assisted laparoscopic appendectomy  Surgeon: [name], MD  Anes: General Endotracheal  Indications: Patient is a 63 year old white female who presents with right lower quadrant abdominal pain.  CT scan of the abdomen reveals acute appendicitis.  The patient is being taken to the operating room for robotic assisted laparoscopic appendectomy.  The risks and benefits of the procedure including bleeding, infection, and the possibility of an open procedure were fully explained to the patient, who gave informed consent.  Procedure note: The patient was placed in the supine position.  After induction of general endotracheal anesthesia, the abdomen was prepped and draped using the usual sterile technique with ChloraPrep.  Surgical site confirmation was performed.  An incision was made in the left upper quadrant at Palmer's point.  A Veress needle was introduced into the abdominal cavity and confirmation of placement was done using the saline drop test.  The abdomen was then insufflated to 15 mmHg pressure.  An 8 mm trocar was introduced into the abdominal cavity under direct visualization without difficulty.  Additional 8 mm trocars were placed on the left flank and left lower quadrant regions.  The robot was then docked and targeted.  The patient had an elongated appendix with the distal tip inflamed.  The mesoappendix was divided using the vessel sealer.  This was taken up to the juncture of the appendix to the cecum.  A white load sure fire Endo GIA was placed across the base of the appendix and fired.  The staple line was inspected and noted to be within normal limits.  The appendix was removed using an Endo Catch bag without difficulty.  No bleeding was noted at the end of the procedure.  The robot was undocked and all air and fluid were evacuated from the  abdominal cavity prior to removal of the trocars.  All wounds were irrigated with normal saline.  All wounds were injected with 0.5% Sensorcaine .  All incisions were closed using a 4-0 Monocryl subcuticular suture.  Dermabond was applied.  All tape and needle counts were correct at the end of the procedure.  The patient was extubated in the operating room and transferred to PACU in stable condition.  Complications: None  EBL: Minimal  Specimen: Appendix

## 2024-03-30 NOTE — Telephone Encounter (Signed)
 FYI Only or Action Required?: FYI only for provider: appointment scheduled on 11/12.  Patient was last seen in primary care on 05/06/2023 by [name], MD.  Called Nurse Triage reporting Abdominal Pain.  Symptoms began yesterday.  Interventions attempted: Rest, hydration, or home remedies.  Symptoms are: unchanged.  Triage Disposition: See HCP Within 4 Hours (Or PCP Triage)  Patient/caregiver understands and will follow disposition?: Yes         Copied from CRM [id number]. Topic: Clinical - Red Word Triage >> Mar 30, 2024  9:10 AM Caitlin Sampson wrote: Red Word that prompted transfer to Nurse Triage: Stomach pain but doesnt know if its divirticulitis flare up. Patient's patient is swollen, bloated, nauseous. Reason for Disposition  [1] MILD-MODERATE pain AND [2] constant AND [3] age > 60 years  Answer Assessment - Initial Assessment Questions 1. LOCATION: Where does it hurt?      Right side, lower   2. RADIATION: Does the pain shoot anywhere else? (e.g., chest, back)     Back   3. ONSET: When did the pain begin? (e.g., minutes, hours or days ago)      X 1 day   4. SUDDEN: Gradual or sudden onset?      Sudden onset  5. PATTERN Does the pain come and go, or is it constant?      Intermittent   6. SEVERITY: How bad is the pain?  (e.g., Scale 1-10; mild, moderate, or severe)     7/10  7. RECURRENT SYMPTOM: Have you ever had this type of stomach pain before? If Yes, ask: When was the last time? and What happened that time?      Yes, divirticulitis flare up  8. CAUSE: What do you think is causing the stomach pain? (e.g., gallstones, recent abdominal surgery)      divirticulitis   9. RELIEVING/AGGRAVATING FACTORS: What makes it better or worse? (e.g., antacids, bending or twisting motion, bowel movement)     Nothing   10. OTHER SYMPTOMS: Do you have any other symptoms? (e.g., back pain, diarrhea, fever, urination pain, vomiting)  Swelling  in abdomen, bloating, nausea noted. Appointment scheduled for evaluation. Patient agrees with plan of care, and will call back if anything changes, or if symptoms worsen.  Protocols used: Abdominal Pain - Female-A-AH

## 2024-03-30 NOTE — Assessment & Plan Note (Addendum)
 Burning epigastric pain with nausea, has hiatal hernia, further eval in ED needed, no med administered in office

## 2024-03-30 NOTE — Anesthesia Procedure Notes (Signed)
 Procedure Name: Intubation Date/Time: 03/30/2024 5:16 PM  Performed by: Kendell Yvonna PARAS, MDPre-anesthesia Checklist: Patient identified, Emergency Drugs available, Suction available, Patient being monitored and Timeout performed Patient Re-evaluated:Patient Re-evaluated prior to induction Oxygen Delivery Method: Circle system utilized Preoxygenation: Pre-oxygenation with 100% oxygen Induction Type: IV induction Laryngoscope Size: Glidescope and 4 Grade View: Grade III Tube type: Oral Tube size: 7.0 mm Number of attempts: 1 Airway Equipment and Method: Video-laryngoscopy and Stylet Placement Confirmation: ETT inserted through vocal cords under direct vision, positive ETCO2 and breath sounds checked- equal and bilateral Secured at: 22 cm Tube secured with: Tape Dental Injury: Teeth and Oropharynx as per pre-operative assessment

## 2024-03-30 NOTE — ED Provider Notes (Signed)
 [place] [facility] AT [facility] Provider Note   CSN: [id number] Arrival date & time: 03/30/24  1130     Patient presents with: Abdominal Pain   Caitlin Sampson is a 63 y.o. female.    Abdominal Pain Associated symptoms: nausea   Associated symptoms: no chest pain, no chills, no constipation, no diarrhea, no fever, no shortness of breath and no vomiting    Patient is a 63 year old female with a history of previous kidney stones, diverticulitis, rectal cancer who presents to the ED from PCPs office for lower abdominal pain and nausea that began late last night.  Patient notes she started to have some cramping pains on the left side of the abdomen that has progressed into the lower abdomen pain.  She states she went to her PCPs office this morning and was sent to the ED for further evaluation.  She notes she has been consistently nauseous.  Notes a recent viral-like illness with cough and congestion that has improved.  States she has a history of hysterectomy, gastric sleeve, and cholecystectomy but no recent surgeries.  No fevers, chest pain, shortness of breath, vomiting, diarrhea/constipation, hematochezia.  No further complaints.    Prior to Admission medications   Medication Sig Start Date End Date Taking? Authorizing Provider  amLODipine  (NORVASC ) 5 MG tablet Take 1 tablet (5 mg total) by mouth daily. Patient taking differently: Take 5 mg by mouth at bedtime. 02/15/24  Yes [name], MD  aspirin  EC 81 MG tablet Take 81 mg by mouth at bedtime. Swallow whole.   Yes [provider]  fenofibrate  (TRICOR ) 48 MG tablet Take 1 tablet (48 mg total) by mouth daily. Patient taking differently: Take 48 mg by mouth at bedtime. 02/15/24  Yes [name], MD  FLUoxetine  (PROZAC ) 40 MG capsule Take 1 capsule (40 mg total) by mouth daily. Patient taking differently: Take 40 mg by mouth at bedtime. 02/18/24  Yes [name], MD   losartan -hydrochlorothiazide  (HYZAAR) 100-25 MG tablet Take 1 tablet by mouth daily. Patient taking differently: Take 1 tablet by mouth at bedtime. 02/15/24  Yes [name], MD  pantoprazole  (PROTONIX ) 20 MG tablet Take 1 tablet (20 mg total) by mouth daily. Patient taking differently: Take 20 mg by mouth at bedtime. 02/15/24  Yes [name], MD  Phenyleph-Doxylamine-DM-APAP (TYLENOL  COLD/FLU/COUGH NIGHT) 5-6.25-10-325 MG/15ML LIQD Take 15 mLs by mouth at bedtime as needed (cough/cold symptoms).   Yes [provider]  Phenylephrine-APAP-guaiFENesin (TYLENOL  SINUS SEVERE PO) Take 2 tablets by mouth daily as needed (congestion).   Yes [provider]  Probiotic Product (PROBIOTIC ADVANCED PO) Take 1 capsule by mouth daily.   Yes [provider]  rosuvastatin  (CRESTOR ) 20 MG tablet TAKE 1/2 TABLET 2 TIMES A  WEEK Patient taking differently: Take 10 mg by mouth 2 (two) times a week. TAKE 1/2 TABLET 2 TIMES A  WEEK 08/04/23  Yes [name], MD  simethicone  (MYLICON) 80 MG chewable tablet Chew 160 mg by mouth every 6 (six) hours as needed for flatulence.   Yes [provider]    Allergies: Ivp dye [iodinated contrast media]    Review of Systems  Constitutional:  Negative for chills and fever.  Respiratory:  Negative for shortness of breath.   Cardiovascular:  Negative for chest pain.  Gastrointestinal:  Positive for abdominal pain and nausea. Negative for constipation, diarrhea and vomiting.  Neurological:  Negative for headaches.    Updated Vital Signs BP  133/75   Pulse 92   Temp 98.5 F (36.9 C) (Oral)   Resp 16   Ht 5' 2 (1.575 m)   Wt 105.2 kg   SpO2 93%   BMI 42.43 kg/m   Physical Exam Constitutional:      Appearance: She is well-developed.  HENT:     Head: Normocephalic and atraumatic.     Mouth/Throat:     Mouth: Mucous membranes are moist.     Pharynx: Oropharynx is clear.  Cardiovascular:     Rate and Rhythm:  Normal rate.  Pulmonary:     Effort: Pulmonary effort is normal.  Abdominal:     General: Bowel sounds are normal.     Palpations: Abdomen is soft.     Tenderness: There is abdominal tenderness in the right lower quadrant and left lower quadrant.  Neurological:     Mental Status: She is alert.     (all labs ordered are listed, but only abnormal results are displayed) Labs Reviewed  COMPREHENSIVE METABOLIC PANEL WITH GFR - Abnormal; Notable for the following components:      Result Value   Glucose, Bld 118 (*)    Calcium  10.6 (*)    ALT 49 (*)    All other components within normal limits  CBC - Abnormal; Notable for the following components:   WBC 14.6 (*)    All other components within normal limits  LIPASE, BLOOD  URINALYSIS, ROUTINE W REFLEX MICROSCOPIC    EKG: None  Radiology: CT ABDOMEN PELVIS WO CONTRAST Result Date: 03/30/2024 CLINICAL DATA:  Right lower quadrant pain since last night. EXAM: CT ABDOMEN AND PELVIS WITHOUT CONTRAST TECHNIQUE: Multidetector CT imaging of the abdomen and pelvis was performed following the standard protocol without IV contrast. RADIATION DOSE REDUCTION: This exam was performed according to the departmental dose-optimization program which includes automated exposure control, adjustment of the mA and/or kV according to patient size and/or use of iterative reconstruction technique. COMPARISON:  06/02/2016 FINDINGS: Lower chest: Heart is normal size. Visualized lung bases demonstrate right basilar atelectasis adjacent the elevated hemidiaphragm which is unchanged. Hepatobiliary: Previous cholecystectomy. Liver and biliary tree are unremarkable. Pancreas: Normal. Spleen: Normal. Adrenals/Urinary Tract: Adrenal glands are normal. Kidneys are normal in size without hydronephrosis. Punctate nonobstructing stone over the upper pole right kidney. No focal renal mass. Ureters and bladder are normal. Stomach/Bowel: Surgical changes over the stomach compatible  with gastric sleeve procedure. Small bowel is normal. Appendix is dilated over its tip measuring 9-10 mm. There is associated inflammatory change in the periappendiceal fat. No evidence of perforation or periappendiceal abscess. Findings are compatible with mild acute appendicitis. There is moderate diverticulosis of the colon most prominent over the descending and sigmoid colon. Vascular/Lymphatic: Mild calcified plaque over the abdominal aorta which is normal caliber. No adenopathy. Reproductive: Status post hysterectomy. No adnexal masses. Other: No significant free peritoneal fluid. Musculoskeletal: No focal abnormality. IMPRESSION: 1. Evidence of mild acute appendicitis without perforation or periappendiceal abscess. 2. Moderate diverticulosis of the colon most prominent over the descending and sigmoid colon. 3. Punctate nonobstructing right renal stone. 4. Aortic atherosclerosis. Aortic Atherosclerosis (ICD10-I70.0). Critical Value/emergent results were called by telephone at the time of interpretation on 03/30/2024 at 3:42 pm to provider Albirda Shiel , who verbally acknowledged these results. Electronically Signed   By: Toribio Agreste M.D.   On: 03/30/2024 15:46       Medications Ordered in the ED  predniSONE  (DELTASONE ) tablet 50 mg ( Oral Automatically Held 03/31/24 0145)  cefTRIAXone  (ROCEPHIN ) 2 g in sodium chloride  0.9 % 100 mL IVPB (2 g Intravenous New Bag/Given 03/30/24 1638)  metroNIDAZOLE  (FLAGYL ) IVPB 500 mg ( Intravenous MAR Hold 03/30/24 1637)  Chlorhexidine  Gluconate Cloth 2 % PADS 6 each (has no administration in time range)    And  Chlorhexidine  Gluconate Cloth 2 % PADS 6 each (has no administration in time range)  morphine  (PF) 4 MG/ML injection 4 mg (4 mg Intravenous Given 03/30/24 1327)  ondansetron  (ZOFRAN ) injection 4 mg (4 mg Intravenous Given 03/30/24 1327)  diphenhydrAMINE (BENADRYL) capsule 50 mg (50 mg Oral Given 03/30/24 1342)                                    Medical Decision Making Amount and/or Complexity of Data Reviewed Labs: ordered. Radiology: ordered.  Risk Prescription drug management. Decision regarding hospitalization.   Patient is a 63 year old female who presents to the ED for lower abdominal pain that began last evening and has worsened today.  Please see dictated HPI above.  On exam patient is alert and in no acute distress.  Physical exam as noted above.  Lower abdominal tenderness is appreciated with localization to the right lower quadrant.  Labs significant for leukocytosis of 14.6.  Electrolytes otherwise unremarkable.  Patient was initially given prednisone  and Benadryl due to noted contrast allergy.  However we did agree to proceed with scan due to acuity.  CT abdomen pelvis without contrast does show acute appendicitis.  Patient has been given morphine  and Zofran  while in the ED.  General surgery was consulted who were agreeable to consult on patient.  They did recommend IV Rocephin  and Flagyl , patient started on antibiotics while in ED.  Patient currently stable in ED and will be admitted to general surgery for further evaluation and management.  I discussed this case with my attending physician who cosigned this note including patient's presenting symptoms, physical exam, and planned diagnostics and interventions. Attending physician stated agreement with plan or made changes to plan which were implemented.   Attending physician assessed patient at bedside.    Final diagnoses:  Acute appendicitis, unspecified acute appendicitis type    ED Discharge Orders     None          Neysa Thersia RAMAN, [place] 03/30/24 1650    Franklyn Sid SAILOR, MD 03/31/24 1744

## 2024-03-30 NOTE — Progress Notes (Signed)
   Caitlin Sampson     MRN: [id number]      DOB: 1961-05-07  Chief Complaint  Patient presents with   Abdominal Pain    Pt reports abdominal pain, bloating, and nausea since last night. Thinks it could be diverticulitis flare up. Denies vomiting and diarrhea     HPI Caitlin Sampson is here c/o bilateral lower abdominal pain, right worse than left which started acutely overnight, along with chills and nausea, no change in stool, no blood or mucus in stool Has left sided diverticulosis, states pain radiaites to back C/o central abdominal pain and burning also since symptom onset Feels bad ROS See HPI  Denies recent fever or chills. Denies dysuria or frequency, denies runny nose or cough    PE  BP 130/75   Pulse 78   Temp 98.7 F (37.1 C)   Resp 18   Ht 5' 2 (1.575 m)   Wt 232 lb 1.9 oz (105.3 kg)   SpO2 98%   BMI 42.46 kg/m   Patient alert  in pain and looks ill HEENT: No facial asymmetry, EOMI,     Neck supple .  Chest: Clear to auscultation bilaterally.  CVS: S1, S2 no murmurs, no S3.Regular rate.  ABD:  epigastric and bilateral lower abdominal tenderness with guarding and rebound in RLQ Ext: No edema  MS: Adequate ROM spine, shoulders, hips and knees.  Skin: Intact, no ulcerations or rash noted.  Psych: Good eye contact, normal affect. Memory intact not anxious or depressed appearing.  CNS: CN 2-12 intact, power,  normal throughout.no focal deficits noted.   Assessment & Plan  Chills Afebrile, concern re diverticulitis and appendicitis, send to ED  Acute abdominal pain Differential includes acute diverticulitis and acute appendicitis, needs ed eval  Essential hypertension Controlled, no change in medication   Nausea Burning epigastric pain with nausea, has hiatal hernia, further eval in ED needed, no med administered in office

## 2024-03-30 NOTE — ED Notes (Signed)
 Per provider, antibiotics sent with pt and nurse to PACU.

## 2024-03-30 NOTE — Assessment & Plan Note (Signed)
 Differential includes acute diverticulitis and acute appendicitis, needs ed eval

## 2024-03-30 NOTE — ED Triage Notes (Signed)
 Pt sent by PCP for evaluation of abdominal pain since last night, PMH includes Diverticulitis.

## 2024-03-30 NOTE — Assessment & Plan Note (Signed)
 Afebrile, concern re diverticulitis and appendicitis, send to ED

## 2024-03-30 NOTE — ED Notes (Signed)
Pt transported to PACU 

## 2024-03-30 NOTE — Anesthesia Postprocedure Evaluation (Signed)
 Anesthesia Post Note  Patient: Caitlin Sampson  Procedure(s) Performed: APPENDECTOMY, ROBOT-ASSISTED, LAPAROSCOPIC  Anesthesia Type: General Anesthetic complications: no   No notable events documented.   Last Vitals:  Vitals:   03/30/24 1615 03/30/24 1648  BP: 109/77 133/75  Pulse: 90 92  Resp: 18 16  Temp:  36.9 C  SpO2: 93% 93%    Last Pain:  Vitals:   03/30/24 1648  TempSrc: Oral  PainSc: 1                  [name]

## 2024-03-31 ENCOUNTER — Encounter (HOSPITAL_COMMUNITY): Payer: Self-pay | Admitting: General Surgery

## 2024-04-01 LAB — SURGICAL PATHOLOGY

## 2024-04-04 ENCOUNTER — Ambulatory Visit

## 2024-04-06 ENCOUNTER — Ambulatory Visit: Admitting: General Surgery

## 2024-04-06 ENCOUNTER — Encounter: Payer: Self-pay | Admitting: General Surgery

## 2024-04-06 DIAGNOSIS — Z09 Encounter for follow-up examination after completed treatment for conditions other than malignant neoplasm: Secondary | ICD-10-CM

## 2024-04-06 DIAGNOSIS — K353 Acute appendicitis with localized peritonitis, without perforation or gangrene: Secondary | ICD-10-CM

## 2024-04-06 NOTE — Progress Notes (Signed)
 Subjective:     Caitlin Sampson  Virtual postoperative telephone visit performed with patient.  I was in the office.  She states she is doing great.  She has no complaints.  She did not have to take her pain medications as prescribed.  She has had a colonoscopy earlier this year.  She does have a history of diverticulosis. Objective:    There were no vitals taken for this visit.  General:  alert, cooperative, and no distress  Final pathology consistent with diagnosis.     Assessment:    Doing well postoperatively.    Plan:   Patient was told to call me should any problems arise.  As this was a part of the total global surgical fee, this was not a billable visit.  Total telephone time was 3 minutes.

## 2024-04-15 ENCOUNTER — Ambulatory Visit: Payer: Self-pay

## 2024-04-26 ENCOUNTER — Other Ambulatory Visit: Payer: Self-pay | Admitting: Family Medicine

## 2024-06-30 ENCOUNTER — Ambulatory Visit: Admitting: Family Medicine

## 2024-07-20 ENCOUNTER — Ambulatory Visit: Admitting: Family Medicine
